# Patient Record
Sex: Female | Born: 1995 | Race: Black or African American | Hispanic: No | Marital: Single | Smoking: Current every day smoker
Health system: Southern US, Community
[De-identification: ages and names within clinical notes are randomized; demographics above are authoritative.]

## PROBLEM LIST (undated history)

## (undated) ENCOUNTER — Inpatient Hospital Stay (HOSPITAL_COMMUNITY): Payer: Self-pay

## (undated) DIAGNOSIS — Z789 Other specified health status: Secondary | ICD-10-CM

## (undated) DIAGNOSIS — F411 Generalized anxiety disorder: Secondary | ICD-10-CM

## (undated) DIAGNOSIS — F32A Depression, unspecified: Secondary | ICD-10-CM

## (undated) DIAGNOSIS — O24419 Gestational diabetes mellitus in pregnancy, unspecified control: Secondary | ICD-10-CM

## (undated) DIAGNOSIS — K2960 Other gastritis without bleeding: Secondary | ICD-10-CM

## (undated) DIAGNOSIS — N390 Urinary tract infection, site not specified: Secondary | ICD-10-CM

## (undated) DIAGNOSIS — S93401A Sprain of unspecified ligament of right ankle, initial encounter: Secondary | ICD-10-CM

## (undated) DIAGNOSIS — E282 Polycystic ovarian syndrome: Secondary | ICD-10-CM

## (undated) HISTORY — DX: Sprain of unspecified ligament of right ankle, initial encounter: S93.401A

## (undated) HISTORY — DX: Generalized anxiety disorder: F41.1

## (undated) HISTORY — DX: Other gastritis without bleeding: K29.60

## (undated) HISTORY — DX: Polycystic ovarian syndrome: E28.2

---
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
## 2016-01-04 ENCOUNTER — Encounter: Payer: Self-pay | Admitting: Pediatrics

## 2016-01-04 ENCOUNTER — Ambulatory Visit: Payer: Self-pay | Admitting: Pediatrics

## 2016-01-04 ENCOUNTER — Ambulatory Visit (INDEPENDENT_AMBULATORY_CARE_PROVIDER_SITE_OTHER): Payer: Medicaid Other | Admitting: Pediatrics

## 2016-01-04 VITALS — BP 120/77 | HR 91 | Ht 67.72 in | Wt 280.8 lb

## 2016-01-04 DIAGNOSIS — E282 Polycystic ovarian syndrome: Secondary | ICD-10-CM | POA: Diagnosis not present

## 2016-01-04 DIAGNOSIS — Z3046 Encounter for surveillance of implantable subdermal contraceptive: Secondary | ICD-10-CM | POA: Diagnosis not present

## 2016-01-04 DIAGNOSIS — E559 Vitamin D deficiency, unspecified: Secondary | ICD-10-CM | POA: Diagnosis not present

## 2016-01-04 DIAGNOSIS — Z30017 Encounter for initial prescription of implantable subdermal contraceptive: Secondary | ICD-10-CM

## 2016-01-04 DIAGNOSIS — R7303 Prediabetes: Secondary | ICD-10-CM | POA: Diagnosis not present

## 2016-01-04 DIAGNOSIS — Z3049 Encounter for surveillance of other contraceptives: Secondary | ICD-10-CM

## 2016-01-04 LAB — LIPID PANEL
CHOL/HDL RATIO: 4.4 ratio (ref <=5.0)
Cholesterol: 192 mg/dL — ABNORMAL HIGH (ref 125–170)
HDL: 44 mg/dL (ref 36–76)
LDL CALC: 116 mg/dL — AB (ref <110)
Triglycerides: 159 mg/dL — ABNORMAL HIGH (ref 40–136)
VLDL: 32 mg/dL — AB (ref <30)

## 2016-01-04 LAB — CBC
HEMATOCRIT: 38.6 % (ref 35.0–45.0)
HEMOGLOBIN: 12.7 g/dL (ref 11.7–15.5)
MCH: 28.2 pg (ref 27.0–33.0)
MCHC: 32.9 g/dL (ref 32.0–36.0)
MCV: 85.8 fL (ref 80.0–100.0)
MPV: 8.8 fL (ref 7.5–12.5)
Platelets: 286 10*3/uL (ref 140–400)
RBC: 4.5 MIL/uL (ref 3.80–5.10)
RDW: 15 % (ref 11.0–15.0)
WBC: 7.2 10*3/uL (ref 3.8–10.8)

## 2016-01-04 LAB — COMPREHENSIVE METABOLIC PANEL
ALT: 19 U/L (ref 5–32)
AST: 17 U/L (ref 12–32)
Albumin: 4 g/dL (ref 3.6–5.1)
Alkaline Phosphatase: 91 U/L (ref 47–176)
BUN: 13 mg/dL (ref 7–20)
CALCIUM: 9.3 mg/dL (ref 8.9–10.4)
CHLORIDE: 103 mmol/L (ref 98–110)
CO2: 26 mmol/L (ref 20–31)
Creat: 0.87 mg/dL (ref 0.50–1.00)
Glucose, Bld: 64 mg/dL — ABNORMAL LOW (ref 65–99)
POTASSIUM: 4.2 mmol/L (ref 3.8–5.1)
SODIUM: 138 mmol/L (ref 135–146)
TOTAL PROTEIN: 7.2 g/dL (ref 6.3–8.2)
Total Bilirubin: 0.2 mg/dL (ref 0.2–1.1)

## 2016-01-04 MED ORDER — ETONOGESTREL 68 MG ~~LOC~~ IMPL
68.0000 mg | DRUG_IMPLANT | Freq: Once | SUBCUTANEOUS | Status: AC
  Administered 2016-01-04: 68 mg via SUBCUTANEOUS

## 2016-01-04 NOTE — Procedures (Signed)
Risks & benefits of Nexplanon removal discussed. Consent form signed.  The patient denies any allergies to anesthetics or antiseptics.  Procedure: Pt was placed in supine position. left arm was flexed at the elbow and externally rotated so that her wrist was parallel to her ear, The device was palpated and marked. The site was cleaned with Betadine. The area surrounding the device was covered with a sterile drape. 1% lidocaine was injected just under the device. A scalpel was used to create a small incision. The device was pushed towards the incision. Fibrous tissue surrounding the device was gradually removed from the device. The device was removed and measured to ensure all 4 cm of device was removed.  Nexplanon Insertion  No contraindications for placement.  No liver disease, no unexplained vaginal bleeding, no h/o breast cancer, no h/o blood clots.  No LMP recorded. Patient has had an implant.  UHCG: Not done- remove and replace  Last Unprotected sex:  None  Risks & benefits of Nexplanon discussed The nexplanon device was purchased and supplied by [facility]. Packaging instructions supplied to patient Consent form signed  The patient denies any allergies to anesthetics or antiseptics.  Procedure: Pt was placed in supine position. The left arm was flexed at the elbow and externally rotated so that her wrist was parallel to her ear The medial epicondyle of the left arm was identified The insertions site was marked 8 cm proximal to the medial epicondyle The insertion site was cleaned with Betadine The area surrounding the insertion site was covered with a sterile drape 1% lidocaine was injected just under the skin at the insertion site extending 4 cm proximally. The sterile preloaded disposable Nexaplanon applicator was removed from the sterile packaging The applicator needle was inserted at a 30 degree angle at 8 cm proximal to the medial epicondyle as marked The applicator  was lowered to a horizontal position and advanced just under the skin for the full length of the needle The slider on the applicator was retracted fully while the applicator remained in the same position, then the applicator was removed. The implant was confirmed via palpation as being in position The implant position was demonstrated to the patient Pressure dressing was applied to the patient.  The patient was instructed to removed the pressure dressing in 24 hrs.  The patient was advised to move slowly from a supine to an upright position  The patient denied any concerns or complaints  The patient was instructed to schedule a follow-up appt in 1 month and to call sooner if any concerns.  The patient acknowledged agreement and understanding of the plan.

## 2016-01-04 NOTE — Patient Instructions (Addendum)
We drew labs today for your PCOS. In the past you were prediabetic so we will recheck this number today. It may be important for you to start taking your metformin again EVERY DAY. I will let you know. This could help with weight stabilization as well.      Congratulations on getting your Nexplanon placement!  Below is some important information about Nexplanon.  First remember that Nexplanon does not prevent sexually transmitted infections.  Condoms will help prevent sexually transmitted infections. The Nexplanon starts working 7 days after it was inserted.  There is a risk of getting pregnant if you have unprotected sex in those first 7 days after placement of the Nexplanon.  The Nexplanon lasts for 3 years but can be removed at any time.  You can become pregnant as early as 1 week after removal.  You can have a new Nexplanon put in after the old one is removed if you like.  It is not known whether Nexplanon is as effective in women who are very overweight because the studies did not include many overweight women.  Nexplanon interacts with some medications, including barbiturates, bosentan, carbamazepine, felbamate, griseofulvin, oxcarbazepine, phenytoin, rifampin, St. John's wort, topiramate, HIV medicines.  Please alert your doctor if you are on any of these medicines.  Always tell other healthcare providers that you have a Nexplanon in your arm.  The Nexplanon was placed just under the skin.  Leave the outside bandage on for 24 hours.  Leave the smaller bandage on for 3-5 days or until it falls off on its own.  Keep the area clean and dry for 3-5 days. There is usually bruising or swelling at the insertion site for a few days to a week after placement.  If you see redness or pus draining from the insertion site, call us immediately.  Keep your user card with the date the implant was placed and the date the implant is to be removed.  The most common side effect is a change in your  menstrual bleeding pattern.   This bleeding is generally not harmful to you but can be annoying.  Call or come in to see us if you have any concerns about the bleeding or if you have any side effects or questions.    We will call you in 1 week to check in and we would like you to return to the clinic for a follow-up visit in 1 month.  You can call [facility] 24 hours a day with any questions or concerns.  There is always a nurse or doctor available to take your call.  Call 9-1-1 if you have a life-threatening emergency.  For anything else, please call us at [phone] before heading to the ER.

## 2016-01-04 NOTE — Progress Notes (Signed)
THIS RECORD MAY CONTAIN CONFIDENTIAL INFORMATION THAT SHOULD NOT BE RELEASED WITHOUT REVIEW OF THE SERVICE PROVIDER.  Adolescent Medicine Consultation Follow-Up Visit Briana Lynch  is a 20 y.o. female referred by Kalman JewelsMcQueen, Shannon, MD here today for follow-up.    Previsit planning completed:  no  Growth Chart Viewed? yes   History was provided by the patient.  PCP Confirmed?  yes  My Chart Activated?   yes   HPI:    Patient presents for nexplanon removal and reinsertion. She does not currently have bleeding with nexplanon and is pleased with method. She is looking forward to having another. Procedure was completed without complications. See procedure note.   Patient also concerned that she can lose weight fairly quickly but then will gain it back. At times she has exercised and eaten well, at other times she hasn't changed much and her weight will fluctuate. She is only occasionally taking metformin.   Review of Systems  Constitutional: Negative for weight loss and malaise/fatigue.  Eyes: Negative for blurred vision.  Respiratory: Negative for shortness of breath.   Cardiovascular: Negative for chest pain and palpitations.  Gastrointestinal: Negative for nausea, vomiting, abdominal pain and constipation.  Genitourinary: Negative for dysuria.  Musculoskeletal: Negative for myalgias.  Neurological: Negative for dizziness and headaches.  Psychiatric/Behavioral: Negative for depression.     No LMP recorded. Patient has had an implant. Allergies  Allergen Reactions  . Penicillins Other (See Comments)    Unknown-pt's sister is allergic   Outpatient Prescriptions Prior to Visit  Medication Sig Dispense Refill  . etonogestrel (NEXPLANON) 68 MG IMPL implant Inject 1 each into the skin once.    . metFORMIN (GLUCOPHAGE-XR) 500 MG 24 hr tablet TAKE 3 TABLETS (1,000 MG TOTAL) BY MOUTH DAILY. 90 tablet 3   No facility-administered medications prior to visit.     Patient Active  Problem List   Diagnosis Date Noted  . 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 Iron deficiency anemia 10/19/2014  . Vitamin D deficiency 08/20/2014  . Prediabetes 08/20/2014  . Seborrhea capitis 05/18/2014  . PCOS (polycystic ovarian syndrome) 03/03/2013  . Presence of subdermal contraceptive device 03/03/2013  . Urinary incontinence 12/11/2012  . Irregular menses 12/09/2012  . Acne 12/09/2012  . Hirsutism 12/09/2012  . BMI (body mass index), pediatric, > 99% for age 62/27/2014    The following portions of the patient's history were reviewed and updated as appropriate: allergies, current medications, past family history, past medical history, past social history and problem list.  Physical Exam:  Filed Vitals:   01/04/16 1434  BP: 120/77  Pulse: 91  Height: 5' 7.72" (1.72 m)  Weight: 280 lb 12.8 oz (127.37 kg)   BP 120/77 mmHg  Pulse 91  Ht 5' 7.72" (1.72 m)  Wt 280 lb 12.8 oz (127.37 kg)  BMI 43.05 kg/m2 Body mass index: body mass index is 43.05 kg/(m^2). Blood pressure percentiles are 75% systolic and 84% diastolic based on 2000 NHANES data. Blood pressure percentile targets: 90: 126/80, 95: 130/84, 99 + 5 mmHg: 142/97.  Physical Exam  Constitutional: She is oriented to person, place, and time. She appears well-developed and well-nourished.  HENT:  Head: Normocephalic.  Neck: No thyromegaly present.  +1 acanthosis   Cardiovascular: Normal rate, regular rhythm, normal heart sounds and intact distal pulses.   Pulmonary/Chest: Effort normal and breath sounds normal.  Abdominal: Soft. Bowel sounds are normal. There is no tenderness.  Musculoskeletal: Normal range of motion.  Neurological: She is  alert and oriented to person, place, and time.  Skin: Skin is warm and dry.  Psychiatric: She has a normal mood and affect.    Assessment/Plan:  1. PCOS (polycystic ovarian syndrome) Will obtain labs today as patient hasn't had them done in a year. Will call or  send by my chart with plan, particularly regarding metformin.  - Comprehensive metabolic panel - Lipid panel - Hemoglobin A1c  2. Vitamin D deficiency Has taken replacement. Will recheck.  - VITAMIN D 25 Hydroxy (Vit-D Deficiency, Fractures)  3. Nexplanon removal See procedure note.   4. Nexplanon insertion See procedure note.  - etonogestrel (NEXPLANON) implant 68 mg; 68 mg by Subdermal route once. - Subdermal Etonogestrel Implant Insertion; Standing - Subdermal Etonogestrel Implant Insertion  5. Prediabetes Last A1C 6.1. Will recheck today and discuss ongoing need for metformin in the context of pre-DM and PCOS.  - CBC  PCOS Labs & Referrals:   - Hgba1c annually if normal, every 3 months if abnormal:  Due today - CMP annually if normal, as needed if abnormal:  Due today - CBC if on metformin, annually if normal, as needed if abnormal:  Due today - Lipid every 2 years if normal, annually if abnormal:  Due today - Vitamin D annually if normal, as needed if abnormal: Due today - Nutrition referral: Deferred    Follow-up:  6 months on Christmas break   Medical decision-making:  > 40 minutes spent, more than 50% of appointment was spent discussing diagnosis and management of symptoms

## 2016-01-05 LAB — HEMOGLOBIN A1C
Hgb A1c MFr Bld: 6.1 % — ABNORMAL HIGH (ref <5.7)
Mean Plasma Glucose: 128 mg/dL

## 2016-01-05 LAB — VITAMIN D 25 HYDROXY (VIT D DEFICIENCY, FRACTURES): Vit D, 25-Hydroxy: 19 ng/mL — ABNORMAL LOW (ref 30–100)

## 2016-02-09 ENCOUNTER — Encounter: Payer: Self-pay | Admitting: Pediatrics

## 2016-03-15 ENCOUNTER — Other Ambulatory Visit: Payer: Self-pay | Admitting: Pediatrics

## 2016-03-15 DIAGNOSIS — E282 Polycystic ovarian syndrome: Secondary | ICD-10-CM

## 2018-12-19 ENCOUNTER — Other Ambulatory Visit: Payer: Self-pay

## 2018-12-19 ENCOUNTER — Encounter (HOSPITAL_COMMUNITY): Payer: Self-pay | Admitting: *Deleted

## 2018-12-19 DIAGNOSIS — Z79899 Other long term (current) drug therapy: Secondary | ICD-10-CM | POA: Insufficient documentation

## 2018-12-19 DIAGNOSIS — A5901 Trichomonal vulvovaginitis: Secondary | ICD-10-CM | POA: Insufficient documentation

## 2018-12-19 DIAGNOSIS — Z7984 Long term (current) use of oral hypoglycemic drugs: Secondary | ICD-10-CM | POA: Insufficient documentation

## 2018-12-19 DIAGNOSIS — F17210 Nicotine dependence, cigarettes, uncomplicated: Secondary | ICD-10-CM | POA: Insufficient documentation

## 2018-12-19 DIAGNOSIS — N926 Irregular menstruation, unspecified: Secondary | ICD-10-CM | POA: Insufficient documentation

## 2018-12-19 NOTE — ED Notes (Addendum)
Pt not visualized in the lobby or out in front of the emergency department, and did not answer in the bathroom when being called for a room.

## 2018-12-19 NOTE — ED Triage Notes (Signed)
Pt states that for the past two months she has been experiencing lighter and irregular periods.  Normally her flow is heavy since her body normalized after nexplanon removal over a year ago.  Pt states that the blood is spotty at times and not a bright red color.  Pt reports heavier cramps associated with her period compared to before.  She had had two negative pregnancy tests since her symptoms began.

## 2018-12-19 NOTE — ED Notes (Signed)
Called for pt in the lobby with no answer.

## 2018-12-20 ENCOUNTER — Emergency Department (HOSPITAL_COMMUNITY)
Admission: EM | Admit: 2018-12-20 | Discharge: 2018-12-20 | Disposition: A | Discharge status: Home / Self Care | Payer: Self-pay | Attending: Emergency Medicine | Admitting: Emergency Medicine

## 2018-12-20 DIAGNOSIS — N926 Irregular menstruation, unspecified: Secondary | ICD-10-CM

## 2018-12-20 DIAGNOSIS — A5901 Trichomonal vulvovaginitis: Secondary | ICD-10-CM

## 2018-12-20 LAB — WET PREP, GENITAL
Clue Cells Wet Prep HPF POC: NONE SEEN
Sperm: NONE SEEN
Yeast Wet Prep HPF POC: NONE SEEN

## 2018-12-20 LAB — I-STAT BETA HCG BLOOD, ED (MC, WL, AP ONLY): I-stat hCG, quantitative: <5 m[IU]/mL (ref <5)

## 2018-12-20 MED ORDER — METRONIDAZOLE 500 MG PO TABS
2000.0000 mg | ORAL_TABLET | Freq: Once | ORAL | Status: AC
  Administered 2018-12-20: 2000 mg via ORAL
  Filled 2018-12-20: qty 4

## 2018-12-20 NOTE — ED Provider Notes (Signed)
[facility]-EMERGENCY DEPT Provider Note   CSN: [id number] Arrival date & time: 12/19/18  2150    History   Chief Complaint Chief Complaint  Patient presents with  . Vaginal Bleeding    Irregular    HPI Briana Lynch is a 23 y.o. female.     Patient presents with concern for irregular periods that started 2 months ago. She reports being on Nexplanon since the age of 23 that was removed last year. She had returned to a regular period for about 5 months until 2 months ago when her menses became shorter in duration and flow, as well as associated with intense cramping which is also unusual for her. She is currently spotting, that started on June 3rd and cramps have been intermittent. No vaginal discharge. She reports taking multiple pregnancy tests over the last few weeks that have been negative. No urinary symptoms.   The history is provided by the patient. No language interpreter was used.  Vaginal Bleeding  Associated symptoms: no abdominal pain, no dysuria, no fever, no nausea and no vaginal discharge     Past Medical History:  Diagnosis Date  . Ankle sprain and strain 11/15/2010  . Anxiety state 12/09/2012  . PCOS (polycystic ovarian syndrome)   . Right ankle sprain 11/15/2010    Patient Active Problem List   Diagnosis Date Noted  . 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 Iron deficiency anemia 10/19/2014  . Vitamin D deficiency 08/20/2014  . Prediabetes 08/20/2014  . Seborrhea capitis 05/18/2014  . PCOS (polycystic ovarian syndrome) 03/03/2013  . Presence of subdermal contraceptive device 03/03/2013  . Urinary incontinence 12/11/2012  . Irregular menses 12/09/2012  . Acne 12/09/2012  . Hirsutism 12/09/2012  . BMI (body mass index), pediatric, > 99% for age 61/27/2014    History reviewed. No pertinent surgical history.   OB History   No obstetric history on file.      Home Medications    Prior to Admission medications    Medication Sig Start Date End Date Taking? Authorizing Provider  etonogestrel (NEXPLANON) 68 MG IMPL implant Inject 1 each into the skin once.    [provider]  metFORMIN (GLUCOPHAGE-XR) 500 MG 24 hr tablet TAKE 3 TABLETS EVERY DAY 03/16/16   Georges MouseJones, Christy M, NP    Family History Family History  Problem Relation Age of Onset  . Diabetes Mother   . Hypertension Mother   . Mental illness Sister     Social History Social History   Tobacco Use  . Smoking status: Current Some Day Smoker    Packs/day: 1.00    Types: Cigarettes  . Smokeless tobacco: Never Used  . Tobacco comment: Dad smokes  Substance Use Topics  . Alcohol use: Not on file  . Drug use: Yes    Types: Marijuana    Comment: infrequent     Allergies   Penicillins   Review of Systems Review of Systems  Constitutional: Negative for fever.  Gastrointestinal: Negative for abdominal pain, nausea and vomiting.  Genitourinary: Positive for menstrual problem, pelvic pain (menstrual cramping) and vaginal bleeding. Negative for dysuria and vaginal discharge.  Musculoskeletal: Negative for myalgias.  Neurological: Negative for weakness.     Physical Exam Updated Vital Signs BP 113/64 (BP Location: Right Arm)   Pulse 71   Temp 98.7 F (37.1 C) (Oral)   Resp 18   Ht 5\' 8"  (1.727 m)   Wt 103 kg   LMP 12/17/2018  SpO2 100%   BMI 34.52 kg/m   Physical Exam Constitutional:      General: She is not in acute distress.    Appearance: She is not ill-appearing.  Cardiovascular:     Rate and Rhythm: Normal rate.  Pulmonary:     Effort: Pulmonary effort is normal.  Genitourinary:    Comments: There is scant cervical bleeding. No abnormal appearance of the cervix and no tenderness.  Skin:    General: Skin is warm and dry.  Neurological:     Mental Status: She is alert and oriented to person, place, and time.      ED Treatments / Results  Labs (all labs ordered are listed, but only abnormal  results are displayed) Labs Reviewed  WET PREP, GENITAL  I-STAT BETA HCG BLOOD, ED ([facility], [facility], [facility] ONLY)  GC/CHLAMYDIA PROBE AMP ([place]) NOT AT [facility]    EKG None  Radiology No results found.  Procedures Procedures (including critical care time)  Medications Ordered in ED Medications - No data to display   Initial Impression / Assessment and Plan / ED Course  I have reviewed the triage vital signs and the nursing notes.  Pertinent labs & imaging results that were available during my care of the patient were reviewed by me and considered in my medical decision making (see chart for details).        Patient to ED for evaluation of irregular menses and cramping.   She is very well appearing. Denies symptoms of illness. Pelvic exam is unremarkable for anything but small amount of cervical bleeding without CMT. Do not feel infection is present as the cause of irregularity. She is positive for trich on her wet prep. This was treated in the ED with Flagyl.   She can be discharged home with GYN f/u for further evaluation and routine GYN health.  Final Clinical Impressions(s) / ED Diagnoses   Final diagnoses:  None   1. Irregular menses 2. Trichomonas vaginitis  ED Discharge Orders    None       Charlann Lange, PA-C 12/20/18 Cogswell, Delice Bison, DO 12/20/18 [phone]

## 2018-12-20 NOTE — Discharge Instructions (Addendum)
Your tests show a trichomonas infection which has been treated in the emergency department  Please call the [facility] or a gynecologist of your choice for further evaluation of irregular periods and for routine gynecologic health.

## 2018-12-22 LAB — GC/CHLAMYDIA PROBE AMP (~~LOC~~) NOT AT ARMC
Chlamydia: NEGATIVE
Neisseria Gonorrhea: NEGATIVE

## 2019-01-03 ENCOUNTER — Encounter (HOSPITAL_COMMUNITY): Payer: Self-pay

## 2019-01-03 ENCOUNTER — Other Ambulatory Visit: Payer: Self-pay

## 2019-01-03 ENCOUNTER — Emergency Department (HOSPITAL_COMMUNITY)
Admission: EM | Admit: 2019-01-03 | Discharge: 2019-01-04 | Disposition: A | Discharge status: Other Acute Inpatient Hospital | Payer: Self-pay | Attending: Emergency Medicine | Admitting: Emergency Medicine

## 2019-01-03 DIAGNOSIS — R7303 Prediabetes: Secondary | ICD-10-CM | POA: Insufficient documentation

## 2019-01-03 DIAGNOSIS — Z1159 Encounter for screening for other viral diseases: Secondary | ICD-10-CM | POA: Insufficient documentation

## 2019-01-03 DIAGNOSIS — Z7984 Long term (current) use of oral hypoglycemic drugs: Secondary | ICD-10-CM | POA: Insufficient documentation

## 2019-01-03 DIAGNOSIS — F4312 Post-traumatic stress disorder, chronic: Secondary | ICD-10-CM | POA: Insufficient documentation

## 2019-01-03 DIAGNOSIS — F17210 Nicotine dependence, cigarettes, uncomplicated: Secondary | ICD-10-CM | POA: Insufficient documentation

## 2019-01-03 DIAGNOSIS — R4182 Altered mental status, unspecified: Secondary | ICD-10-CM | POA: Insufficient documentation

## 2019-01-03 DIAGNOSIS — F29 Unspecified psychosis not due to a substance or known physiological condition: Secondary | ICD-10-CM

## 2019-01-03 DIAGNOSIS — F4481 Dissociative identity disorder: Secondary | ICD-10-CM | POA: Insufficient documentation

## 2019-01-03 LAB — COMPREHENSIVE METABOLIC PANEL
ALT: 19 U/L (ref 0–44)
AST: 18 U/L (ref 15–41)
Albumin: 5 g/dL (ref 3.5–5.0)
Alkaline Phosphatase: 81 U/L (ref 38–126)
Anion gap: 11 (ref 5–15)
BUN: 7 mg/dL (ref 6–20)
CO2: 26 mmol/L (ref 22–32)
Calcium: 10 mg/dL (ref 8.9–10.3)
Chloride: 102 mmol/L (ref 98–111)
Creatinine, Ser: 0.86 mg/dL (ref 0.44–1.00)
GFR calc Af Amer: >60 mL/min (ref >60)
GFR calc non Af Amer: >60 mL/min (ref >60)
Glucose, Bld: 106 mg/dL — ABNORMAL HIGH (ref 70–99)
Potassium: 3.6 mmol/L (ref 3.5–5.1)
Sodium: 139 mmol/L (ref 135–145)
Total Bilirubin: 0.2 mg/dL — ABNORMAL LOW (ref 0.3–1.2)
Total Protein: 8.8 g/dL — ABNORMAL HIGH (ref 6.5–8.1)

## 2019-01-03 LAB — RAPID URINE DRUG SCREEN, HOSP PERFORMED
Amphetamines: NOT DETECTED
Barbiturates: NOT DETECTED
Benzodiazepines: NOT DETECTED
Cocaine: NOT DETECTED
Opiates: NOT DETECTED
Tetrahydrocannabinol: POSITIVE — AB

## 2019-01-03 LAB — CBC
HCT: 44.6 % (ref 36.0–46.0)
Hemoglobin: 14.2 g/dL (ref 12.0–15.0)
MCH: 29.3 pg (ref 26.0–34.0)
MCHC: 31.8 g/dL (ref 30.0–36.0)
MCV: 92.1 fL (ref 80.0–100.0)
Platelets: 276 10*3/uL (ref 150–400)
RBC: 4.84 MIL/uL (ref 3.87–5.11)
RDW: 13.8 % (ref 11.5–15.5)
WBC: 8.1 10*3/uL (ref 4.0–10.5)
nRBC: 0 % (ref 0.0–0.2)

## 2019-01-03 LAB — ACETAMINOPHEN LEVEL: Acetaminophen (Tylenol), Serum: <10 ug/mL — ABNORMAL LOW (ref 10–30)

## 2019-01-03 LAB — ETHANOL: Alcohol, Ethyl (B): <10 mg/dL (ref <10)

## 2019-01-03 LAB — SARS CORONAVIRUS 2 BY RT PCR (HOSPITAL ORDER, PERFORMED IN ~~LOC~~ HOSPITAL LAB): SARS Coronavirus 2: NEGATIVE

## 2019-01-03 LAB — I-STAT BETA HCG BLOOD, ED (MC, WL, AP ONLY): I-stat hCG, quantitative: <5 m[IU]/mL (ref <5)

## 2019-01-03 LAB — SALICYLATE LEVEL: Salicylate Lvl: <7 mg/dL (ref 2.8–30.0)

## 2019-01-03 NOTE — ED Triage Notes (Signed)
Patient arrives with 2 [employer] officers under IVC for threatening Mother-stating that it isn't her Mother and she is "Briana Lynch". Patient has been identified with picture and SS#. Patient states she has been living with her Grandmother.
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
## 2019-01-03 NOTE — ED Notes (Signed)
Received Briana Lynch from triage alert and refusing to change into scrubs after several attempts. She was demanding to speaks with a doctor and stated her name is Briana Lynch. Security was called and with assistance by female staff she was undressed without incident and dressed into scrubs. She was cooperative with the lab work and eventually gave a urine specimen. She was given water to drink and her personal needs were addressed. She talked with TTS and will be transferred to [facility] by [facility]. Report was called to nurse Kennyth Lose and [employer] was notified. She was transferred at 0028 hrs without incident.

## 2019-01-03 NOTE — BH Assessment (Signed)
[place] Assessment Progress Note      Once UDS has been completed, patient can be admitted to [facility] 502-1.  Darlyne Russian, PA accepting and Dr. Jake Samples will be attending.  RN report will need to be called to [phone] prior to her arrival to the unit.

## 2019-01-04 ENCOUNTER — Other Ambulatory Visit: Payer: Self-pay

## 2019-01-04 ENCOUNTER — Encounter (HOSPITAL_COMMUNITY): Payer: Self-pay

## 2019-01-04 ENCOUNTER — Inpatient Hospital Stay (HOSPITAL_COMMUNITY)
Admission: AD | Admit: 2019-01-04 | Discharge: 2019-01-09 | DRG: 885 | Disposition: A | Discharge status: Home / Self Care | Payer: BC Managed Care – PPO | Source: Intra-hospital | Attending: Psychiatry | Admitting: Psychiatry

## 2019-01-04 DIAGNOSIS — F411 Generalized anxiety disorder: Secondary | ICD-10-CM | POA: Diagnosis present

## 2019-01-04 DIAGNOSIS — F4481 Dissociative identity disorder: Secondary | ICD-10-CM | POA: Diagnosis present

## 2019-01-04 DIAGNOSIS — F17210 Nicotine dependence, cigarettes, uncomplicated: Secondary | ICD-10-CM | POA: Diagnosis present

## 2019-01-04 DIAGNOSIS — Z7984 Long term (current) use of oral hypoglycemic drugs: Secondary | ICD-10-CM | POA: Diagnosis not present

## 2019-01-04 DIAGNOSIS — Z8249 Family history of ischemic heart disease and other diseases of the circulatory system: Secondary | ICD-10-CM | POA: Diagnosis not present

## 2019-01-04 DIAGNOSIS — F200 Paranoid schizophrenia: Secondary | ICD-10-CM | POA: Diagnosis not present

## 2019-01-04 DIAGNOSIS — Z818 Family history of other mental and behavioral disorders: Secondary | ICD-10-CM

## 2019-01-04 DIAGNOSIS — F29 Unspecified psychosis not due to a substance or known physiological condition: Principal | ICD-10-CM | POA: Diagnosis present

## 2019-01-04 DIAGNOSIS — F449 Dissociative and conversion disorder, unspecified: Secondary | ICD-10-CM | POA: Diagnosis not present

## 2019-01-04 DIAGNOSIS — F1220 Cannabis dependence, uncomplicated: Secondary | ICD-10-CM | POA: Diagnosis present

## 2019-01-04 DIAGNOSIS — E282 Polycystic ovarian syndrome: Secondary | ICD-10-CM | POA: Diagnosis present

## 2019-01-04 DIAGNOSIS — Z833 Family history of diabetes mellitus: Secondary | ICD-10-CM

## 2019-01-04 DIAGNOSIS — Z91410 Personal history of adult physical and sexual abuse: Secondary | ICD-10-CM

## 2019-01-04 DIAGNOSIS — F4312 Post-traumatic stress disorder, chronic: Secondary | ICD-10-CM | POA: Diagnosis present

## 2019-01-04 DIAGNOSIS — G4700 Insomnia, unspecified: Secondary | ICD-10-CM | POA: Diagnosis present

## 2019-01-04 DIAGNOSIS — F4310 Post-traumatic stress disorder, unspecified: Secondary | ICD-10-CM | POA: Diagnosis present

## 2019-01-04 HISTORY — DX: Other specified health status: Z78.9

## 2019-01-04 MED ORDER — ALUM & MAG HYDROXIDE-SIMETH 200-200-20 MG/5ML PO SUSP: 30.0000 mL | ORAL | Status: DC | PRN

## 2019-01-04 MED ORDER — NICOTINE POLACRILEX 2 MG MT GUM: 2.0000 mg | CHEWING_GUM | OROMUCOSAL | Status: DC | PRN

## 2019-01-04 MED ORDER — TRAZODONE HCL 50 MG PO TABS: 50.0000 mg | ORAL_TABLET | Freq: Every evening | ORAL | Status: DC | PRN

## 2019-01-04 MED ORDER — HYDROXYZINE HCL 25 MG PO TABS: 25.0000 mg | ORAL_TABLET | Freq: Three times a day (TID) | ORAL | Status: DC | PRN

## 2019-01-04 MED ORDER — ACETAMINOPHEN 325 MG PO TABS: 650.0000 mg | ORAL_TABLET | Freq: Four times a day (QID) | ORAL | Status: DC | PRN

## 2019-01-04 MED ORDER — MAGNESIUM HYDROXIDE 400 MG/5ML PO SUSP: 30.0000 mL | Freq: Every day | ORAL | Status: DC | PRN

## 2019-01-04 MED ORDER — RISPERIDONE 1 MG PO TABS
1.0000 mg | ORAL_TABLET | Freq: Every day | ORAL | Status: DC
  Filled 2019-01-04 (×2): qty 1

## 2019-01-04 MED ORDER — OMEGA-3-ACID ETHYL ESTERS 1 G PO CAPS
1.0000 g | ORAL_CAPSULE | Freq: Two times a day (BID) | ORAL | Status: DC
  Administered 2019-01-06 – 2019-01-09 (×6): 1 g via ORAL
  Filled 2019-01-04 (×12): qty 1

## 2019-01-04 MED ORDER — RISPERIDONE 0.5 MG PO TABS
0.5000 mg | ORAL_TABLET | Freq: Every day | ORAL | Status: DC
  Filled 2019-01-04 (×3): qty 1
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
## 2019-01-04 NOTE — BHH Group Notes (Signed)
[facility] LCSW Group Therapy Note  Date/Time:  01/04/2019  11:00AM-12:00PM  Type of Therapy and Topic:  Group Therapy:  Music and Mood  Participation Level:  Active   Description of Group: In this process group, members listened to a variety of genres of music and identified that different types of music evoke different responses.  Patients were encouraged to identify music that was soothing for them and music that was energizing for them.  Patients discussed how this knowledge can help with wellness and recovery in various ways including managing depression and anxiety as well as encouraging healthy sleep habits.    Therapeutic Goals: 1. Patients will explore the impact of different varieties of music on mood 2. Patients will verbalize the thoughts they have when listening to different types of music 3. Patients will identify music that is soothing to them as well as music that is energizing to them 4. Patients will discuss how to use this knowledge to assist in maintaining wellness and recovery 5. Patients will explore the use of music as a coping skill  Summary of Patient Progress:  At the beginning of group, patient expressed discomfort with music being played when she did not know what she was supposed to do.  Group was short today, only about 20 minutes.  She had a lot of questions and did not appear to like any of the music chosen today.  However, at the end of group she verbalized thanks in a cheerful manner that was incongruent with how she acted throughout group.  Therapeutic Modalities: Solution Focused Brief Therapy Activity   Selmer Dominion, LCSW

## 2019-01-04 NOTE — Plan of Care (Signed)
Nurse discussed anxiety, depression and coping skills with patient.  

## 2019-01-04 NOTE — BHH Counselor (Signed)
Clinical Social Work Note  Contact made with Mother Rutherford Guys [phone], who stated that patient has not been on any psychiatric medications for 1 year.  She was only on a cream for a yeast infection recently.  In the past patient has taken Risperdal which helped with her symptoms, but she stopped taking it because of the way it made her feel "outside of herself" and because she thought the difficult situation she had gone through caused her problems, was in the past, and now she was okay.  Of note, there is a significant family history of mental illness including paternal uncle with Schizophrenia and paternal grandmother with Bipolar disorder.  Time was taken to educate mother about the possible diagnoses being considered by doctor.  We talked at length about how she can handle the issue of the patient being paranoid about her own mother and believing her mother is a stranger who is stalking her.  CSW emphasized the need to convey love and calmness, and to not support the delusion while at the same time not confronting it.  Mother expressed understanding.  She will appreciate any updates from weekday social worker as they are available.  Selmer Dominion, LCSW 01/04/2019, 3:46 PM
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
## 2019-01-04 NOTE — Tx Team (Signed)
Initial Treatment Plan 01/04/2019 2:12 AM Briana Lynch MBE:[id number]    PATIENT STRESSORS: Traumatic event   PATIENT STRENGTHS: Active sense of humor Average or above average intelligence Supportive family/friends   PATIENT IDENTIFIED PROBLEMS: Psychosis                     DISCHARGE CRITERIA:  Improved stabilization in mood, thinking, and/or behavior  PRELIMINARY DISCHARGE PLAN: Return to previous living arrangement  PATIENT/FAMILY INVOLVEMENT: This treatment plan has been presented to and reviewed with the patient, Briana Lynch, and/or family member, The patient and family have been given the opportunity to ask questions and make suggestions.  Aurora Mask, RN 01/04/2019, 2:12 AM
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
## 2019-01-04 NOTE — [facility] Suicide Risk Assessment (Signed)
[facility] INPATIENT:  Family/Significant Other Suicide Prevention Education  Suicide Prevention Education:  Education Completed; Mother Rutherford Guys [phone],  (name of family member/significant other) has been identified by the patient as the family member/significant other with whom the patient will be residing, and identified as the person(s) who will aid the patient in the event of a mental health crisis (suicidal ideations/suicide attempt).  With written consent from the patient, the family member/significant other has been provided the following suicide prevention education, prior to the and/or following the discharge of the patient.  The suicide prevention education provided includes the following:  Suicide risk factors  Suicide prevention and interventions  National Suicide Hotline telephone number  [facility] assessment telephone number  [facility] Emergency Assistance [place] and/or Residential Mobile Crisis Unit telephone number  Request made of family/significant other to:  Remove weapons (e.g., guns, rifles, knives), all items previously/currently identified as safety concern.    Remove drugs/medications (over-the-counter, prescriptions, illicit drugs), all items previously/currently identified as a safety concern.  The family member/significant other verbalizes understanding of the suicide prevention education information provided.  The family member/significant other agrees to remove the items of safety concern listed above.  Berlin Hun Grossman-Orr 01/04/2019, 3:45 PM

## 2019-01-05 DIAGNOSIS — F200 Paranoid schizophrenia: Secondary | ICD-10-CM

## 2019-01-05 MED ORDER — LORAZEPAM 2 MG/ML IJ SOLN: 2.0000 mg | INTRAMUSCULAR | Status: DC | PRN

## 2019-01-05 MED ORDER — ZIPRASIDONE MESYLATE 20 MG IM SOLR
INTRAMUSCULAR | Status: AC
  Filled 2019-01-05: qty 20

## 2019-01-05 MED ORDER — CLONAZEPAM 0.5 MG PO TABS
0.5000 mg | ORAL_TABLET | Freq: Two times a day (BID) | ORAL | Status: AC
  Filled 2019-01-05: qty 1

## 2019-01-05 MED ORDER — LORAZEPAM 2 MG/ML IJ SOLN
4.0000 mg | Freq: Once | INTRAMUSCULAR | Status: AC
  Administered 2019-01-05: 4 mg via INTRAMUSCULAR

## 2019-01-05 MED ORDER — HALOPERIDOL 5 MG PO TABS: 5.0000 mg | ORAL_TABLET | Freq: Four times a day (QID) | ORAL | Status: DC | PRN

## 2019-01-05 MED ORDER — RISPERIDONE 2 MG PO TABS
2.0000 mg | ORAL_TABLET | Freq: Every day | ORAL | Status: DC
  Administered 2019-01-06 – 2019-01-09 (×4): 2 mg via ORAL
  Filled 2019-01-05 (×5): qty 1

## 2019-01-05 MED ORDER — TEMAZEPAM 30 MG PO CAPS
30.0000 mg | ORAL_CAPSULE | Freq: Every day | ORAL | Status: DC
  Administered 2019-01-06 – 2019-01-08 (×3): 30 mg via ORAL
  Filled 2019-01-05 (×3): qty 1
  Filled 2019-01-05: qty 2
  Filled 2019-01-05: qty 1

## 2019-01-05 MED ORDER — HALOPERIDOL LACTATE 5 MG/ML IJ SOLN: 10.0000 mg | Freq: Four times a day (QID) | INTRAMUSCULAR | Status: DC | PRN

## 2019-01-05 MED ORDER — ZIPRASIDONE MESYLATE 20 MG IM SOLR
20.0000 mg | Freq: Once | INTRAMUSCULAR | Status: AC
  Administered 2019-01-05: 11:00:00 20 mg via INTRAMUSCULAR
  Filled 2019-01-05: qty 20

## 2019-01-05 MED ORDER — RISPERIDONE 2 MG PO TABS
4.0000 mg | ORAL_TABLET | Freq: Every day | ORAL | Status: DC
  Administered 2019-01-06 – 2019-01-08 (×3): 4 mg via ORAL
  Filled 2019-01-05 (×5): qty 2

## 2019-01-05 MED ORDER — LORAZEPAM 1 MG PO TABS: 2.0000 mg | ORAL_TABLET | ORAL | Status: DC | PRN

## 2019-01-05 MED ORDER — BENZTROPINE MESYLATE 0.5 MG PO TABS
0.5000 mg | ORAL_TABLET | Freq: Two times a day (BID) | ORAL | Status: DC
  Administered 2019-01-06 – 2019-01-09 (×7): 0.5 mg via ORAL
  Filled 2019-01-05 (×11): qty 1

## 2019-01-05 MED ORDER — OMEGA-3-ACID ETHYL ESTERS 1 G PO CAPS
1.0000 g | ORAL_CAPSULE | Freq: Two times a day (BID) | ORAL | Status: DC
  Administered 2019-01-06: 1 g via ORAL
  Filled 2019-01-05 (×5): qty 1

## 2019-01-05 MED ORDER — LORAZEPAM 2 MG/ML IJ SOLN
INTRAMUSCULAR | Status: AC
  Filled 2019-01-05: qty 2

## 2019-01-05 NOTE — Progress Notes (Addendum)
Patient was shown ID that her mother dropped off. Patient said "that's not me. I don't know what you want me to say."  Patient was told the ID will be locked up in her locker.

## 2019-01-05 NOTE — Progress Notes (Addendum)
Patient's mother was in the lobby and was asking to speak with the patient's nurse. Patient's mother also brought a form of ID to give to the patient to prove that "Briana Lynch" is her legal last name. Mom was updated on the situation and was agreeable to our actions. ID will be placed in her locker. Patient's current state of mind is not receptive to receiving information about her identity.
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
## 2019-01-05 NOTE — Progress Notes (Signed)
[facility] Second Physician Opinion Progress Note for Medication Administration to Non-consenting Patients (For Involuntarily Committed Patients)  Patient: Briana Lynch Date of Birth: [birth year] MRN: [id number]  Reason for the Medication: There is, without the benefit of the specific treatment measure, a significant possibility that the patient will harm self or others before improvement of the patient's condition is realized.  Consideration of Side Effects: Consideration of the side effects related to the medication plan has been given.  Rationale for Medication Administration: Patient is seen and examined. Patient is a 23 year old female with a reported past psychiatric history significant for posttraumatic stress disorder and dissociative identity disorder who presented to the [facility] emergency department on 01/03/2019 under involuntary commitment. Reportedly the patient had been threatening to kill her self as well as others. She also had reportedly been hallucinating. The patient stated that she is unsure who "these people think I am" because she stated that she is not Herbert Deaner, but is Tanya Nones.  I am familiar with this patient because I admitted her on 01/04/2019.  Dr. Jake Samples requested that I examined the patient for consideration forced medications.  The patient remains significantly ill, and has been refusing oral medications.  Her behavior has been disruptive to the unit.  It is felt that by leaving her unmedicated that this will lead to more debility and severe disability secondary to her illness.  I agree with Dr. Jake Samples with the need for forced medications.    Sharma Covert, MD 01/05/19  12:18 PM   This documentation is good for (7) seven days from the date of the MD signature. New documentation must be completed every seven (7) days with detailed justification in the medical record if the patient requires continued non-emergent administration of psychotropic  medications.

## 2019-01-05 NOTE — Progress Notes (Signed)
[facility] MD Progress Note  01/05/2019 8:16 AM Briana Lynch  MRN:  [id number] Subjective:   Patient seen she continues to repeat the same story that she is not Briana Lynch but is Briana Lynch continues to insist that the woman harassing her is not her mother so forth Rambles, pressured, self agitating. 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 Ankle sprain and strain 11/15/2010  . Anxiety state 12/09/2012  . Medical history non-contributory   . PCOS (polycystic ovarian syndrome)   . Right ankle sprain 11/15/2010   History reviewed. No pertinent surgical history. Family History:  Family History  Problem Relation Age of Onset  . Diabetes Mother   . Hypertension Mother   . Mental illness Sister    Family Psychiatric  History: paternal uncle with Schizophrenia and paternal grandmother with Bipolar disorder. Social History:  Social History   Substance and Sexual Activity  Alcohol Use Not Currently  . Alcohol/week: 0.0 standard drinks     Social History   Substance and Sexual Activity  Drug Use Yes  . Types: Marijuana   Comment: infrequent    Social History   Socioeconomic History  . Marital status: Single    Spouse name: Not on file  . Number of children: Not on file  . Years of education: Not on file  . Highest education level: 12th grade  Occupational History  . Not on file  Social Needs  . Financial resource strain: Not on file  . Food insecurity    Worry: Patient refused    Inability: Patient refused  . Transportation needs    Medical: Patient refused    Non-medical: Patient refused  Tobacco Use  . Smoking status: Current Some Day Smoker    Packs/day: 1.00    Types:  Cigarettes  . Smokeless tobacco: Never Used  . Tobacco comment: Dad smokes  Substance and Sexual Activity  . Alcohol use: Not Currently    Alcohol/week: 0.0 standard drinks  . Drug use: Yes    Types: Marijuana    Comment: infrequent  . Sexual activity: Not Currently    Birth control/protection: Injection  Lifestyle  . Physical activity    Days per week: Patient refused    Minutes per session: Patient refused  . Stress: Not on file  Relationships  . Social Musicianconnections    Talks on phone: Patient refused    Gets together: Patient refused    Attends religious service: Patient refused    Active member of club or organization: Patient refused    Attends meetings of clubs or organizations: Patient refused    Relationship status: Patient refused  Other Topics Concern  . Not on file  Social History Narrative   2014:Sophomore in high school, taking AP classes, enjoys school manages basketball, football and track teams   Additional Social History:                         Sleep: Poor  Appetite:  Good  Current Medications: Current Facility-Administered Medications  Medication Dose Route Frequency Provider Last Rate Last Dose  . acetaminophen (TYLENOL) tablet 650 mg  650 mg Oral Q6H PRN Antonieta Pertlary, Greg Lawson, MD      . alum & Jodelle Greenmag  hydroxide-simeth (MAALOX/MYLANTA) 200-200-20 MG/5ML suspension 30 mL  30 mL Oral Q4H PRN Sharma Covert, MD      . benztropine (COGENTIN) tablet 0.5 mg  0.5 mg Oral BID Johnn Hai, MD      . clonazePAM Bobbye Charleston) tablet 0.5 mg  0.5 mg Oral BID Johnn Hai, MD      . hydrOXYzine (ATARAX/VISTARIL) tablet 25 mg  25 mg Oral TID PRN Sharma Covert, MD      . magnesium hydroxide (MILK OF MAGNESIA) suspension 30 mL  30 mL Oral Daily PRN Sharma Covert, MD      . nicotine polacrilex (NICORETTE) gum 2 mg  2 mg Oral PRN Sharma Covert, MD      . omega-3 acid ethyl esters (LOVAZA) capsule 1 g  1 g Oral BID Sharma Covert, MD      . omega-3  acid ethyl esters (LOVAZA) capsule 1 g  1 g Oral BID Johnn Hai, MD      . Derrill Memo ON 01/06/2019] risperiDONE (RISPERDAL) tablet 2 mg  2 mg Oral Daily Johnn Hai, MD      . risperiDONE (RISPERDAL) tablet 4 mg  4 mg Oral QHS Johnn Hai, MD      . temazepam (RESTORIL) capsule 30 mg  30 mg Oral QHS Johnn Hai, MD      . traZODone (DESYREL) tablet 50 mg  50 mg Oral QHS PRN Sharma Covert, MD        Lab Results:  Results for orders placed or performed during the hospital encounter of 01/03/19 (from the past 48 hour(s))  Rapid urine drug screen (hospital performed)     Status: Abnormal   Collection Time: 01/03/19  7:47 PM  Result Value Ref Range   Opiates NONE DETECTED NONE DETECTED   Cocaine NONE DETECTED NONE DETECTED   Benzodiazepines NONE DETECTED NONE DETECTED   Amphetamines NONE DETECTED NONE DETECTED   Tetrahydrocannabinol POSITIVE (A) NONE DETECTED   Barbiturates NONE DETECTED NONE DETECTED    Comment: (NOTE) DRUG SCREEN FOR MEDICAL PURPOSES ONLY.  IF CONFIRMATION IS NEEDED FOR ANY PURPOSE, NOTIFY LAB WITHIN 5 DAYS. LOWEST DETECTABLE LIMITS FOR URINE DRUG SCREEN Drug Class                     Cutoff (ng/mL) Amphetamine and metabolites    1000 Barbiturate and metabolites    200 Benzodiazepine                 354 Tricyclics and metabolites     300 Opiates and metabolites        300 Cocaine and metabolites        300 THC                            50 Performed at [facility], [place] [address], [place], [place] [address]   Comprehensive metabolic panel     Status: Abnormal   Collection Time: 01/03/19  8:22 PM  Result Value Ref Range   Sodium 139 135 - 145 mmol/L   Potassium 3.6 3.5 - 5.1 mmol/L   Chloride 102 98 - 111 mmol/L   CO2 26 22 - 32 mmol/L   Glucose, Bld 106 (H) 70 - 99 mg/dL   BUN 7 6 - 20 mg/dL   Creatinine, Ser 0.86 0.44 - 1.00 mg/dL   Calcium 10.0 8.9 - 10.3 mg/dL   Total Protein 8.8 (H) 6.5 -  8.1 g/dL   Albumin 5.0 3.5 - 5.0 g/dL    AST 18 15 - 41 U/L   ALT 19 0 - 44 U/L   Alkaline Phosphatase 81 38 - 126 U/L   Total Bilirubin 0.2 (L) 0.3 - 1.2 mg/dL   GFR calc non Af Amer >60 >60 mL/min   GFR calc Af Amer >60 >60 mL/min   Anion gap 11 5 - 15    Comment: Performed at [facility], [address] [place], [place] [address]  Ethanol     Status: None   Collection Time: 01/03/19  8:22 PM  Result Value Ref Range   Alcohol, Ethyl (B) <10 <10 mg/dL    Comment: (NOTE) Lowest detectable limit for serum alcohol is 10 mg/dL. For medical purposes only. Performed at [facility], [address] [place], [place] [address]   Salicylate level     Status: None   Collection Time: 01/03/19  8:22 PM  Result Value Ref Range   Salicylate Lvl <7.0 2.8 - 30.0 mg/dL    Comment: Performed at [facility], [address] [place], [place] [address]  Acetaminophen level     Status: Abnormal   Collection Time: 01/03/19  8:22 PM  Result Value Ref Range   Acetaminophen (Tylenol), Serum <10 (L) 10 - 30 ug/mL    Comment: (NOTE) Therapeutic concentrations vary significantly. A range of 10-30 ug/mL  may be an effective concentration for many patients. However, some  are best treated at concentrations outside of this range. Acetaminophen concentrations >150 ug/mL at 4 hours after ingestion  and >50 ug/mL at 12 hours after ingestion are often associated with  toxic reactions. Performed at [facility], [address] [place], [place] [address]   cbc     Status: None   Collection Time: 01/03/19  8:22 PM  Result Value Ref Range   WBC 8.1 4.0 - 10.5 K/uL   RBC 4.84 3.87 - 5.11 MIL/uL   Hemoglobin 14.2 12.0 - 15.0 g/dL   HCT 29.544.6 62.136.0 - 30.846.0 %   MCV 92.1 80.0 - 100.0 fL   MCH 29.3 26.0 - 34.0 pg   MCHC 31.8 30.0 - 36.0 g/dL   RDW 65.713.8 84.611.5 - 96.215.5 %   Platelets 276 150 - 400 K/uL   nRBC 0.0 0.0 - 0.2 %    Comment: Performed at [facility], [address] [place], [place] [address]  SARS Coronavirus 2 (CEPHEID - Performed in [facility] hospital lab), Hosp Order     Status: None   Collection Time: 01/03/19  8:22 PM   Specimen: Nasopharyngeal Swab  Result Value Ref Range   SARS Coronavirus 2 NEGATIVE NEGATIVE    Comment: (NOTE) If result is NEGATIVE SARS-CoV-2 target nucleic acids are NOT DETECTED. The SARS-CoV-2 RNA is generally detectable in upper and lower  respiratory specimens during the acute phase of infection. The lowest  concentration of SARS-CoV-2 viral copies this assay can detect is 250  copies / mL. A negative result does not preclude SARS-CoV-2 infection  and should not be used as the sole basis for treatment or other  patient management decisions.  A negative result may occur with  improper specimen collection / handling, submission of specimen other  than nasopharyngeal swab, presence of viral mutation(s) within the  areas targeted by this assay, and inadequate number of viral copies  (<250 copies / mL). A negative result must be combined with  clinical  observations, patient history, and epidemiological information. If result is POSITIVE SARS-CoV-2 target nucleic acids are DETECTED. The SARS-CoV-2 RNA is generally detectable in upper and lower  respiratory specimens dur ing the acute phase of infection.  Positive  results are indicative of active infection with SARS-CoV-2.  Clinical  correlation with patient history and other diagnostic information is  necessary to determine patient infection status.  Positive results do  not rule out bacterial infection or co-infection with other viruses. If result is PRESUMPTIVE POSTIVE SARS-CoV-2 nucleic acids MAY BE PRESENT.   A presumptive positive result was obtained on the submitted specimen  and confirmed on repeat testing.  While 2019 novel coronavirus  (SARS-CoV-2) nucleic acids may be present in the submitted sample  additional confirmatory testing  may be necessary for epidemiological  and / or clinical management purposes  to differentiate between  SARS-CoV-2 and other Sarbecovirus currently known to infect humans.  If clinically indicated additional testing with an alternate test  methodology [phone]) is advised. The SARS-CoV-2 RNA is generally  detectable in upper and lower respiratory sp ecimens during the acute  phase of infection. The expected result is Negative. Fact Sheet for Patients:  BoilerBrush.com.cy Fact Sheet for Healthcare Providers: https://pope.com/ This test is not yet approved or cleared by the [place] FDA and has been authorized for detection and/or diagnosis of SARS-CoV-2 by FDA under an Emergency Use Authorization (EUA).  This EUA will remain in effect (meaning this test can be used) for the duration of the COVID-19 declaration under Section 564(b)(1) of the Act, 21 U.S.C. section 360bbb-3(b)(1), unless the authorization is terminated or revoked sooner. Performed at [facility], [address] [place], [place] [address]   I-Stat beta hCG blood, ED     Status: None   Collection Time: 01/03/19  8:28 PM  Result Value Ref Range   I-stat hCG, quantitative <5.0 <5 mIU/mL   Comment 3            Comment:   GEST. AGE      CONC.  (mIU/mL)   <=1 WEEK        5 - 50     2 WEEKS       50 - 500     3 WEEKS       100 - 10,000     4 WEEKS     1,000 - 30,000        FEMALE AND NON-PREGNANT FEMALE:     LESS THAN 5 mIU/mL     Blood Alcohol level:  Lab Results  Component Value Date   ETH <10 01/03/2019    Metabolic Disorder Labs: Lab Results  Component Value Date   HGBA1C 6.1 (H) 01/04/2016   MPG 128 01/04/2016   MPG 128 (H) 11/19/2014   Lab Results  Component Value Date   PROLACTIN 7.5 12/09/2012   Lab Results  Component Value Date   CHOL 192 (H) 01/04/2016   TRIG 159 (H) 01/04/2016   HDL 44 01/04/2016   CHOLHDL 4.4 01/04/2016    VLDL 32 (H) 01/04/2016   LDLCALC 116 (H) 01/04/2016   LDLCALC 101 11/19/2014    Physical Findings: AIMS: Facial and Oral Movements Lips and Perioral Area: None, normal Jaw: None, normal Tongue: None, normal,Extremity Movements Upper (arms, wrists, hands, fingers): None, normal Lower (legs, knees, ankles, toes): None, normal, Trunk Movements Neck, shoulders, hips: None, normal, Overall Severity Severity of abnormal movements (highest score from questions above): None, normal Incapacitation due  to abnormal movements: None, normal, Dental Status Current problems with teeth and/or dentures?: No Does patient usually wear dentures?: No  CIWA:  CIWA-Ar Total: 1 COWS:  COWS Total Score: 1  Musculoskeletal: Strength & Muscle Tone: within normal limits Gait & Station: normal Patient leans: N/A  Psychiatric Specialty Exam: Physical Exam  ROS  Blood pressure 123/84, pulse 61, temperature 98.3 F (36.8 C), temperature source Oral, resp. rate 18, height 5\' 7"  (1.702 m), weight 94.3 kg, last menstrual period 12/17/2018.Body mass index is 32.58 kg/m.  General Appearance: Casual  Eye Contact:  Fair  Speech:  Pressured  Volume:  Increased  Mood:  Anxious  Affect:  Congruent  Thought Process:  Descriptions of Associations: Loose  Orientation:  Full (Time, Place, and Person)  Thought Content:  Illogical and Delusions  Suicidal Thoughts:  No  Homicidal Thoughts:  No  Memory:  Immediate;   Poor  Judgement:  Impaired  Insight:  Lacking  Psychomotor Activity:  Increased  Concentration:  Concentration: Fair  Recall:  [employer]  Fund of Knowledge:  Fair  Language:  Fair  Akathisia:  Negative  Handed:  Right  AIMS (if indicated):     Assets:  Physical Health  ADL's:  Intact  Cognition:  WNL  Sleep:  Number of Hours: 6.25     Treatment Plan Summary: Daily contact with patient to assess and evaluate symptoms and progress in treatment, Medication management and Plan Will escalate the  dose of Risperdal continue reality-based therapy discussed with mother continue to monitor for safety  Malvin JohnsFARAH,Zada Haser, MD 01/05/2019, 8:16 AM

## 2019-01-05 NOTE — Plan of Care (Signed)
D: Patient is alert, confused, argumentative, and preoccupied. Denies SI, HI, AVH. Patient is preoccupied with her last name and "the lady harassing her". Patient denies physical symptoms/pain. Patient refuses to take medications.   Patients father called asking about her medications and her condition. He would like to speak to the doctor.    A: Patient refuses to take medications. Support provided. Patient educated on safety on the unit and medications. Routine safety checks every 15 minutes. Patient stated understanding to tell nurse about any new physical symptoms. Patient understands to tell staff of any needs.     R: No adverse drug reactions noted. Patient verbally contracts for safety. Patient remains safe at this time and will continue to monitor.   Problem: Education: Goal: Knowledge of [place] [facility] Education information/materials will improve Outcome: Progressing   Problem: Safety: Goal: Periods of time without injury will increase Outcome: Progressing   Patient oriented to the unit. Patient remains safe and will continue to monitor.   [place] NOVEL CORONAVIRUS (COVID-19) DAILY CHECK-OFF SYMPTOMS - answer yes or no to each - every day NO YES  Have you had a fever in the past 24 hours?  Fever (Temp > 37.80C / 100F) X   Have you had any of these symptoms in the past 24 hours? New Cough  Sore Throat   Shortness of Breath  Difficulty Breathing  Unexplained Body Aches   X   Have you had any one of these symptoms in the past 24 hours not related to allergies?   Runny Nose  Nasal Congestion  Sneezing   X   If you have had runny nose, nasal congestion, sneezing in the past 24 hours, has it worsened?  X   EXPOSURES - check yes or no X   Have you traveled outside the state in the past 14 days?  X   Have you been in contact with someone with a confirmed diagnosis of COVID-19 or PUI in the past 14 days without wearing appropriate PPE?  X   Have you been living in the  same home as a person with confirmed diagnosis of COVID-19 or a PUI (household contact)?    X   Have you been diagnosed with COVID-19?    X              What to do next: Answered NO to all: Answered YES to anything:   Proceed with unit schedule Follow the [facility] Inpatient Flowsheet.

## 2019-01-05 NOTE — Progress Notes (Signed)
Recreation Therapy Notes  Date: 01/05/2019 Time: 10:00 am Location: 500 hall   Group Topic: Introduction to Anxiety.  Goal Area(s) Addresses:  Patient will work on worksheet on Introduction to Anxiety Patient will follow directions on first prompt.  Behavioral Response: Appropriate  Intervention: Worksheet  Activity:  Staff on 500 hall were provided with a worksheet on Introduction to Anxiety. Staff was instructed to give it to the patients and have them work on it in place of [place]. Staff was also given 2 coloring sheets and 1 word search and were given the option to give them out.  Education:  Ability to follow Directions, Change of thought processes Discharge Planning.   Education Outcome: Acknowledges education/In group clarification offered  Clinical Observations/Feedback: . Due to COVID-19, guidelines group was not held. Group members were provided a learning activity worksheet to work on the topic and above-stated goals. LRT is available to answer any questions patient may have regarding the worksheet.  Tomi Likens, LRT/CTRS         Lafonda Patron L Deitrich Steve 01/05/2019 12:07 PM

## 2019-01-05 NOTE — CIRT (Signed)
[facility] called. This patient agitating peer on the unit resulting in unsafe milieu. Patient refusing to comply with redirection on the unit to return to her room while staff tending to peer stating to writer '' you're not the doctor, did he order me to go back to my room ? I'm going to stand right here! '' patient escalating milieu and threatening staff. She continues to perseverate that she is '' not Briana Lynch and you are all poisoning me. '' pt repeatedly asks multiple staff same questions, and has difficulty processing information given, referring that staff are abusing her despite education given. Patient refusing medication offered. Pt becoming unsafe on the unit as she is inserting herself into personal space of agitated patient. Orders received. Patient refuses to cooperate with medications stating '' I will fight you, if you want to abuse me that's the only way I am going to take medications. '' manual hold (upper arm shoulder hold ) at 1104 - 1106 to safely administer medications. Please refer to [facility]. Pt received without injury, tolerated well. MD aware of above. Pt refused all vital signs repeatedly. Pt remains safe in the care of primary RN.

## 2019-01-05 NOTE — Progress Notes (Signed)
   01/05/19 1920  COVID-19 Daily Checkoff  Have you had a fever (temp > 37.80C/100F)  in the past 24 hours?  No  COVID-19 EXPOSURE  Have you traveled outside the state in the past 14 days? No  Have you been in contact with someone with a confirmed diagnosis of COVID-19 or PUI in the past 14 days without wearing appropriate PPE? No  Have you been living in the same home as a person with confirmed diagnosis of COVID-19 or a PUI (household contact)? No  Have you been diagnosed with COVID-19? No

## 2019-01-05 NOTE — Plan of Care (Signed)
Patient refuses to be redirected she is continually inserting herself in the manic episode of another patient getting everyone more agitated.  He is argumentative with staff, agitating the patient is already manic and has already required IM medications-we will have to give her forced meds but she is refusing meds, stirring up a manic patient towards further agitation

## 2019-01-05 NOTE — [facility] Counselor (Signed)
CSW attempted to complete PSA but pt is unable to provide accurate information at this time. Winferd Humphrey, MSW, LCSW Clinical Social Worker 01/05/2019 11:35 AM

## 2019-01-05 NOTE — Tx Team (Signed)
Interdisciplinary Treatment and Diagnostic Plan Update  01/05/2019 Time of Session: 09:34am Briana Lynch MRN: [id number]  Principal Diagnosis: <principal problem not specified>  Secondary Diagnoses: Active Problems:   Psychosis ([place])   Current Medications:  Current Facility-Administered Medications  Medication Dose Route Frequency Provider Last Rate Last Dose  . acetaminophen (TYLENOL) tablet 650 mg  650 mg Oral Q6H PRN Sharma Covert, MD      . alum & mag hydroxide-simeth (MAALOX/MYLANTA) 200-200-20 MG/5ML suspension 30 mL  30 mL Oral Q4H PRN Sharma Covert, MD      . benztropine (COGENTIN) tablet 0.5 mg  0.5 mg Oral BID Johnn Hai, MD      . clonazePAM Bobbye [place]) tablet 0.5 mg  0.5 mg Oral BID Johnn Hai, MD      . haloperidol (HALDOL) tablet 5 mg  5 mg Oral Q6H PRN Johnn Hai, MD       Or  . haloperidol lactate (HALDOL) injection 10 mg  10 mg Intramuscular Q6H PRN Johnn Hai, MD      . hydrOXYzine (ATARAX/VISTARIL) tablet 25 mg  25 mg Oral TID PRN Sharma Covert, MD      . LORazepam (ATIVAN) 2 MG/ML injection           . LORazepam (ATIVAN) tablet 2 mg  2 mg Oral Q4H PRN Johnn Hai, MD       Or  . LORazepam (ATIVAN) injection 2 mg  2 mg Intramuscular Q4H PRN Johnn Hai, MD      . magnesium hydroxide (MILK OF MAGNESIA) suspension 30 mL  30 mL Oral Daily PRN Sharma Covert, MD      . nicotine polacrilex (NICORETTE) gum 2 mg  2 mg Oral PRN Sharma Covert, MD      . omega-3 acid ethyl esters (LOVAZA) capsule 1 g  1 g Oral BID Sharma Covert, MD      . omega-3 acid ethyl esters (LOVAZA) capsule 1 g  1 g Oral BID Johnn Hai, MD      . Derrill Memo ON 01/06/2019] risperiDONE (RISPERDAL) tablet 2 mg  2 mg Oral Daily Johnn Hai, MD      . risperiDONE (RISPERDAL) tablet 4 mg  4 mg Oral QHS Johnn Hai, MD      . temazepam (RESTORIL) capsule 30 mg  30 mg Oral QHS Johnn Hai, MD      . traZODone (DESYREL) tablet 50 mg  50 mg Oral QHS PRN Sharma Covert, MD      . ziprasidone (GEODON) 20 MG injection            PTA Medications: Medications Prior to Admission  Medication Sig Dispense Refill Last Dose  . metFORMIN (GLUCOPHAGE-XR) 500 MG 24 hr tablet TAKE 3 TABLETS EVERY DAY (Patient not taking: Reported on 01/03/2019) 90 tablet 3 Unknown at Unknown time    Patient Stressors: Traumatic event  Patient Strengths: Active sense of humor Average or above average intelligence Supportive family/friends  Treatment Modalities: Medication Management, Group therapy, Case management,  1 to 1 session with clinician, Psychoeducation, Recreational therapy.   Physician Treatment Plan for Primary Diagnosis: <principal problem not specified> Long Term Goal(s): Improvement in symptoms so as ready for discharge Improvement in symptoms so as ready for discharge   Short Term Goals: Ability to identify changes in lifestyle to reduce recurrence of condition will improve Ability to verbalize feelings will improve Ability to disclose and discuss suicidal ideas Ability to demonstrate self-control will improve Ability to  identify and develop effective coping behaviors will improve Ability to maintain clinical measurements within normal limits will improve Ability to identify triggers associated with substance abuse/mental health issues will improve Ability to identify changes in lifestyle to reduce recurrence of condition will improve Ability to verbalize feelings will improve Ability to disclose and discuss suicidal ideas Ability to demonstrate self-control will improve Ability to identify and develop effective coping behaviors will improve Ability to maintain clinical measurements within normal limits will improve Ability to identify triggers associated with substance abuse/mental health issues will improve  Medication Management: Evaluate patient's response, side effects, and tolerance of medication regimen.  Therapeutic Interventions: 1 to 1  sessions, Unit Group sessions and Medication administration.  Evaluation of Outcomes: Not Progressing  Physician Treatment Plan for Secondary Diagnosis: Active Problems:   Psychosis ([facility])  Long Term Goal(s): Improvement in symptoms so as ready for discharge Improvement in symptoms so as ready for discharge   Short Term Goals: Ability to identify changes in lifestyle to reduce recurrence of condition will improve Ability to verbalize feelings will improve Ability to disclose and discuss suicidal ideas Ability to demonstrate self-control will improve Ability to identify and develop effective coping behaviors will improve Ability to maintain clinical measurements within normal limits will improve Ability to identify triggers associated with substance abuse/mental health issues will improve Ability to identify changes in lifestyle to reduce recurrence of condition will improve Ability to verbalize feelings will improve Ability to disclose and discuss suicidal ideas Ability to demonstrate self-control will improve Ability to identify and develop effective coping behaviors will improve Ability to maintain clinical measurements within normal limits will improve Ability to identify triggers associated with substance abuse/mental health issues will improve     Medication Management: Evaluate patient's response, side effects, and tolerance of medication regimen.  Therapeutic Interventions: 1 to 1 sessions, Unit Group sessions and Medication administration.  Evaluation of Outcomes: Not Progressing   RN Treatment Plan for Primary Diagnosis: <principal problem not specified> Long Term Goal(s): Knowledge of disease and therapeutic regimen to maintain health will improve  Short Term Goals: Ability to participate in decision making will improve, Ability to verbalize feelings will improve, Ability to disclose and discuss suicidal ideas, Ability to identify and develop effective coping behaviors  will improve and Compliance with prescribed medications will improve  Medication Management: RN will administer medications as ordered by provider, will assess and evaluate patient's response and provide education to patient for prescribed medication. RN will report any adverse and/or side effects to prescribing provider.  Therapeutic Interventions: 1 on 1 counseling sessions, Psychoeducation, Medication administration, Evaluate responses to treatment, Monitor vital signs and CBGs as ordered, Perform/monitor CIWA, COWS, AIMS and Fall Risk screenings as ordered, Perform wound care treatments as ordered.  Evaluation of Outcomes: Not Progressing   LCSW Treatment Plan for Primary Diagnosis: <principal problem not specified> Long Term Goal(s): Safe transition to appropriate next level of care at discharge, Engage patient in therapeutic group addressing interpersonal concerns.  Short Term Goals: Engage patient in aftercare planning with referrals and resources and Increase skills for wellness and recovery  Therapeutic Interventions: Assess for all discharge needs, 1 to 1 time with Social worker, Explore available resources and support systems, Assess for adequacy in community support network, Educate family and significant other(s) on suicide prevention, Complete Psychosocial Assessment, Interpersonal group therapy.  Evaluation of Outcomes: Not Progressing   Progress in Treatment: Attending groups: Yes. Participating in groups: Yes. Taking medication as prescribed: No. Toleration medication: No. Family/Significant other  contact made: Yes, individual(s) contacted:  pt's mother Patient understands diagnosis: No. Discussing patient identified problems/goals with staff: Yes. Medical problems stabilized or resolved: Yes. Denies suicidal/homicidal ideation: Yes. Issues/concerns per patient self-inventory: No. Other:   New problem(s) identified: No, Describe:  None  New Short Term/Long Term  Goal(s): Medication stabilization, elimination of SI thoughts, and development of a comprehensive mental wellness plan.   Patient Goals:  "To go home. I was forced to be here"  Discharge Plan or Barriers: CSW will continue to follow up for appropriate referrals and possible discharge planning  Reason for Continuation of Hospitalization: Aggression Delusions  Hallucinations Medication stabilization  Estimated Length of Stay: 3-5 days  Attendees: Patient: 01/05/2019   Physician: Dr. Malvin JohnsBrian Farah, MD  01/05/2019  Nursing: Arlyss RepressAlyssa, RN 01/05/2019   RN Care Manager: 01/05/2019  Social Worker: Stephannie PetersJasmine Chidiebere Wynn, LCSW 01/05/2019   Recreational Therapist:  01/05/2019   Other:  01/05/2019   Other:  01/05/2019  Other: 01/05/2019      Scribe for Treatment Team: Delphia GratesJasmine M Kemesha Mosey, LCSW 01/05/2019 11:56 AM
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
## 2019-01-06 MED ORDER — PRENATAL MULTIVITAMIN CH
1.0000 | ORAL_TABLET | Freq: Every day | ORAL | Status: DC
  Administered 2019-01-06 – 2019-01-09 (×4): 1 via ORAL
  Filled 2019-01-06 (×5): qty 1

## 2019-01-06 MED ORDER — PRENATAL MULTIVITAMIN CH
1.0000 | ORAL_TABLET | Freq: Every day | ORAL | Status: DC
  Filled 2019-01-06 (×2): qty 1

## 2019-01-06 NOTE — Progress Notes (Signed)
D:  Briana Lynch was up and visible on the unit at the beginning of the shift.  She was up at the nurses station repeatedly asking of various things and difficult to redirect at times.  She denied SI/HI or A/V hallucinations.  She continued to report that she is Briana Lynch, not Briana Lynch and refused her hs medications.  She did go to her room around 730pm and promptly fell asleep.  She had woken up once per night but was able to fall back asleep.  She is currently resting with her eyes closed and appears to be asleep. A:  1:1 with RN for support and encouragement.  Medications as ordered.  Q 15 minute checks maintained for safety.  Encouraged participation in group and unit activities.   R:  Briana Lynch remains safe on the unit.  We will continue to monitor the progress towards her goals.

## 2019-01-06 NOTE — Progress Notes (Signed)
Recreation Therapy Notes  Date: 01/06/2019 Time: 10:00 am Location: [address] hall   Group Topic: Goal Planning.  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 Patient will follow directions on first prompt.  Behavioral Response: Appropriate  Intervention: Worksheet  Activity:  Staff on 500 hall were provided with a worksheet on Goal Planning. Staff was instructed to give it to the patients and have them work on it in place of [place]. Staff was also given 2 coloring sheets and 1 word search and were given the option to give them out.  Education:  Ability to follow Directions, Change of thought processes Discharge Planning, Goal Planning.   Education Outcome: Acknowledges education/In group clarification offered  Clinical Observations/Feedback: . Due to COVID-19, guidelines group was not held. Group members were provided a learning activity worksheet to work on the topic and above-stated goals. LRT is available to answer any questions patient may have regarding the worksheet.  Tomi Likens, LRT/CTRS         Briana Lynch 01/06/2019 11:17 AM

## 2019-01-06 NOTE — Progress Notes (Signed)
Patient ID: Briana Lynch, female   DOB: 10/02/1995, 23 y.o.   MRN: [id number]   CSW contacted pt's mother after she requested a phone call from a social worker. Pt's mother asked about how the pt's doing with her medication regimen. Pt's mother reports that a nurse called her and stated that she has started to take her medications today, but had been refusing them in the past. Pt's mother reports that the pt just graduated from [employer] and got accepted to [employer] at [employer] (to start in August). Pt's mother stated that everything started in March when she had to come home from school due to [place] and finish online. Pt's mother stated that the pt does not do well online and it started to affect her. She reports that the pt had a science class called "Log[employer]" and that she struggled due to her have a perfectionist personality. Pt's mother stated that she has been up and down for the past 2 months and even started to throw her things away. Patient threw her phone, apple watch and other belongings in the trash. 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 Pt's mother stated that the patient has been upset about her legal last name for a while. Pt's mother's maiden name is Briana Lynch. She reports that she gave birth to the pt when she was 80 and that she was not married to the pt's father yet so she gave the pt her maiden last name. Since, the pt's mother and pt's father have gotten married and had two more children that all have the last name "Briana Lynch". Pt's mother states that even the pt's last name on Facebook is "Briana Lynch" and she has no idea why she is so fixated on the last name because she knows her government legal last name. Pt's mother stated that the family is very close knit. The pt and  her have always been close and gotten along. The pt has two younger sisters that are very close. Pt's mother reports that she has always worked from home so she has been around the pt and her sisters, so she reports being upset that the pt is not keen with her right now. Pt's mother states that recently the pt has felt that her sister has started a Twitter page to talk about her. She has become paranoid that her sister is talking about her over social media. Pt's mother denies that this is actually happening. Pt's mother stated that about 2 weeks ago, the pt went to go live with her grandmother (dad's mother). Pt's grandmother also has her other son living with her (who has schizophrenia). Pt's grandmother also has bipolar. Pt's mother reports that she got worse while living with them and that when she requested that the pt go to the hospital that the grandmother got upset and started telling the pt about how inpatient is and blaming the mother. Pt's mother states that she feels that the pt is upset with her about bringing her to the hospital. Pt's mother reports that she called the pt and stated, "hey it's your mother" and the pt hung up. Pt's mother stated that her little sister called and talked to her (they had it on speaker for everyone to hear) and the  pt stated, "well if mama thinks I need to be here then I guess I do" and then a few minutes later she said, "nevermind she is not my mother". Pt's mother reports that she struggled to know who the sister was, but the only family member that she does recognize is her father.  Pt's mother asked for an update tomorrow.

## 2019-01-06 NOTE — Progress Notes (Signed)
Patient visible on unit at times but also spends time in her room along.  Patient has been calm, cooperative, and pleasant throughout the shift.  q 15 min checks in place for patient safety.
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
## 2019-01-06 NOTE — Progress Notes (Signed)
The focus of this group is to help patients establish daily goals to achieve during treatment and discuss how the patient can incorporate goal setting into their daily lives to aide in recovery. 

## 2019-01-06 NOTE — BHH Group Notes (Signed)
[facility] LCSW Group Therapy Note  Date/Time: 01/06/2019 @ 11am  Type of Therapy and Topic:  Group Therapy:  Overcoming Obstacles  Participation Level:  Active  Description of Group:    In this group patients will be encouraged to explore what they see as obstacles to their own wellness and recovery. They will be guided to discuss their thoughts, feelings, and behaviors related to these obstacles. The group will process together ways to cope with barriers, with attention given to specific choices patients can make. Each patient will be challenged to identify changes they are motivated to make in order to overcome their obstacles. This group will be process-oriented, with patients participating in exploration of their own experiences as well as giving and receiving support and challenge from other group members.  Therapeutic Goals: 1. Patient will identify personal and current obstacles as they relate to admission. 2. Patient will identify barriers that currently interfere with their wellness or overcoming obstacles.  3. Patient will identify feelings, thought process and behaviors related to these barriers. 4. Patient will identify two changes they are willing to make to overcome these obstacles:    Summary of Patient Progress  Pt was active and engaged throughout group. Pt was able too identify an obstacle that she is trying to overcome that is: to not allow others to control her so that she doesn't end up hurting their feelings. Pt feels that her barriers towards overcoming this obstacle is that she is not speaking her mind when she should. Pt was able to identify two changes that she is will to make to overcome these obstacles which is allowing herself more space. Pt was attentive to other group members sharing and offered verbal encouragement to other group members.     Therapeutic Modalities:   Cognitive Behavioral Therapy Solution Focused Therapy Motivational Interviewing Relapse  Prevention Therapy   Ardelle Anton, LCSW

## 2019-01-07 NOTE — Progress Notes (Signed)
   01/06/19 2214  COVID-19 Daily Checkoff  Have you had a fever (temp > 37.80C/100F)  in the past 24 hours?  No  COVID-19 EXPOSURE  Have you traveled outside the state in the past 14 days? No  Have you been in contact with someone with a confirmed diagnosis of COVID-19 or PUI in the past 14 days without wearing appropriate PPE? No  Have you been living in the same home as a person with confirmed diagnosis of COVID-19 or a PUI (household contact)? No  Have you been diagnosed with COVID-19? No

## 2019-01-07 NOTE — Progress Notes (Signed)
Nursing Progress Note: 7p-7a D: Pt currently presents with a paranoid/pleasant/anxious/superficial affect and behavior. Interacting apropriateyl with the milieu. Pt reports good sleep during the previous night with current medication regimen.  A: Pt provided with medications per providers orders. Pt's labs and vitals were monitored throughout the night. Pt supported emotionally and encouraged to express concerns and questions. Pt educated on medications.  R: Pt's safety ensured with 15 minute and environmental checks. Pt currently denies SI, HI, and AVH. Pt verbally contracts to seek staff if SI,HI, or AVH occurs and to consult with staff before acting on any harmful thoughts. Will continue to monitor.   [place] NOVEL CORONAVIRUS (COVID-19) DAILY CHECK-OFF SYMPTOMS - answer yes or no to each - every day NO YES  Have you had a fever in the past 24 hours?  . Fever (Temp > 37.80C / 100F) X   Have you had any of these symptoms in the past 24 hours? . New Cough .  Sore Throat  .  Shortness of Breath .  Difficulty Breathing .  Unexplained Body Aches   X   Have you had any one of these symptoms in the past 24 hours not related to allergies?   . Runny Nose .  Nasal Congestion .  Sneezing   X   If you have had runny nose, nasal congestion, sneezing in the past 24 hours, has it worsened?  X   EXPOSURES - check yes or no X   Have you traveled outside the state in the past 14 days?  X   Have you been in contact with someone with a confirmed diagnosis of COVID-19 or PUI in the past 14 days without wearing appropriate PPE?  X   Have you been living in the same home as a person with confirmed diagnosis of COVID-19 or a PUI (household contact)?    X   Have you been diagnosed with COVID-19?    X              What to do next: Answered NO to all: Answered YES to anything:   Proceed with unit schedule Follow the [facility] Inpatient Flowsheet.

## 2019-01-07 NOTE — BHH Group Notes (Cosign Needed)
Occupational Therapy Group Note  Date:  01/07/2019 Time:  9:03 PM  Group Topic/Focus:  Leisure Group  Participation Level:  Active  Participation Quality:  Appropriate  Affect:  Animated  Cognitive:  Alert  Insight: Improving  Engagement in Group:  Engaged  Modes of Intervention:  Activity, Discussion, Education and Socialization  Additional Comments:    S: none stated this date  O: OT group focus on leisure this date, while incorporating coping skills to ensure understanding. Pts to play game of Uno: and name a struggle they're facing, a communication skill, something they like about yourself, stress management, and a healthy way to manage anger per certain cards played. Pt also assessed for attention, ability to follow rules, and temperament with rules.   A: Pt presents with animated affect, engaged and participatory throughout group. Pt did not draw a card warranting a subjective response, but was very engaged and supportive of others responses and thoughts. She is also very supportive of other group members. She needed min VC's to maintain attention, but overall displayed appropriate temperament with rules.  P: OT group will be x1 per week while pt inpatient.   Zenovia Jarred, MSOT, OTR/L Behavioral Health OT/ Acute Relief OT PHP Office: [place] 01/07/2019, 9:03 PM

## 2019-01-07 NOTE — Progress Notes (Signed)
D:  Patient's self inventory sheet, patient sleeps good, no sleep medication.  Good appetite, normal energy level, good concentration.  Denied depression, hopeless and anxiety.  Denied withdrawals.  Denied SI.  Denied physical problems.  Denied physical pain.  Goal is being thankful.  Plans to be pleasant to everyone.  No discharge plans. A:  Medications administered per MD orders.  Emotional support and encouragement given patient. R:  Denied SI and HI, contracts for safety.  Denied A/V hallucinations.  Safety maintained with 15 minute checks.

## 2019-01-07 NOTE — Progress Notes (Signed)
Recreation Therapy Notes  Date: 01/07/2019 Time: 10:00 am Location: [address] hall   Group Topic: Anger Triggers and Management  Goal Area(s) Addresses:  Patient will work on worksheet on Anger Triggers and Management. Patient will follow directions on first prompt.  Behavioral Response: Appropriate  Intervention: Worksheet  Activity:  Staff on [address] were provided with a worksheet on Anger Triggers and Management. Staff was instructed to give it to the patients and have them work on it in place of Recreation Therapy Group. Staff was also given 2 coloring sheets and 2 word search and were given the option to give them out.  Education:  Ability to follow Directions, Change of thought processes Discharge Planning, Goal Planning.   Education Outcome: Acknowledges education/In group clarification offered  Clinical Observations/Feedback: . Due to COVID-19, guidelines group was not held. Group members were provided a learning activity worksheet to work on the topic and above-stated goals. LRT is available to answer any questions patient may have regarding the worksheet.  Briana Lynch L Briana Lynch, LRT/CTRS        Briana Lynch L Briana Lynch 01/07/2019 1:10 PM 

## 2019-01-07 NOTE — Progress Notes (Signed)
Patient ID: Briana Lynch, female   DOB: 06-14-1996, 23 y.o.   MRN: [id number]   Pt's mother contacted CSW. Pt's mother asked how the pt is doing. She feels that she sounded good yesterday, but not so well today. Pt's mother stated that her husband (pt's father) told her not to call the pt because it upsets her. Pt's mother asked if the pt attended group and how she is doing. Pt's mother asked if it was a good idea to talk to the pt. CSW let her know that is up to her. Pt's mother asked to be transferred to talk to the pt.

## 2019-01-07 NOTE — Progress Notes (Signed)
D:  Briana Lynch has been in her room much of the evening.  She was noted sleeping early but woke up for hs medications.  She denied SI/HI or A/V hallucinations.  She reported her day was good and that she is hoping to leave soon.  She took her hs medications without difficulty.  She is currently resting with her eyes closed and appears to be asleep. A:  1:1 with RN for support and encouragement.  Medications as ordered.  Q 15 minute checks maintained for safety.  Encouraged participation in group and unit activities.   R:  Briana Lynch remains safe on the unit.  We will continue to monitor the progress towards her goals.

## 2019-01-07 NOTE — Plan of Care (Signed)
  Problem: Education: Goal: Emotional status will improve Outcome: Progressing   Problem: Activity: Goal: Interest or engagement in activities will improve Outcome: Progressing   

## 2019-01-07 NOTE — Plan of Care (Signed)
Nurse discussed coping skills, anxiety, depression with patient.  

## 2019-01-07 NOTE — Progress Notes (Signed)
[facility]  01/07/2019 10:25 AM Briana Lynch  MRN:  [id number] Subjective:   Patient seen is calm cooperative and compliant with meds states she slept well, she is stating she is no longer "focused" on the issue of her name so the previously expressed delusional intensity is decreased delusional focus is decreased but again according to her mother patient is not baseline and mother believes she is intelligent enough to give us correct answers rather than demonstrate to her recovery.  Patient also wanted to stay with her grandparents however patient's mother would like her to return there at home so discharge planning is still up in the air.  But patient is compliant.  Focused on discharge.  No EPS or TD Principal Problem: Psychotic break/inability to recognize mother due to psychosis/history of trauma in the context of some cannabis dependency Diagnosis: Active Problems:   Psychosis (HCC)  Total Time spent with patient: 20 minutes  Past Medical History:  Past Medical History:  Diagnosis Date  . Ankle sprain and strain 11/15/2010  . Anxiety state 12/09/2012  . Medical history non-contributory   . PCOS (polycystic ovarian syndrome)   . Right ankle sprain 11/15/2010   History reviewed. No pertinent surgical history. Family History:  Family History  Problem Relation Age of Onset  . Diabetes Mother   . Hypertension Mother   . Mental illness Sister   Social History:  Social History   Substance and Sexual Activity  Alcohol Use Not Currently  . Alcohol/week: 0.0 standard drinks     Social History   Substance and Sexual Activity  Drug Use Yes  . Types: Marijuana   Comment: infrequent    Social History   Socioeconomic History  . Marital status: Single    Spouse name: Not on file  . Number of children: Not on file  . Years of education: Not on file  . Highest education level: 12th grade  Occupational History  . Not on file  Social Needs  . Financial resource strain:  Not on file  . Food insecurity    Worry: Patient refused    Inability: Patient refused  . Transportation needs    Medical: Patient refused    Non-medical: Patient refused  Tobacco Use  . Smoking status: Current Some Day Smoker    Packs/day: 1.00    Types: Cigarettes  . Smokeless tobacco: Never Used  . Tobacco comment: Dad smokes  Substance and Sexual Activity  . Alcohol use: Not Currently    Alcohol/week: 0.0 standard drinks  . Drug use: Yes    Types: Marijuana    Comment: infrequent  . Sexual activity: Not Currently    Birth control/protection: Injection  Lifestyle  . Physical activity    Days per week: Patient refused    Minutes per session: Patient refused  . Stress: Not on file  Relationships  . Social Musicianconnections    Talks on phone: Patient refused    Gets together: Patient refused    Attends religious service: Patient refused    Active member of club or organization: Patient refused    Attends meetings of clubs or organizations: Patient refused    Relationship status: Patient refused  Other Topics Concern  . Not on file  Social History Narrative   2014:Sophomore in high school, taking AP classes, enjoys school manages basketball, football and track teams   Additional Social History:  Sleep: Fair  Appetite:  Fair  Current Medications: Current Facility-Administered Medications  Medication Dose Route Frequency Provider Last Rate Last Dose  . acetaminophen (TYLENOL) tablet 650 mg  650 mg Oral Q6H PRN Sharma Covert, MD      . alum & mag hydroxide-simeth (MAALOX/MYLANTA) 200-200-20 MG/5ML suspension 30 mL  30 mL Oral Q4H PRN Sharma Covert, MD      . benztropine (COGENTIN) tablet 0.5 mg  0.5 mg Oral BID Johnn Hai, MD   0.5 mg at 01/07/19 0719  . hydrOXYzine (ATARAX/VISTARIL) tablet 25 mg  25 mg Oral TID PRN Sharma Covert, MD      . LORazepam (ATIVAN) tablet 2 mg  2 mg Oral Q4H PRN Johnn Hai, MD       Or  .  LORazepam (ATIVAN) injection 2 mg  2 mg Intramuscular Q4H PRN Johnn Hai, MD      . magnesium hydroxide (MILK OF MAGNESIA) suspension 30 mL  30 mL Oral Daily PRN Sharma Covert, MD      . nicotine polacrilex (NICORETTE) gum 2 mg  2 mg Oral PRN Sharma Covert, MD      . omega-3 acid ethyl esters (LOVAZA) capsule 1 g  1 g Oral BID Sharma Covert, MD   1 g at 01/07/19 0719  . prenatal multivitamin tablet 1 tablet  1 tablet Oral Daily Johnn Hai, MD   1 tablet at 01/07/19 0719  . risperiDONE (RISPERDAL) tablet 2 mg  2 mg Oral Daily Johnn Hai, MD   2 mg at 01/07/19 0719  . risperiDONE (RISPERDAL) tablet 4 mg  4 mg Oral QHS Johnn Hai, MD   4 mg at 01/06/19 2214  . temazepam (RESTORIL) capsule 30 mg  30 mg Oral QHS Johnn Hai, MD   30 mg at 01/06/19 2214  . traZODone (DESYREL) tablet 50 mg  50 mg Oral QHS PRN Sharma Covert, MD        Lab Results: No results found for this or any previous visit (from the past 48 hour(s)).  Blood Alcohol level:  Lab Results  Component Value Date   ETH <10 54/62/7035    Metabolic Disorder Labs: Lab Results  Component Value Date   HGBA1C 6.1 (H) 01/04/2016   MPG 128 01/04/2016   MPG 128 (H) 11/19/2014   Lab Results  Component Value Date   PROLACTIN 7.5 12/09/2012   Lab Results  Component Value Date   CHOL 192 (H) 01/04/2016   TRIG 159 (H) 01/04/2016   HDL 44 01/04/2016   CHOLHDL 4.4 01/04/2016   VLDL 32 (H) 01/04/2016   LDLCALC 116 (H) 01/04/2016   LDLCALC 101 11/19/2014    Physical Findings: AIMS: Facial and Oral Movements Lips and Perioral Area: None, normal Jaw: None, normal Tongue: None, normal,Extremity Movements Upper (arms, wrists, hands, fingers): None, normal Lower (legs, knees, ankles, toes): None, normal, Trunk Movements Neck, shoulders, hips: None, normal, Overall Severity Severity of abnormal movements (highest score from questions above): None, normal Incapacitation due to abnormal movements: None,  normal, Dental Status Current problems with teeth and/or dentures?: No Does patient usually wear dentures?: No  CIWA:  CIWA-Ar Total: 1 COWS:  COWS Total Score: 1  Musculoskeletal: Strength & Muscle Tone: within normal limits Gait & Station: normal Patient leans: N/A  Psychiatric Specialty Exam: Physical Exam  ROS  Blood pressure 114/78, pulse 89, temperature 97.9 F (36.6 C), temperature source Oral, resp. rate 16, height 5\' 7"  (1.702 m), weight 94.3  kg, last menstrual period 12/17/2018.Body mass index is 32.58 kg/m.  General Appearance: Casual  Eye Contact:  Good  Speech:  Clear and Coherent  Volume:  Normal  Mood:  Euthymic  Affect:  Congruent  Thought Process:  Linear and Descriptions of Associations: Circumstantial  Orientation:  Full (Time, Place, and Person)  Thought Content:  Delusions  Suicidal Thoughts:  No  Homicidal Thoughts:  No  Memory:  Immediate;   Fair  Judgement:  Fair  Insight:  Fair  Psychomotor Activity:  Normal  Concentration:  Concentration: Fair  Recall:  [employer]Fair  Fund of Knowledge:  Fair  Language:  Fair  Akathisia:  Negative  Handed:  Right  AIMS (if indicated):     Assets:  Physical Health Resilience  ADL's:  Intact  Cognition:  WNL  Sleep:  Number of Hours: 6.75     Treatment Plan Summary: Daily contact with patient to assess and evaluate symptoms and progress in treatment, Medication management and Plan Continue reality-based and antipsychotic therapy continue cognitive therapy will discuss variable discharge planning with his caseworker and patient  Malvin JohnsFARAH,Camdan Burdi, MD 01/07/2019, 10:25 AM

## 2019-01-08 MED ORDER — RISPERIDONE MICROSPHERES ER 50 MG IM SRER
50.0000 mg | INTRAMUSCULAR | Status: DC
  Administered 2019-01-08: 50 mg via INTRAMUSCULAR
  Filled 2019-01-08: qty 2

## 2019-01-08 NOTE — Progress Notes (Signed)
Recreation Therapy Notes  Date: 01/08/2019 Time: 10:00 am Location: 500 hall   Group Topic: Triggers  Goal Area(s) Addresses:  Patient will work on worksheet on Triggers. Patient will follow directions on first prompt.  Behavioral Response: Appropriate  Intervention: Worksheet  Activity:  Staff on 500 hall were provided with a worksheet on Triggers. Staff was instructed to give it to the patients and have them work on it in place of [place]. Staff was also given 2 coloring sheets and 2 word search and were given the option to give them out.  Education:  Ability to follow Directions, Change of thought processes Discharge Planning, Goal Planning.   Education Outcome: Acknowledges education/In group clarification offered  Clinical Observations/Feedback: . Due to COVID-19, guidelines group was not held. Group members were provided a learning activity worksheet to work on the topic and above-stated goals. LRT is available to answer any questions patient may have regarding the worksheet.  Tomi Likens, LRT/CTRS         Glorianna Gott L Denzal Meir 01/08/2019 1:05 PM

## 2019-01-08 NOTE — Progress Notes (Signed)
[facility] MD Progress Note  01/08/2019 1:19 PM ROBBI SPELLS  MRN:  [id number] Subjective:     Patient seen she is compliant with meds is very focused on discharge she did indeed call her mother to pick her up and I met with the mother in the lobby with the patient's permission the patient did not in fact been discharged yet she is been instructed after team we would see how the day was progressing but she interpreted this as discharge imminent at any rate she is compliant here because she understands that will lead to a speedy recovery and discharge however family fears she will not take meds at home therefore we will administer long-acting injectable today   Principal Problem: New onset psychosis  Diagnosis: Active Problems:   Psychosis ([place])  Total Time spent with patient: 20 minutes  Past Medical History:  Past Medical History:  Diagnosis Date  . Ankle sprain and strain 11/15/2010  . Anxiety state 12/09/2012  . Medical history non-contributory   . PCOS (polycystic ovarian syndrome)   . Right ankle sprain 11/15/2010   History reviewed. No pertinent surgical history. Family History:  Family History  Problem Relation Age of Onset  . Diabetes Mother   . Hypertension Mother   . Mental illness Sister     Social History:  Social History   Substance and Sexual Activity  Alcohol Use Not Currently  . Alcohol/week: 0.0 standard drinks     Social History   Substance and Sexual Activity  Drug Use Yes  . Types: Marijuana   Comment: infrequent    Social History   Socioeconomic History  . Marital status: Single    Spouse name: Not on file  . Number of children: Not on file  . Years of education: Not on file  . Highest education level: 12th grade  Occupational History  . Not on file  Social Needs  . Financial resource strain: Not on file  . Food insecurity    Worry: Patient refused    Inability: Patient refused  . Transportation needs    Medical: Patient refused   Non-medical: Patient refused  Tobacco Use  . Smoking status: Current Some Day Smoker    Packs/day: 1.00    Types: Cigarettes  . Smokeless tobacco: Never Used  . Tobacco comment: Dad smokes  Substance and Sexual Activity  . Alcohol use: Not Currently    Alcohol/week: 0.0 standard drinks  . Drug use: Yes    Types: Marijuana    Comment: infrequent  . Sexual activity: Not Currently    Birth control/protection: Injection  Lifestyle  . Physical activity    Days per week: Patient refused    Minutes per session: Patient refused  . Stress: Not on file  Relationships  . Social Herbalist on phone: Patient refused    Gets together: Patient refused    Attends religious service: Patient refused    Active member of club or organization: Patient refused    Attends meetings of clubs or organizations: Patient refused    Relationship status: Patient refused  Other Topics Concern  . Not on file  Social History Narrative   2014:Sophomore in high school, taking AP classes, enjoys school manages basketball, football and track teams   Additional Social History:                         Sleep: Good  Appetite:  Good  Current Medications: Current Facility-Administered Medications  Medication Dose Route Frequency Provider Last Rate Last Dose  . acetaminophen (TYLENOL) tablet 650 mg  650 mg Oral Q6H PRN Sharma Covert, MD      . alum & mag hydroxide-simeth (MAALOX/MYLANTA) 200-200-20 MG/5ML suspension 30 mL  30 mL Oral Q4H PRN Sharma Covert, MD      . benztropine (COGENTIN) tablet 0.5 mg  0.5 mg Oral BID Johnn Hai, MD   0.5 mg at 01/08/19 0745  . hydrOXYzine (ATARAX/VISTARIL) tablet 25 mg  25 mg Oral TID PRN Sharma Covert, MD      . LORazepam (ATIVAN) tablet 2 mg  2 mg Oral Q4H PRN Johnn Hai, MD       Or  . LORazepam (ATIVAN) injection 2 mg  2 mg Intramuscular Q4H PRN Johnn Hai, MD      . magnesium hydroxide (MILK OF MAGNESIA) suspension 30 mL  30 mL  Oral Daily PRN Sharma Covert, MD      . nicotine polacrilex (NICORETTE) gum 2 mg  2 mg Oral PRN Sharma Covert, MD      . omega-3 acid ethyl esters (LOVAZA) capsule 1 g  1 g Oral BID Sharma Covert, MD   1 g at 01/08/19 0744  . prenatal multivitamin tablet 1 tablet  1 tablet Oral Daily Johnn Hai, MD   1 tablet at 01/08/19 0744  . risperiDONE (RISPERDAL) tablet 2 mg  2 mg Oral Daily Johnn Hai, MD   2 mg at 01/08/19 0745  . risperiDONE (RISPERDAL) tablet 4 mg  4 mg Oral QHS Johnn Hai, MD   4 mg at 01/07/19 2038  . risperiDONE microspheres (RISPERDAL CONSTA) injection 50 mg  50 mg Intramuscular Q14 Days Johnn Hai, MD      . temazepam (RESTORIL) capsule 30 mg  30 mg Oral QHS Johnn Hai, MD   30 mg at 01/07/19 2038  . traZODone (DESYREL) tablet 50 mg  50 mg Oral QHS PRN Sharma Covert, MD        Lab Results: No results found for this or any previous visit (from the past 48 hour(s)).  Blood Alcohol level:  Lab Results  Component Value Date   ETH <10 76/28/3151    Metabolic Disorder Labs: Lab Results  Component Value Date   HGBA1C 6.1 (H) 01/04/2016   MPG 128 01/04/2016   MPG 128 (H) 11/19/2014   Lab Results  Component Value Date   PROLACTIN 7.5 12/09/2012   Lab Results  Component Value Date   CHOL 192 (H) 01/04/2016   TRIG 159 (H) 01/04/2016   HDL 44 01/04/2016   CHOLHDL 4.4 01/04/2016   VLDL 32 (H) 01/04/2016   LDLCALC 116 (H) 01/04/2016   LDLCALC 101 11/19/2014    Physical Findings: AIMS: Facial and Oral Movements Muscles of Facial Expression: None, normal Lips and Perioral Area: None, normal Jaw: None, normal Tongue: None, normal,Extremity Movements Upper (arms, wrists, hands, fingers): None, normal Lower (legs, knees, ankles, toes): None, normal, Trunk Movements Neck, shoulders, hips: None, normal, Overall Severity Severity of abnormal movements (highest score from questions above): None, normal Incapacitation due to abnormal movements:  None, normal Patient's awareness of abnormal movements (rate only patient's report): No Awareness, Dental Status Current problems with teeth and/or dentures?: No Does patient usually wear dentures?: No  CIWA:  CIWA-Ar Total: 1 COWS:  COWS Total Score: 1  Musculoskeletal: Strength & Muscle Tone: within normal limits Gait & Station: normal Patient leans: n/a  Psychiatric Specialty Exam: Physical Exam  ROS  Blood pressure 112/80, pulse 73, temperature 97.9 F (36.6 C), temperature source Oral, resp. rate 16, height '5\' 7"'$  (1.702 m), weight 94.3 kg, last menstrual period 12/17/2018.Body mass index is 32.58 kg/m.  General Appearance: Casual  Eye Contact:  Good  Speech:  Clear and Coherent  Volume:  Normal  Mood:  Irritable  Affect:  Constricted overall congruent  Thought Process:  Linear and Descriptions of Associations: Tangential  Orientation:  Full (Time, Place, and Person)  Thought Content:  Logical  Suicidal Thoughts:  No  Homicidal Thoughts:  No  Memory:  Immediate;   Good  Judgement:  Good  Insight:  Good  Psychomotor Activity:  Normal  Concentration:  Concentration: Good  Recall:  Good  Fund of Knowledge:  Good  Language:  Good  Akathisia:  Negative  Handed:  Right  AIMS (if indicated):     Assets:  Physical Health Resilience  ADL's:  Intact  Cognition:  WNL  Sleep:  Number of Hours: 6.75     Treatment Plan Summary: Daily contact with patient to assess and evaluate symptoms and progress in treatment and Medication management continue reality-based therapy administer long-acting injectable today and discharge tomorrow  Johnn Hai, MD 01/08/2019, 1:19 PM

## 2019-01-08 NOTE — Progress Notes (Signed)
D: Pt denies SI/HI/AV hallucinations. Pt is pleasant and cooperative. Pt goal for today is to get ready for discharge. Patient is excited about going home.  A: Pt was offered support and encouragement. Pt was given scheduled medications. Pt was encourage to attend groups. Q 15 minute checks were done for safety.  R:Pt. interacts well with peers and staff. Pt is taking medication. Pt has no complaints.Pt receptive to treatment and safety maintained on unit.

## 2019-01-08 NOTE — Plan of Care (Signed)
Progress note  D: pt presented to the medication window; pleasant and bright. Pt denies any pain or physical complaints. Pt is appropriate and assertive. Pt compliant with medication administration. Pt did call her ride but wasn't being discharged today. Pt denies si/hi/ah/vh and verbally agrees to approach staff if these become apparent or before harming herself/others while at [place].  A: pt provided support and encouragement. Pt given medication per protocol and standing orders. Q34m safety checks implemented and continued.  R: pt safe on the unit. Will continue to monitor.   Pt progressing in the following metrics  Problem: Education: Goal: Mental status will improve Outcome: Progressing Goal: Verbalization of understanding the information provided will improve Outcome: Progressing   Problem: Activity: Goal: Sleeping patterns will improve Outcome: Progressing   Problem: Coping: Goal: Ability to verbalize frustrations and anger appropriately will improve Outcome: Progressing

## 2019-01-08 NOTE — Progress Notes (Deleted)
Recreation Therapy Notes  Date: 01/08/2019 Time: 10:00 am Location: 500 hall   Group Topic: Triggers  Goal Area(s) Addresses:  Patient will work on worksheet on Triggers. Patient will follow directions on first prompt.  Behavioral Response: Appropriate  Intervention: Worksheet  Activity:  Staff on [address] hall were provided with a worksheet on Triggers. Staff was instructed to give it to the patients and have them work on it in place of [place]. Staff was also given 2 coloring sheets and 2 word search and were given the option to give them out.  Education:  Ability to follow Directions, Change of thought processes Discharge Planning, Goal Planning.   Education Outcome: Acknowledges education/In group clarification offered  Clinical Observations/Feedback: . Due to COVID-19, guidelines group was not held. Group members were provided a learning activity worksheet to work on the topic and above-stated goals. LRT is available to answer any questions patient may have regarding the worksheet.  Tomi Likens, LRT/CTRS         Maurizio Geno L Nikolis Berent 01/08/2019 1:07 PM

## 2019-01-09 MED ORDER — RISPERIDONE MICROSPHERES ER 50 MG IM SRER: 50.0000 mg | INTRAMUSCULAR | 11 refills | Status: DC

## 2019-01-09 MED ORDER — PRENATAL MULTIVITAMIN CH: 1.0000 | ORAL_TABLET | Freq: Every day | ORAL | 2 refills | Status: DC

## 2019-01-09 MED ORDER — TRAZODONE HCL 150 MG PO TABS: 150.0000 mg | ORAL_TABLET | Freq: Every evening | ORAL | 2 refills | Status: DC | PRN

## 2019-01-09 MED ORDER — RISPERIDONE 2 MG PO TABS: ORAL_TABLET | ORAL | 2 refills | Status: DC

## 2019-01-09 MED ORDER — OMEGA-3-ACID ETHYL ESTERS 1 G PO CAPS: 1.0000 g | ORAL_CAPSULE | Freq: Two times a day (BID) | ORAL | 2 refills | Status: DC

## 2019-01-09 MED ORDER — BENZTROPINE MESYLATE 1 MG PO TABS: 1.0000 mg | ORAL_TABLET | Freq: Two times a day (BID) | ORAL | 2 refills | Status: DC

## 2019-01-09 NOTE — Progress Notes (Signed)
Patient ID: Briana Lynch, female   DOB: 10-22-1995, 23 y.o.   MRN: [id number]  D: Pt alert and oriented on the unit.   A: Education, support, and encouragement provided. Discharge summary, medications and follow up appointments reviewed with pt. Suicide prevention resources provided, including "My 3 App." Pt's belongings in locker # 61 returned and belongings sheet signed.  R: Pt denies SI/HI, A/VH, pain, or any concerns at this time. Pt ambulatory on and off unit. Pt discharged to lobby.
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
## 2019-01-09 NOTE — Plan of Care (Signed)
  Problem: Education: Goal: Emotional status will improve Outcome: Progressing Goal: Mental status will improve Outcome: Progressing   Problem: Activity: Goal: Sleeping patterns will improve Outcome: Progressing   Problem: Health Behavior/Discharge Planning: Goal: Compliance with treatment plan for underlying cause of condition will improve Outcome: Progressing   Problem: Safety: Goal: Periods of time without injury will increase Outcome: Progressing   Problem: Education: Goal: Knowledge of the prescribed therapeutic regimen will improve Outcome: Progressing

## 2019-01-09 NOTE — Progress Notes (Signed)
Patient ID: Briana Lynch, female   DOB: 07-24-1995, 23 y.o.   MRN: [id number]   [place] NOVEL CORONAVIRUS (COVID-19) DAILY CHECK-OFF SYMPTOMS - answer yes or no to each - every day NO YES  Have you had a fever in the past 24 hours?  . Fever (Temp > 37.80C / 100F) X   Have you had any of these symptoms in the past 24 hours? . New Cough .  Sore Throat  .  Shortness of Breath .  Difficulty Breathing .  Unexplained Body Aches   X   Have you had any one of these symptoms in the past 24 hours not related to allergies?   . Runny Nose .  Nasal Congestion .  Sneezing   X   If you have had runny nose, nasal congestion, sneezing in the past 24 hours, has it worsened?  X   EXPOSURES - check yes or no X   Have you traveled outside the state in the past 14 days?  X   Have you been in contact with someone with a confirmed diagnosis of COVID-19 or PUI in the past 14 days without wearing appropriate PPE?  X   Have you been living in the same home as a person with confirmed diagnosis of COVID-19 or a PUI (household contact)?    X   Have you been diagnosed with COVID-19?    X              What to do next: Answered NO to all: Answered YES to anything:   Proceed with unit schedule Follow the [facility] Inpatient Flowsheet.

## 2019-01-09 NOTE — Progress Notes (Signed)
CSW spoke to mother, Rutherford Guys [phone], regarding discharge.   Mother will pick up patient at 11:00am.  Stephanie Acre, [place] Worker

## 2019-01-09 NOTE — [facility] Suicide Risk Assessment (Signed)
[facility] Discharge Suicide Risk Assessment   Principal Problem: New onset psychosis Discharge Diagnoses: Active Problems:   Psychosis ([place])   Total Time spent with patient: 45 minutes  Musculoskeletal: Strength & Muscle Tone: within normal limits Gait & Station: normal Patient leans: N/A  Psychiatric Specialty Exam: ROS  Blood pressure 112/80, pulse 73, temperature 97.9 F (36.6 C), temperature source Oral, resp. rate 16, height 5\' 7"  (1.702 m), weight 94.3 kg, last menstrual period 12/17/2018.Body mass index is 32.58 kg/m.  General Appearance: Casual  Eye Contact::  Good  Speech:  Clear and [id number]  Volume:  Normal  Mood:  Euthymic  Affect:  Constricted  Thought Process:  Coherent  Orientation:  Full (Time, Place, and Person)  Thought Content:  Tangential  Suicidal Thoughts:  No  Homicidal Thoughts:  No  Memory:  Immediate;   Fair  Judgement:  Fair  Insight:  Fair  Psychomotor Activity:  Normal  Concentration:  Good  Recall:  [employer] of Knowledge:Fair  Language: Fair  Akathisia:  Negative  Handed:  Right  AIMS (if indicated):     Assets:  Communication Skills Desire for Improvement Housing Physical Health Resilience Social Support Talents/Skills  Sleep:  Number of Hours: 6.75  Cognition: WNL  ADL's:  Intact   Mental Status Per Nursing Assessment::   On Admission:  NA  Demographic Factors:  NA  Loss Factors: Decrease in vocational status  Historical Factors: NA  Risk Reduction Factors:   Sense of responsibility to family and Religious beliefs about death  Continued Clinical Symptoms:  No evidence of current psychosis  Cognitive Features That Contribute To Risk:  Loss of executive function    Suicide Risk:  Minimal: No identifiable suicidal ideation.  Patients presenting with no risk factors but with morbid ruminations; may be classified as minimal risk based on the severity of the depressive symptoms  [place] Follow up on 01/15/2019.   Why: Therapy appointment with Catalina Lunger is Tuesday, 7/2 at 10:30a.  Medication management appointment with Selinda Eon is Monday, 7/6 at 11:30a.  Please call within 24 hours of discharge to confirm appts and pay $20 deposit. Appts will be virtually done.  Contact information: [address] [place] [address] [phone]           Plan Of Care/Follow-up recommendations:  Activity:  full  Leroi Haque, MD 01/09/2019, 7:49 AM

## 2019-01-09 NOTE — Progress Notes (Signed)
  [facility] Adult Case Management Discharge Plan :  Will you be returning to the same living situation after discharge:  Yes,  home At discharge, do you have transportation home?: Yes,  mom will pick up at 11:00am Do you have the ability to pay for your medications: Yes,  BCBS  Release of information consent forms completed and in the chart.  Patient to Follow up at: [facility] Follow up on 01/15/2019.   Why: Therapy appointment with Catalina Lunger is Tuesday, 7/2 at 10:30a.  Medication management appointment with Selinda Eon is Monday, 7/6 at 11:30a.  Please call within 24 hours of discharge to confirm appts and pay $20 deposit. Appts will be virtually done.  Contact information: [place] [address] [phone]           Next level of care provider has access to [place] and Suicide Prevention discussed: Yes,  with parents  Have you used any form of tobacco in the last 30 days? (Cigarettes, Smokeless Tobacco, Cigars, and/or Pipes): Yes  Has patient been referred to the Quitline?: Patient refused referral  Patient has been referred for addiction treatment: Yes  Joellen Jersey, [place] 01/09/2019, 9:28 AM

## 2019-01-09 NOTE — Progress Notes (Signed)
Recreation Therapy Notes  INPATIENT RECREATION TR PLAN  Patient Details Name: Briana Lynch MRN: [id number] DOB: May 15, 1996 Today's Date: 01/09/2019  Rec Therapy Plan Is patient appropriate for Therapeutic Recreation?: Yes Treatment times per week: 3-5 times per week Estimated Length of Stay: 5-7 days TR Treatment/Interventions: Group participation (Comment)  Discharge Criteria Pt will be discharged from therapy if:: Discharged Treatment plan/goals/alternatives discussed and agreed upon by:: Patient/family  Discharge Summary Short term goals set: see patient care plan Short term goals met: Adequate for discharge Progress toward goals comments: Groups attended Which groups?: Goal setting, Anger management, Other (Comment)(intoduction to Anxiety, Goal planning, Anger Triggers and Managment, Triggers) Reason goals not met: n/a Therapeutic equipment acquired: none Reason patient discharged from therapy: Discharge from hospital Pt/family agrees with progress & goals achieved: Yes Date patient discharged from therapy: 01/09/19  Tomi Likens, LRT/CTRS   [place] 01/09/2019, 2:19 PM

## 2019-01-09 NOTE — Tx Team (Signed)
Interdisciplinary Treatment and Diagnostic Plan Update  01/09/2019 Time of Session: 09:34am Briana Lynch MRN: [id number]  Principal Diagnosis: <principal problem not specified>  Secondary Diagnoses: Active Problems:   Psychosis (HCC)   Current Medications:  Current Facility-Administered Medications  Medication Dose Route Frequency Provider Last Rate Last Dose  . acetaminophen (TYLENOL) tablet 650 mg  650 mg Oral Q6H PRN Antonieta Pertlary, Greg Lawson, MD      . alum & mag hydroxide-simeth (MAALOX/MYLANTA) 200-200-20 MG/5ML suspension 30 mL  30 mL Oral Q4H PRN Antonieta Pertlary, Greg Lawson, MD      . benztropine (COGENTIN) tablet 0.5 mg  0.5 mg Oral BID Malvin JohnsFarah, Brian, MD   0.5 mg at 01/09/19 0836  . hydrOXYzine (ATARAX/VISTARIL) tablet 25 mg  25 mg Oral TID PRN Antonieta Pertlary, Greg Lawson, MD      . LORazepam (ATIVAN) tablet 2 mg  2 mg Oral Q4H PRN Malvin JohnsFarah, Brian, MD       Or  . LORazepam (ATIVAN) injection 2 mg  2 mg Intramuscular Q4H PRN Malvin JohnsFarah, Brian, MD      . magnesium hydroxide (MILK OF MAGNESIA) suspension 30 mL  30 mL Oral Daily PRN Antonieta Pertlary, Greg Lawson, MD      . nicotine polacrilex (NICORETTE) gum 2 mg  2 mg Oral PRN Antonieta Pertlary, Greg Lawson, MD      . omega-3 acid ethyl esters (LOVAZA) capsule 1 g  1 g Oral BID Antonieta Pertlary, Greg Lawson, MD   1 g at 01/09/19 0836  . prenatal multivitamin tablet 1 tablet  1 tablet Oral Daily Malvin JohnsFarah, Brian, MD   1 tablet at 01/09/19 [phone]  . risperiDONE (RISPERDAL) tablet 2 mg  2 mg Oral Daily Malvin JohnsFarah, Brian, MD   2 mg at 01/09/19 0837  . risperiDONE (RISPERDAL) tablet 4 mg  4 mg Oral QHS Malvin JohnsFarah, Brian, MD   4 mg at 01/08/19 2053  . risperiDONE microspheres (RISPERDAL CONSTA) injection 50 mg  50 mg Intramuscular Q14 Days Malvin JohnsFarah, Brian, MD   50 mg at 01/08/19 1759  . temazepam (RESTORIL) capsule 30 mg  30 mg Oral QHS Malvin JohnsFarah, Brian, MD   30 mg at 01/08/19 2053  . traZODone (DESYREL) tablet 50 mg  50 mg Oral QHS PRN Antonieta Pertlary, Greg Lawson, MD       Current Outpatient Medications  Medication Sig Dispense  Refill  . benztropine (COGENTIN) 1 MG tablet Take 1 tablet (1 mg total) by mouth 2 (two) times daily. 60 tablet 2  . metFORMIN (GLUCOPHAGE-XR) 500 MG 24 hr tablet TAKE 3 TABLETS EVERY DAY (Patient not taking: Reported on 01/03/2019) 90 tablet 3  . omega-3 acid ethyl esters (LOVAZA) 1 g capsule Take 1 capsule (1 g total) by mouth 2 (two) times daily. 60 capsule 2  . Prenatal Vit-Fe Fumarate-FA (PRENATAL MULTIVITAMIN) TABS tablet Take 1 tablet by mouth daily. 90 tablet 2  . risperiDONE (RISPERDAL) 2 MG tablet 1 in am 2 at hs x 7 days  Then 2 at hs only 90 tablet 2  . [START ON 01/22/2019] risperiDONE microspheres (RISPERDAL CONSTA) 50 MG injection Inject 2 mLs (50 mg total) into the muscle every 14 (fourteen) days. May take next dose in 21 days - 7/17 1 each 11  . traZODone (DESYREL) 150 MG tablet Take 1 tablet (150 mg total) by mouth at bedtime as needed for sleep. 30 tablet 2   PTA Medications: No medications prior to admission.    Patient Stressors: Traumatic event  Patient Strengths: Active sense of humor Average or above  average intelligence Supportive family/friends  Treatment Modalities: Medication Management, Group therapy, Case management,  1 to 1 session with clinician, Psychoeducation, Recreational therapy.   Physician Treatment Plan for Primary Diagnosis: <principal problem not specified> Long Term Goal(s): Improvement in symptoms so as ready for discharge Improvement in symptoms so as ready for discharge   Short Term Goals: Ability to identify changes in lifestyle to reduce recurrence of condition will improve Ability to verbalize feelings will improve Ability to disclose and discuss suicidal ideas Ability to demonstrate self-control will improve Ability to identify and develop effective coping behaviors will improve Ability to maintain clinical measurements within normal limits will improve Ability to identify triggers associated with substance abuse/mental health issues  will improve Ability to identify changes in lifestyle to reduce recurrence of condition will improve Ability to verbalize feelings will improve Ability to disclose and discuss suicidal ideas Ability to demonstrate self-control will improve Ability to identify and develop effective coping behaviors will improve Ability to maintain clinical measurements within normal limits will improve Ability to identify triggers associated with substance abuse/mental health issues will improve  Medication Management: Evaluate patient's response, side effects, and tolerance of medication regimen.  Therapeutic Interventions: 1 to 1 sessions, Unit Group sessions and Medication administration.  Evaluation of Outcomes: Adequate for Discharge  Physician Treatment Plan for Secondary Diagnosis: Active Problems:   Psychosis ([place])  Long Term Goal(s): Improvement in symptoms so as ready for discharge Improvement in symptoms so as ready for discharge   Short Term Goals: Ability to identify changes in lifestyle to reduce recurrence of condition will improve Ability to verbalize feelings will improve Ability to disclose and discuss suicidal ideas Ability to demonstrate self-control will improve Ability to identify and develop effective coping behaviors will improve Ability to maintain clinical measurements within normal limits will improve Ability to identify triggers associated with substance abuse/mental health issues will improve Ability to identify changes in lifestyle to reduce recurrence of condition will improve Ability to verbalize feelings will improve Ability to disclose and discuss suicidal ideas Ability to demonstrate self-control will improve Ability to identify and develop effective coping behaviors will improve Ability to maintain clinical measurements within normal limits will improve Ability to identify triggers associated with substance abuse/mental health issues will improve     Medication  Management: Evaluate patient's response, side effects, and tolerance of medication regimen.  Therapeutic Interventions: 1 to 1 sessions, Unit Group sessions and Medication administration.  Evaluation of Outcomes: Adequate for Discharge   RN Treatment Plan for Primary Diagnosis: <principal problem not specified> Long Term Goal(s): Knowledge of disease and therapeutic regimen to maintain health will improve  Short Term Goals: Ability to participate in decision making will improve, Ability to verbalize feelings will improve, Ability to disclose and discuss suicidal ideas, Ability to identify and develop effective coping behaviors will improve and Compliance with prescribed medications will improve  Medication Management: RN will administer medications as ordered by provider, will assess and evaluate patient's response and provide education to patient for prescribed medication. RN will report any adverse and/or side effects to prescribing provider.  Therapeutic Interventions: 1 on 1 counseling sessions, Psychoeducation, Medication administration, Evaluate responses to treatment, Monitor vital signs and CBGs as ordered, Perform/monitor CIWA, COWS, AIMS and Fall Risk screenings as ordered, Perform wound care treatments as ordered.  Evaluation of Outcomes: Adequate for Discharge   LCSW Treatment Plan for Primary Diagnosis: <principal problem not specified> Long Term Goal(s): Safe transition to appropriate next level of care at discharge, Engage patient  in therapeutic group addressing interpersonal concerns.  Short Term Goals: Engage patient in aftercare planning with referrals and resources and Increase skills for wellness and recovery  Therapeutic Interventions: Assess for all discharge needs, 1 to 1 time with Social worker, Explore available resources and support systems, Assess for adequacy in community support network, Educate family and significant other(s) on suicide prevention, Complete  Psychosocial Assessment, Interpersonal group therapy.  Evaluation of Outcomes: Adequate for Discharge   Progress in Treatment: Attending groups: Yes. Participating in groups: Yes. Taking medication as prescribed: No. Toleration medication: No. Family/Significant other contact made: Yes, individual(s) contacted:  pt's mother Patient understands diagnosis: No. Discussing patient identified problems/goals with staff: Yes. Medical problems stabilized or resolved: Yes. Denies suicidal/homicidal ideation: Yes. Issues/concerns per patient self-inventory: No. Other:   New problem(s) identified: No, Describe:  None  New Short Term/Long Term Goal(s): Medication stabilization, elimination of SI thoughts, and development of a comprehensive mental wellness plan.   Patient Goals:  "To go home. I was forced to be here"  Discharge Plan or Barriers: Patient discharged home and will follow up with [facility] for outpatient medication management and therapy services.   Reason for Continuation of Hospitalization:  Estimated Length of Stay: 01/09/2019  Attendees: Patient: 01/05/2019   Physician: Dr. Malvin JohnsBrian Farah, MD  01/05/2019  Nursing: Arlyss RepressAlyssa, RN; Ethelene BrownsAnthony.A, RN 01/05/2019   RN Care Manager: 01/05/2019  Social Worker: Stephannie PetersJasmine Davis, LCSW; [place] Shabria Egley, [place] 01/05/2019   Recreational Therapist:  01/05/2019   Other:  01/05/2019   Other:  01/05/2019  Other: 01/05/2019      Scribe for Treatment Team: Maeola SarahJolan E Nuno Brubacher, LCSWA 01/09/2019 1:05 PM

## 2020-10-23 ENCOUNTER — Other Ambulatory Visit: Payer: Self-pay

## 2020-10-23 ENCOUNTER — Encounter (HOSPITAL_COMMUNITY): Payer: Self-pay

## 2020-10-23 ENCOUNTER — Inpatient Hospital Stay (HOSPITAL_COMMUNITY)
Admission: EM | Admit: 2020-10-23 | Discharge: 2020-10-26 | DRG: 918 | Disposition: A | Discharge status: Home / Self Care | Payer: Self-pay | Attending: Internal Medicine | Admitting: Internal Medicine

## 2020-10-23 ENCOUNTER — Emergency Department (HOSPITAL_COMMUNITY): Payer: Self-pay

## 2020-10-23 DIAGNOSIS — Z833 Family history of diabetes mellitus: Secondary | ICD-10-CM

## 2020-10-23 DIAGNOSIS — S36490A Other injury of duodenum, initial encounter: Secondary | ICD-10-CM | POA: Diagnosis present

## 2020-10-23 DIAGNOSIS — E876 Hypokalemia: Secondary | ICD-10-CM | POA: Diagnosis present

## 2020-10-23 DIAGNOSIS — F4312 Post-traumatic stress disorder, chronic: Secondary | ICD-10-CM | POA: Diagnosis present

## 2020-10-23 DIAGNOSIS — R7303 Prediabetes: Secondary | ICD-10-CM | POA: Diagnosis present

## 2020-10-23 DIAGNOSIS — Z7984 Long term (current) use of oral hypoglycemic drugs: Secondary | ICD-10-CM

## 2020-10-23 DIAGNOSIS — F17210 Nicotine dependence, cigarettes, uncomplicated: Secondary | ICD-10-CM | POA: Diagnosis present

## 2020-10-23 DIAGNOSIS — F333 Major depressive disorder, recurrent, severe with psychotic symptoms: Secondary | ICD-10-CM | POA: Diagnosis present

## 2020-10-23 DIAGNOSIS — F411 Generalized anxiety disorder: Secondary | ICD-10-CM | POA: Diagnosis present

## 2020-10-23 DIAGNOSIS — T1491XA Suicide attempt, initial encounter: Secondary | ICD-10-CM

## 2020-10-23 DIAGNOSIS — F449 Dissociative and conversion disorder, unspecified: Secondary | ICD-10-CM | POA: Diagnosis present

## 2020-10-23 DIAGNOSIS — Z79899 Other long term (current) drug therapy: Secondary | ICD-10-CM

## 2020-10-23 DIAGNOSIS — T5491XA Toxic effect of unspecified corrosive substance, accidental (unintentional), initial encounter: Secondary | ICD-10-CM

## 2020-10-23 DIAGNOSIS — F332 Major depressive disorder, recurrent severe without psychotic features: Secondary | ICD-10-CM

## 2020-10-23 DIAGNOSIS — R112 Nausea with vomiting, unspecified: Secondary | ICD-10-CM

## 2020-10-23 DIAGNOSIS — Z79891 Long term (current) use of opiate analgesic: Secondary | ICD-10-CM

## 2020-10-23 DIAGNOSIS — F4481 Dissociative identity disorder: Secondary | ICD-10-CM | POA: Diagnosis present

## 2020-10-23 DIAGNOSIS — T5492XA Toxic effect of unspecified corrosive substance, intentional self-harm, initial encounter: Principal | ICD-10-CM | POA: Diagnosis present

## 2020-10-23 DIAGNOSIS — Z880 Allergy status to penicillin: Secondary | ICD-10-CM

## 2020-10-23 DIAGNOSIS — K2960 Other gastritis without bleeding: Secondary | ICD-10-CM

## 2020-10-23 DIAGNOSIS — R45851 Suicidal ideations: Secondary | ICD-10-CM | POA: Diagnosis present

## 2020-10-23 DIAGNOSIS — E282 Polycystic ovarian syndrome: Secondary | ICD-10-CM | POA: Diagnosis present

## 2020-10-23 DIAGNOSIS — Z20822 Contact with and (suspected) exposure to covid-19: Secondary | ICD-10-CM | POA: Diagnosis present

## 2020-10-23 DIAGNOSIS — B009 Herpesviral infection, unspecified: Secondary | ICD-10-CM | POA: Diagnosis present

## 2020-10-23 DIAGNOSIS — Z818 Family history of other mental and behavioral disorders: Secondary | ICD-10-CM

## 2020-10-23 HISTORY — DX: Suicide attempt, initial encounter: T14.91XA

## 2020-10-23 LAB — BASIC METABOLIC PANEL
Anion gap: 7 (ref 5–15)
BUN: 6 mg/dL (ref 6–20)
CO2: 23 mmol/L (ref 22–32)
Calcium: 8.6 mg/dL — ABNORMAL LOW (ref 8.9–10.3)
Chloride: 110 mmol/L (ref 98–111)
Creatinine, Ser: 0.77 mg/dL (ref 0.44–1.00)
GFR, Estimated: >60 mL/min (ref >60)
Glucose, Bld: 102 mg/dL — ABNORMAL HIGH (ref 70–99)
Potassium: 3.7 mmol/L (ref 3.5–5.1)
Sodium: 140 mmol/L (ref 135–145)

## 2020-10-23 LAB — CBC WITH DIFFERENTIAL/PLATELET
Abs Immature Granulocytes: 0.04 10*3/uL (ref 0.00–0.07)
Basophils Absolute: 0 10*3/uL (ref 0.0–0.1)
Basophils Relative: 1 %
Eosinophils Absolute: 0.1 10*3/uL (ref 0.0–0.5)
Eosinophils Relative: 1 %
HCT: 38.2 % (ref 36.0–46.0)
Hemoglobin: 12.5 g/dL (ref 12.0–15.0)
Immature Granulocytes: 1 %
Lymphocytes Relative: 32 %
Lymphs Abs: 2.1 10*3/uL (ref 0.7–4.0)
MCH: 29.4 pg (ref 26.0–34.0)
MCHC: 32.7 g/dL (ref 30.0–36.0)
MCV: 89.9 fL (ref 80.0–100.0)
Monocytes Absolute: 0.4 10*3/uL (ref 0.1–1.0)
Monocytes Relative: 7 %
Neutro Abs: 3.9 10*3/uL (ref 1.7–7.7)
Neutrophils Relative %: 58 %
Platelets: 293 10*3/uL (ref 150–400)
RBC: 4.25 MIL/uL (ref 3.87–5.11)
RDW: 14 % (ref 11.5–15.5)
WBC: 6.5 10*3/uL (ref 4.0–10.5)
nRBC: 0 % (ref 0.0–0.2)

## 2020-10-23 LAB — ETHANOL: Alcohol, Ethyl (B): <10 mg/dL (ref <10)

## 2020-10-23 LAB — COMPREHENSIVE METABOLIC PANEL
ALT: 13 U/L (ref 0–44)
AST: 14 U/L — ABNORMAL LOW (ref 15–41)
Albumin: 3.9 g/dL (ref 3.5–5.0)
Alkaline Phosphatase: 72 U/L (ref 38–126)
Anion gap: 8 (ref 5–15)
BUN: 6 mg/dL (ref 6–20)
CO2: 22 mmol/L (ref 22–32)
Calcium: 9.1 mg/dL (ref 8.9–10.3)
Chloride: 111 mmol/L (ref 98–111)
Creatinine, Ser: 0.83 mg/dL (ref 0.44–1.00)
GFR, Estimated: >60 mL/min (ref >60)
Glucose, Bld: 125 mg/dL — ABNORMAL HIGH (ref 70–99)
Potassium: 3.4 mmol/L — ABNORMAL LOW (ref 3.5–5.1)
Sodium: 141 mmol/L (ref 135–145)
Total Bilirubin: 0.6 mg/dL (ref 0.3–1.2)
Total Protein: 6.9 g/dL (ref 6.5–8.1)

## 2020-10-23 LAB — RESP PANEL BY RT-PCR (FLU A&B, COVID) ARPGX2
Influenza A by PCR: NEGATIVE
Influenza B by PCR: NEGATIVE
SARS Coronavirus 2 by RT PCR: NEGATIVE

## 2020-10-23 LAB — I-STAT BETA HCG BLOOD, ED (MC, WL, AP ONLY): I-stat hCG, quantitative: <5 m[IU]/mL (ref <5)

## 2020-10-23 LAB — ACETAMINOPHEN LEVEL: Acetaminophen (Tylenol), Serum: <10 ug/mL — ABNORMAL LOW (ref 10–30)

## 2020-10-23 LAB — SALICYLATE LEVEL: Salicylate Lvl: <7 mg/dL — ABNORMAL LOW (ref 7.0–30.0)

## 2020-10-23 MED ORDER — POTASSIUM CHLORIDE 10 MEQ/100ML IV SOLN
10.0000 meq | INTRAVENOUS | Status: AC
  Administered 2020-10-23 – 2020-10-24 (×4): 10 meq via INTRAVENOUS
  Filled 2020-10-23 (×5): qty 100

## 2020-10-23 MED ORDER — PANTOPRAZOLE SODIUM 40 MG IV SOLR: 40.0000 mg | Freq: Two times a day (BID) | INTRAVENOUS | Status: DC

## 2020-10-23 MED ORDER — ONDANSETRON HCL 4 MG/2ML IJ SOLN
4.0000 mg | Freq: Once | INTRAMUSCULAR | Status: AC
  Administered 2020-10-23: 4 mg via INTRAVENOUS
  Filled 2020-10-23: qty 2

## 2020-10-23 MED ORDER — SODIUM CHLORIDE 0.9 % IV SOLN
80.0000 mg | Freq: Once | INTRAVENOUS | Status: AC
  Administered 2020-10-23: 80 mg via INTRAVENOUS
  Filled 2020-10-23: qty 80

## 2020-10-23 MED ORDER — SODIUM CHLORIDE 0.9 % IV SOLN: INTRAVENOUS | Status: AC

## 2020-10-23 MED ORDER — SODIUM CHLORIDE 0.9 % IV BOLUS
1000.0000 mL | Freq: Once | INTRAVENOUS | Status: AC
  Administered 2020-10-23: 1000 mL via INTRAVENOUS

## 2020-10-23 MED ORDER — SODIUM CHLORIDE 0.9 % IV SOLN
8.0000 mg/h | INTRAVENOUS | Status: DC
  Administered 2020-10-23 – 2020-10-24 (×2): 8 mg/h via INTRAVENOUS
  Filled 2020-10-23 (×2): qty 80

## 2020-10-23 NOTE — Plan of Care (Signed)

## 2020-10-23 NOTE — ED Notes (Addendum)
Pt belongings inventoried. Security to wand this patient and her belongings at this time.   Items placed in locker #3.

## 2020-10-23 NOTE — ED Notes (Signed)
pts belongings inventoried and stored, psych packet complete

## 2020-10-23 NOTE — ED Notes (Signed)
Attempted to give report, this rn told pt cannot come upstairs w/o sitter. ED Charge nurse notified.

## 2020-10-23 NOTE — ED Provider Notes (Signed)
[facility] Provider Note   CSN: [id number] Arrival date & time: 10/23/20  1738     History Chief Complaint  Patient presents with  . Suicide Attempt  . Ingestion    Briana Lynch is a 25 y.o. female with PMH/o psychosis with schizoprenia BIB EMS for evaluation of drinking bleach.  I discussed with mom who provides additional history.  She states that patient called her dad and stated that she needed him to come home.  As she was talking to the dad, she told him that she had drank a bottle of bleach.  The younger sister was at home at that time and overheard and saw.  She states that patient then started vomiting and had an episode of vomiting bright red blood.  Mom states that it was a smaller bottle of bleach but does not know how many exact ounces.  She states that the bottle was almost half gone.  Patient did at 1 point tell EMS that she took some pills but did not tell them how many or what she took.  Mom states that the only pills that she would have access to is some HCTZ pills but mom looked at the bottle and noted that it was not missing any pills.  Mom does state that she has some weight loss pills that she has as well but mom states that she thinks that patient did not have them.  They do not have any over-the-counter pills that patient would have access to.   EM LEVEL 5 CAVEAT DUE TO PSYCHIATRIC ILLNESS  Patient's mom can be reached at [phone]. She would like to be updated.   The history is provided by the patient.       Past Medical History:  Diagnosis Date  . Ankle sprain and strain 11/15/2010  . Anxiety state 12/09/2012  . Medical history non-contributory   . PCOS (polycystic ovarian syndrome)   . Right ankle sprain 11/15/2010    Patient Active Problem List   Diagnosis Date Noted  . Suicide attempt (HCC) 10/23/2020  . Chronic post-traumatic stress disorder (PTSD) 01/04/2019  . Psychosis (HCC) 01/04/2019  . 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 Iron deficiency anemia 10/19/2014  . Vitamin D deficiency 08/20/2014  . Prediabetes 08/20/2014  . Seborrhea capitis 05/18/2014  . PCOS (polycystic ovarian syndrome) 03/03/2013  . Presence of subdermal contraceptive device 03/03/2013  . Urinary incontinence 12/11/2012  . Irregular menses 12/09/2012  . Acne 12/09/2012  . Hirsutism 12/09/2012  . BMI (body mass index), pediatric, > 99% for age 54/27/2014    History reviewed. No pertinent surgical history.   OB History   No obstetric history on file.     Family History  Problem Relation Age of Onset  . Diabetes Mother   . Hypertension Mother   . Mental illness Sister     Social History   Tobacco Use  . Smoking status: Current Some Day Smoker    Packs/day: 1.00    Types: Cigarettes  . Smokeless tobacco: Never Used  . Tobacco comment: Dad smokes  Vaping Use  . Vaping Use: Never used  Substance Use Topics  . Alcohol use: Not Currently    Alcohol/week: 0.0 standard drinks  . Drug use: Yes    Types: Marijuana    Comment: infrequent    Home Medications Prior to Admission medications   Medication Sig Start Date End Date Taking? Authorizing Provider  benztropine (COGENTIN) 1 MG tablet  Take 1 tablet (1 mg total) by mouth 2 (two) times daily. Patient not taking: Reported on 10/23/2020 01/09/19   Malvin Johns, MD  metFORMIN (GLUCOPHAGE-XR) 500 MG 24 hr tablet TAKE 3 TABLETS EVERY DAY Patient not taking: Reported on 10/23/2020 03/16/16   Georges Mouse, NP  omega-3 acid ethyl esters (LOVAZA) 1 g capsule Take 1 capsule (1 g total) by mouth 2 (two) times daily. Patient not taking: Reported on 10/23/2020 01/09/19   Malvin Johns, MD  Prenatal Vit-Fe Fumarate-FA (PRENATAL MULTIVITAMIN) TABS tablet Take 1 tablet by mouth daily. Patient not taking: Reported on 10/23/2020 01/09/19   Malvin Johns, MD  risperiDONE (RISPERDAL) 2 MG tablet 1 in am 2 at hs x 7 days  Then 2 at hs only Patient not taking:  Reported on 10/23/2020 01/09/19   Malvin Johns, MD  risperiDONE microspheres (RISPERDAL CONSTA) 50 MG injection Inject 2 mLs (50 mg total) into the muscle every 14 (fourteen) days. May take next dose in 21 days - 7/17 Patient not taking: Reported on 10/23/2020 01/22/19   Malvin Johns, MD  traZODone (DESYREL) 150 MG tablet Take 1 tablet (150 mg total) by mouth at bedtime as needed for sleep. Patient not taking: Reported on 10/23/2020 01/09/19   Malvin Johns, MD    Allergies    Penicillins  Review of Systems   Review of Systems  Unable to perform ROS: Psychiatric disorder    Physical Exam Updated Vital Signs BP 124/89 (BP Location: Right Arm)   Pulse 79   Temp 99.4 F (37.4 C) (Oral)   Resp 15   Ht  (1.702 m)   Wt 94 kg   SpO2 100%   BMI 32.46 kg/m   Physical Exam Vitals and nursing note reviewed.  Constitutional:      Appearance: Normal appearance. She is well-developed.     Comments: Drowsy but arousable to verbal stimuli, painful stimuli  HENT:     Head: Normocephalic and atraumatic.     Comments: Dried blood around mouth and nare Eyes:     General: Lids are normal.     Conjunctiva/sclera: Conjunctivae normal.     Pupils: Pupils are equal, round, and reactive to light.     Comments: PERRL. EOMs intact. No nystagmus. No neglect.   Cardiovascular:     Rate and Rhythm: Regular rhythm. Tachycardia present.     Pulses: Normal pulses.     Heart sounds: Normal heart sounds. No murmur heard. No friction rub. No gallop.   Pulmonary:     Effort: Pulmonary effort is normal.     Breath sounds: Normal breath sounds.     Comments: Lungs clear to auscultation bilaterally.  Symmetric chest rise.  No wheezing, rales, rhonchi. Abdominal:     Palpations: Abdomen is soft. Abdomen is not rigid.     Tenderness: There is generalized abdominal tenderness. There is no guarding.     Comments: Abdomen is soft, non-distended. Generalized tenderness with no focal point.. No rigidity, No  guarding. No peritoneal signs.   Musculoskeletal:        General: Normal range of motion.     Cervical back: Full passive range of motion without pain.  Skin:    General: Skin is warm and dry.     Capillary Refill: Capillary refill takes less than 2 seconds.  Neurological:     Mental Status: She is oriented to person, place, and time.     Comments: Follow some commands.  Alert and oriented x3.  Psychiatric:  Speech: Speech normal.     ED Results / Procedures / Treatments   Labs (all labs ordered are listed, but only abnormal results are displayed) Labs Reviewed  COMPREHENSIVE METABOLIC PANEL - Abnormal; Notable for the following components:      Result Value   Potassium 3.4 (*)    Glucose, Bld 125 (*)    AST 14 (*)    All other components within normal limits  SALICYLATE LEVEL - Abnormal; Notable for the following components:   Salicylate Lvl <7.0 (*)    All other components within normal limits  ACETAMINOPHEN LEVEL - Abnormal; Notable for the following components:   Acetaminophen (Tylenol), Serum <10 (*)    All other components within normal limits  RESP PANEL BY RT-PCR (FLU A&B, COVID) ARPGX2  CBC WITH DIFFERENTIAL/PLATELET  ETHANOL  URINALYSIS, ROUTINE W REFLEX MICROSCOPIC  RAPID URINE DRUG SCREEN, HOSP PERFORMED  HIV ANTIBODY (ROUTINE TESTING W REFLEX)  BASIC METABOLIC PANEL  CBC  BASIC METABOLIC PANEL  I-STAT BETA HCG BLOOD, ED ([facility], [facility], [facility] ONLY)    EKG EKG Interpretation  Date/Time:  Sunday October 23 2020 17:46:37 EDT Ventricular Rate:  112 PR Interval:  158 QRS Duration: 85 QT Interval:  323 QTC Calculation: 441 R Axis:   65 Text Interpretation: Sinus tachycardia Confirmed by Cathren Laine ([id number]) on 10/23/2020 5:56:32 PM   Radiology DG Chest Portable 1 View  Result Date: 10/23/2020 CLINICAL DATA:  25 year old who drank approximately 8 ounces of bleach and ingested unspecified medications earlier today. Hematemesis. EXAM: PORTABLE CHEST 1 VIEW  COMPARISON:  None. FINDINGS: Suboptimal inspiration accounts for crowded bronchovascular markings, especially in the bases, and accentuates the cardiac silhouette. Taking this into account, cardiomediastinal silhouette unremarkable. Lungs clear. Bronchovascular markings normal. Pulmonary vascularity normal. No visible pleural effusions. No pneumothorax. IMPRESSION: Suboptimal inspiration. No acute cardiopulmonary disease. Electronically Signed   By: Hulan Saas M.D.   On: 10/23/2020 19:20    Procedures .Critical Care Performed by: Maxwell Caul, PA-C Authorized by: Maxwell Caul, PA-C   Critical care provider statement:    Critical care time (minutes):  35   Critical care was necessary to treat or prevent imminent or life-threatening deterioration of the following conditions: SI.   Critical care was time spent personally by me on the following activities:  Discussions with consultants, evaluation of patient's response to treatment, examination of patient, ordering and performing treatments and interventions, ordering and review of laboratory studies, ordering and review of radiographic studies, pulse oximetry, re-evaluation of patient's condition, obtaining history from patient or surrogate and review of old charts     Medications Ordered in ED Medications  pantoprazole (PROTONIX) 80 mg in sodium chloride 0.9 % 100 mL (0.8 mg/mL) infusion (8 mg/hr Intravenous New Bag/Given 10/23/20 1944)  pantoprazole (PROTONIX) injection 40 mg (has no administration in time range)  0.9 %  sodium chloride infusion (has no administration in time range)  potassium chloride 10 mEq in 100 mL IVPB (has no administration in time range)  sodium chloride 0.9 % bolus 1,000 mL (0 mLs Intravenous Stopped 10/23/20 1946)  ondansetron (ZOFRAN) injection 4 mg (4 mg Intravenous Given 10/23/20 1806)  pantoprazole (PROTONIX) 80 mg in sodium chloride 0.9 % 100 mL IVPB (0 mg Intravenous Stopped 10/23/20 2055)    ED  Course  I have reviewed the triage vital signs and the nursing notes.  Pertinent labs & imaging results that were available during my care of the patient were reviewed by me and considered in my  medical decision making (see chart for details).    MDM Rules/Calculators/A&P                          25 year old female past mixture of psychosis, schizophrenia brought in by EMS for possible bleach ingestion, pills ingestion.  Patient is not forthcoming with history and does not tell me what she took.  She just repeatedly says "I do not feel well."  On initially arrival, she is afebrile, appears uncomfortable.  She is tachycardic.  Blood pressure stable.  She is slightly tachypneic.  She is drowsy but arousable to verbal stimuli as well as painful stimuli.  She will answer some questions and when I shake her, she will become more alert and she tells me to stop but when I asked her what she took, she just will repeat "I do not feel well."  She does not explicitly tell me what she took or why she took it.  On evaluation, she does have some dried blood around her mouth and naris.  She has some generalized abdominal tenderness.  Plan for medical clearance labs, poison control consult.  Discussed with patient's mom.  Mom states that she was at home by herself and called the dad and stated that she had drank bleach.  Youngest daughter was at home and saw her vomit up blood.  Mom states that the only pills that she has at home is HCTZ but she does not think any of those are missing.  She also has some weight loss pills but states that she does not think patient had access to them.  No other over-the-counter pills that she thinks are at the house.  5:57 PM: Discussed with May (poison control RN).  She recommends that we keep patient n.p.o., keep her on the cardiac monitor, obtain EKG, electrolytes, Tylenol, aspirin, ethanol levels.  She reports with bleach, we concerned about possible GI burns and recommends a GI  consult for possible endoscopy within 24 hours of ingestion.  She reports that with HCTZ, we would be concerned about fluid, electrolyte imbalance.  She recommends giving IV fluids and repeat EKG in about 4 to 6 hours as well as repeating electrolytes in about 4 hours.  Also recommend supportive care.  Discussed with mom. She went back to the house to see if there were any pills that patient could have taken. She had phentermine (14 tablets) that mom found in patient's bedroom that were all gone. Additionally, mom reports she found a bag of her uncles prescriptions of Benzotropin 1 mg, Lamoctrigine 100 mg, Oxcarbazepine 300 mg.   6:59 PM: Attempted to call poison control.   Discussed patient with Dr. Rhetta Mura (GI). Recommends PPI drip as well as obtaining a CXR. Recommends admission to medical team with plans for endoscopy tomorrow. Patient should be kept NPO.  If patient declines and is having more severe abdominal pain, he recommends that she could get a CT on pelvis for further evaluation.  If chest x-ray is unremarkable patient is otherwise doing okay, we can hold off.  7:24 PM: Discussed with Raynelle Fanning [employer]). Updated her on mom's concerns about additional pills.  She reports that the biggest things that would be looking for is drowsiness, dry mouth, fever, dilated pupils.  She reports continue managing with supportive care measures.  I-STAT beta is negative.  Acetaminophen, ethanol, salicylate level normal.  CBC shows no leukocytosis or anemia.  CMP shows potassium of 3.4.  BUN/creatinine within  normal limits.  AST is 14, ALT is 13.  Discussed patient with Internal medicine team who accepts patient for admission.   Portions of this note were generated with Scientist, clinical (histocompatibility and immunogenetics). Dictation errors may occur despite best attempts at proofreading.   Final Clinical Impression(s) / ED Diagnoses Final diagnoses:  Suicidal ideation  Non-intractable vomiting with nausea, unspecified  vomiting type    Rx / DC Orders ED Discharge Orders    None       Rosana Hoes 10/23/20 2109    Cathren Laine, MD 10/23/20 2114

## 2020-10-23 NOTE — Hospital Course (Addendum)
Poison control [phone].

## 2020-10-23 NOTE — ED Notes (Signed)
Mother at bedside.

## 2020-10-23 NOTE — ED Triage Notes (Addendum)
Per [facility], pt coming from home, reporting SI attempt, pt drank 8oz bleach, and admitted to taking pills,  Pt vomiting blood, pt uncooperative, gcs15. 20g in left wrist. bp 148/88 hr 124, 100 o2 roomair, cbg 138

## 2020-10-23 NOTE — ED Notes (Signed)
Pts family updated, Lowella Dell summer [phone]

## 2020-10-23 NOTE — BH Assessment (Signed)
TTS consult received. Consult pending medical clearance.

## 2020-10-23 NOTE — H&P (Addendum)
Date: 10/23/2020               Patient Name:  Briana Lynch MRN: [id number]  DOB: April 21, 1996 Age / Sex: 25 y.o., female   PCP: Patient, No Pcp Per (Inactive)         Medical Service: Internal Medicine Teaching Service         Attending Physician: Dr. Mayford Knife, Dorene Ar, MD    First Contact: Dr. Claudette Laws Pager: [phone]  Second Contact: Dr. Ephriam Knuckles Pager: [phone]       After Hours (After 5p/  First Contact Pager: [phone]  weekends / holidays): Second Contact Pager: [phone]   Chief Complaint: suicide attempt  History of Present Illness:  Briana Lynch is 24yo cisfemale with PTSD, dissociative identity disorder, hx psychosis presenting to [facility] after drinking bleach in apparent suicide attempt. Attempted to discuss history with patient. Mentions throat and stomach pain, but does not answer most of our questions.  Rest of history obtained via chart review and patient's mother at bedside Briana Lynch).  Ms. Levada Schilling reports she received a call from her husband, who had been told by the patient that she drank bleach and wanted him to come home. Patient's younger sister was at home at the time and went up stairs to check on her. When Ms. Levada Schilling arrived she was throwing up blood and appearing ill, and EMS was called. Of note, Ms. Levada Schilling reports that patient could have taken her phentermine (approxaminately 14 tablets), as she found the bottle behind patient's bed. Also reports finding an empty bag of patient's uncle's medications (benzotropine, lamotrigine, oxcarbazepine) in the trash. Unclear how many pills were present prior or if patient took any of them.  Ms. Levada Schilling further reports that the patient has been upset since coming home from college a year and a half ago. Reportedly patient left home happy and energetic after high school, but came back very upset. Ms. Levada Schilling is not sure what has happened, thinks life wasn't what the patient expected. States patient was a good kid  growing up and is a good daughter. 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 Ms. Levada Schilling states patient had not previously been aggressive or had previous suicide attempts. She reports that she involuntarily committed the patient two years ago due to "not acting like herself." Reportedly given medications that made her "like a zombie." Since then patient has refused to take any medications, even over the counter medications. Denies any known hallucinations or manic-like episodes, although recalls some nights where patient was restless. Mentions patient does not tell her many things and is closer to their father.  Meds:  Patient currently not taking any medications regularly.  Allergies: Allergies as of 10/23/2020 - Review Complete 10/23/2020  Allergen Reaction Noted  . Penicillins Other (See Comments) 12/05/2012   Past Medical History:  Diagnosis Date  . Ankle sprain and strain 11/15/2010  . Anxiety state 12/09/2012  . Medical history non-contributory   . PCOS (polycystic ovarian syndrome)   . Right ankle sprain 11/15/2010   Family History:  Patient's paternal uncle has schizophrenia. 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 Per their mother, started smoking cigarettes sometime in college, roughly 1 ppd. Previously tried to vape instead but is back to smoking cigarettes. Reportedly smoked marijuana once in high school. Denies known history of alcohol use.  Review of  Systems: A complete ROS was negative except as per HPI.   Physical Exam: Blood pressure 124/89, pulse 79, temperature 99.4 F (37.4 C), temperature source Oral, resp. rate 15, height 5\' 7"  (1.702 m), weight 94 kg, SpO2 100 %. General: Laying in bed, no acute distress, non-diaphoretic, non-toxic appearing, in hospital scrubs. HENT: Normocephalic, atraumatic. Dry mucous membranes. No bleeding,  ulcers, lesions, foreign objects in oral cavity appreciated. Eyes: Pupils equal, round, reactive bilaterally. CV: Regular rate, rhythm. No murmurs, rubs, gallops appreciated. Distal pulses 2+ bilaterally. Pulm: Normal work of breathing. Clear to auscultation bilaterally. GI: Soft, non-distended. Mildly tender in LUQ. Decreased bowel sounds.  MSK: Normal bulk, tone. No edema bilateral LE.  Skin: Warm, dry. No scars or cuts appreciated.  Neuro: Somnolent. Awakens to name. Intermittently follows commands.  CBC Latest Ref Rng & Units 10/23/2020 01/03/2019 01/04/2016  WBC 4.0 - 10.5 K/uL 6.5 8.1 7.2  Hemoglobin 12.0 - 15.0 g/dL 01/06/2016 16.1 09.6  Hematocrit 36.0 - 46.0 % 38.2 44.6 38.6  Platelets 150 - 400 K/uL 293 276 286   BMP Latest Ref Rng & Units 10/23/2020 01/03/2019 01/04/2016  Glucose 70 - 99 mg/dL 01/06/2016) 409(W) 119(J)  BUN 6 - 20 mg/dL 6 7 13   Creatinine 0.44 - 1.00 mg/dL 47(W 2.95  Sodium 135 - 145 mmol/L 141 139 138  Potassium 3.5 - 5.1 mmol/L 3.4(L) 3.6 4.2  Chloride 98 - 111 mmol/L 111 102 103  CO2 22 - 32 mmol/L 22 26 26   Calcium 8.9 - 10.3 mg/dL 9.1 6.21 9.3   Salicylate <7 Acetaminophen <10 EtOH <10 Beta-hCG <5  EKG: personally reviewed my interpretation is sinus tachycardia  CXR: personally reviewed my interpretation is no effusions, pulmonary congestion, or opacities appreciated.  Assessment & Plan by Problem: 3.08 is 24yo cisfemale with PTSD, dissociative identity disorder, hx psychosis admitted 4/10 with suicide attempt via ingestion of bleach, possibly other medications.  Active Problems:   Suicide attempt [facility])  #Suicide attempt #Ingestion of bleach +/- other medications On arrival to ED, patient afebrile, tachycardic, normotensive, sating well on RA. Initial work-up with negative salicylate, acetaminophen, EtOH levels, electrolytes normal. Poison control was contacted via ED provider. It was recommended to keep patient NPO, administer IVF, monitor  patient with tele, follow electrolytes, and GI consult. GI consulted by ED, recommending PPI drip with plans for endoscopy tomorrow. Per GI, if worsening abdominal pain will obtain CT abd/pelvis. Once further history was obtained regarding possible ingestion of other medications (phentermine, benzotropine, lamotrigine, oxcarbazepine), poison control contacted again. At that time was instructed to watch for drowsiness, dry mouth, fever, dilated pupils. On our exam, patient appears hemodynamically stable, somnolent but easily arousable. Plan to continue IVF with PPI drip overnight prior to EGD tomorrow. Will repeat EKG and BMP overnight as well. Appreciate GI recommendations. Patient currently fully oriented, but does not cooperate with questioning and therefore difficult to assess current state of mind, unclear if having further SI/HI/hallucinations/delusions/paranoia. Patient will require sitter, high risk per C-SSRS.  - R/p EKG, BMP tonight - IVF: NS 125cc/hr - PPI drip - NPO, EGD in AM - F/u UA, UDS, BMP, CBC in AM - 1:1 sitter ordered - Tele - Appreciate poison control ([phone]) - CT abd/pelvis if abdominal pain worsens  #PTSD #Dissociative identity disorder #Hx psychosis Prior to moving back home from college, patient was high-functioning, reportedly being accepted into law school. Per chart review, patient admitted to [facility] 12/2018. At that time patient was experiencing suicidal and homicidal ideation, hallucinations,  paranoia.  Once patient was stable for discharge, administered long-acting injectable Risperdal d/t concerns over medication compliance. Per patient's mother, medication made them "like a zombie." Ms. Levada Schilling has concerns over this medication and would like to avoid if possible. Discussed we will need to stabilize medically and have psychiatry consult for further evaluation and recommendations. - Psychiatry consult  #Hypokalemia K 3.4 on arrival. Unclear source, possibly 2/2  ingestion vs diet. Will replenish intravenously, follow BMP overnight. - IV potassium repletion - F/u BMP overnight, AM  Dispo: Admit patient to Inpatient with expected length of stay greater than 2 midnights.  Signed: Evlyn Kanner, MD 10/23/2020, 8:22 PM  Pager: [phone] After 5pm on weekdays and 1pm on weekends: On Call pager: [phone]

## 2020-10-24 ENCOUNTER — Encounter (HOSPITAL_COMMUNITY)
Admission: EM | Disposition: A | Discharge status: Home / Self Care | Payer: Self-pay | Source: Home / Self Care | Attending: Internal Medicine

## 2020-10-24 ENCOUNTER — Encounter (HOSPITAL_COMMUNITY): Payer: Self-pay | Admitting: Internal Medicine

## 2020-10-24 ENCOUNTER — Inpatient Hospital Stay (HOSPITAL_COMMUNITY): Payer: Self-pay | Admitting: Anesthesiology

## 2020-10-24 DIAGNOSIS — K2960 Other gastritis without bleeding: Secondary | ICD-10-CM

## 2020-10-24 DIAGNOSIS — R112 Nausea with vomiting, unspecified: Secondary | ICD-10-CM

## 2020-10-24 DIAGNOSIS — K922 Gastrointestinal hemorrhage, unspecified: Secondary | ICD-10-CM

## 2020-10-24 DIAGNOSIS — F332 Major depressive disorder, recurrent severe without psychotic features: Secondary | ICD-10-CM

## 2020-10-24 DIAGNOSIS — T5491XA Toxic effect of unspecified corrosive substance, accidental (unintentional), initial encounter: Secondary | ICD-10-CM

## 2020-10-24 DIAGNOSIS — F4312 Post-traumatic stress disorder, chronic: Secondary | ICD-10-CM

## 2020-10-24 DIAGNOSIS — T1491XA Suicide attempt, initial encounter: Secondary | ICD-10-CM

## 2020-10-24 DIAGNOSIS — K3189 Other diseases of stomach and duodenum: Secondary | ICD-10-CM

## 2020-10-24 DIAGNOSIS — K2900 Acute gastritis without bleeding: Secondary | ICD-10-CM

## 2020-10-24 HISTORY — PX: ESOPHAGOGASTRODUODENOSCOPY (EGD) WITH PROPOFOL: SHX5813

## 2020-10-24 LAB — BASIC METABOLIC PANEL
Anion gap: 8 (ref 5–15)
BUN: 7 mg/dL (ref 6–20)
CO2: 19 mmol/L — ABNORMAL LOW (ref 22–32)
Calcium: 8.2 mg/dL — ABNORMAL LOW (ref 8.9–10.3)
Chloride: 111 mmol/L (ref 98–111)
Creatinine, Ser: 0.75 mg/dL (ref 0.44–1.00)
GFR, Estimated: >60 mL/min (ref >60)
Glucose, Bld: 90 mg/dL (ref 70–99)
Potassium: 4.2 mmol/L (ref 3.5–5.1)
Sodium: 138 mmol/L (ref 135–145)

## 2020-10-24 LAB — CBC
HCT: 33.3 % — ABNORMAL LOW (ref 36.0–46.0)
Hemoglobin: 11 g/dL — ABNORMAL LOW (ref 12.0–15.0)
MCH: 29.3 pg (ref 26.0–34.0)
MCHC: 33 g/dL (ref 30.0–36.0)
MCV: 88.8 fL (ref 80.0–100.0)
Platelets: 249 10*3/uL (ref 150–400)
RBC: 3.75 MIL/uL — ABNORMAL LOW (ref 3.87–5.11)
RDW: 14 % (ref 11.5–15.5)
WBC: 11.8 10*3/uL — ABNORMAL HIGH (ref 4.0–10.5)
nRBC: 0 % (ref 0.0–0.2)

## 2020-10-24 LAB — MAGNESIUM: Magnesium: 2 mg/dL (ref 1.7–2.4)

## 2020-10-24 LAB — POCT PREGNANCY, URINE: Preg Test, Ur: NEGATIVE

## 2020-10-24 LAB — HIV ANTIBODY (ROUTINE TESTING W REFLEX): HIV Screen 4th Generation wRfx: NONREACTIVE

## 2020-10-24 SURGERY — ESOPHAGOGASTRODUODENOSCOPY (EGD) WITH PROPOFOL
Anesthesia: Monitor Anesthesia Care

## 2020-10-24 MED ORDER — PROPOFOL 10 MG/ML IV BOLUS
INTRAVENOUS | Status: DC | PRN
  Administered 2020-10-24: 30 mg via INTRAVENOUS
  Administered 2020-10-24: 40 mg via INTRAVENOUS

## 2020-10-24 MED ORDER — PANTOPRAZOLE SODIUM 40 MG PO TBEC
40.0000 mg | DELAYED_RELEASE_TABLET | Freq: Two times a day (BID) | ORAL | Status: DC
  Administered 2020-10-24 – 2020-10-26 (×4): 40 mg via ORAL
  Filled 2020-10-24 (×4): qty 1

## 2020-10-24 MED ORDER — PROPOFOL 500 MG/50ML IV EMUL
INTRAVENOUS | Status: DC | PRN
  Administered 2020-10-24: 100 ug/kg/min via INTRAVENOUS

## 2020-10-24 MED ORDER — LACTATED RINGERS IV SOLN: INTRAVENOUS | Status: DC | PRN

## 2020-10-24 MED ORDER — POTASSIUM CHLORIDE 10 MEQ/100ML IV SOLN
10.0000 meq | INTRAVENOUS | Status: AC
  Administered 2020-10-24 (×2): 10 meq via INTRAVENOUS
  Filled 2020-10-24: qty 100

## 2020-10-24 SURGICAL SUPPLY — 15 items

## 2020-10-24 NOTE — Op Note (Signed)
[facility] Patient Name: Briana Lynch Procedure Date : 10/24/2020 MRN: [id number] Attending MD: Beverley Fiedler , MD Date of Birth: [birth year] CSN: [id number] Age: 25 Admit Type: Inpatient Procedure:                Upper GI endoscopy Indications:              Endoscopy to assess acute esophageal, gastric and                            duodenal injury after caustic ingestion (bleach                            ingestion yesterday) Providers:                Carie Caddy. Rhea Belton, MD, Fayrene Fearing, RN, Arlee Muslim                            Tech., Technician, Romie Minus, CRNA Referring MD:             [facility] Medicines:                Monitored Anesthesia Care Complications:            No immediate complications. Estimated Blood Loss:     Estimated blood loss: none. Procedure:                Pre-Anesthesia Assessment:                           - Prior to the procedure, a History and Physical                            was performed, and patient medications and                            allergies were reviewed. The patient's tolerance of                            previous anesthesia was also reviewed. The risks                            and benefits of the procedure and the sedation                            options and risks were discussed with the patient.                            All questions were answered, and informed consent                            was obtained. Prior Anticoagulants: The patient has                            taken no previous anticoagulant or antiplatelet  agents. ASA Grade Assessment: II - A patient with                            mild systemic disease. After reviewing the risks                            and benefits, the patient was deemed in                            satisfactory condition to undergo the procedure.                           After obtaining informed consent, the endoscope was                             passed under direct vision. Throughout the                            procedure, the patient's blood pressure, pulse, and                            oxygen saturations were monitored continuously. The                            [id number]-[id number] ([id number] Olympus gastroscope was                            introduced through the mouth, and advanced to the                            second part of duodenum. The upper GI endoscopy was                            accomplished without difficulty. The patient                            tolerated the procedure well. Scope In: Scope Out: Findings:      The examined esophagus was normal. No evidence of esophageal mucosal       injury or inflammation.      Diffuse moderate mucosal changes characterized by congestion, pale       discoloration and hemorrhagic appearance were found in the entire       examined stomach. Pale hemorrhagic mucosal appearance is more pronounced       in the proximal stomach and mild erythema in the antrum. No ulceration       seen.      Patchy mildly erythematous mucosa without active bleeding was found in       the duodenal bulb and very proximal 2nd portion of the duodenum. The       examined duodenum distal to the ampulla was normal. Impression:               - Normal esophagus. Zargar classification = 0.                           -  Congested, discolored and hemorrhagic appearing                            mucosa in the stomach due to caustic injury. Mild                            to moderate without ulceration or frank necrosis.                           - Mild proximal duodenal erythema without                            ulceration or necrosis.                           - No specimens collected. Moderate Sedation:      N/A Recommendation:           - Return patient to hospital ward for ongoing care.                           - Okay for clear liquids x 24 hours and if                            tolerated  advance diet as tolerated.                           - Continue present medications. Would recommend PPI                            BID x 2-4 weeks given gastric mucosal injury.                           - GI will sign off, call if questions. Procedure Code(s):        --- Professional ---                           [phone], Esophagogastroduodenoscopy, flexible,                            transoral; diagnostic, including collection of                            specimen(s) by brushing or washing, when performed                            (separate procedure) Diagnosis Code(s):        --- Professional ---                           K31.89, Other diseases of stomach and duodenum                           K92.2, Gastrointestinal hemorrhage, unspecified  T54.94XA, Toxic effect of unspecified corrosive                            substance, undetermined, initial encounter CPT copyright 2019 [employer]. All rights reserved. The codes documented in this report are preliminary and upon coder review may  be revised to meet current compliance requirements. Beverley Fiedler, MD 10/24/2020 10:46:21 AM This report has been signed electronically. Number of Addenda: 0

## 2020-10-24 NOTE — Plan of Care (Signed)
Pt's Mom met with this provider to discuss plan of care for [facility] admission. Provider explained mom that we recommend her to go to [facility] inpatient unit. Mom agrees with the plan. Mom wants that Pt should not receive Haldol and Risperidone at [facility]  as it makes her feel like a zombie. Mom states Pt sometimes does not communicate and she wants to make sure its in her chart. Denies any concerns.    Dr. Armando Reichert MD PGY1 [facility] Psychiatry.

## 2020-10-24 NOTE — Anesthesia Preprocedure Evaluation (Addendum)
Anesthesia Evaluation  Patient identified by MRN, date of birth, ID band Patient awake    Reviewed: Allergy & Precautions, NPO status , Patient's Chart, lab work & pertinent test results  Airway Mallampati: III  TM Distance: >3 FB Neck ROM: Full    Dental  (+) Teeth Intact, Dental Advisory Given   Pulmonary Current Smoker and Patient abstained from smoking.,    breath sounds clear to auscultation       Cardiovascular negative cardio ROS   Rhythm:Regular Rate:Normal     Neuro/Psych PSYCHIATRIC DISORDERS Anxiety Schizophrenia    GI/Hepatic Neg liver ROS,   Endo/Other    Renal/GU negative Renal ROS     Musculoskeletal   Abdominal Normal abdominal exam  (+)   Peds  Hematology negative hematology ROS (+)   Anesthesia Other Findings   Reproductive/Obstetrics                            Anesthesia Physical Anesthesia Plan  ASA: II  Anesthesia Plan: MAC   Post-op Pain Management:    Induction: Intravenous  PONV Risk Score and Plan: 0 and Propofol infusion  Airway Management Planned: Natural Airway and Simple Face Mask  Additional Equipment: None  Intra-op Plan:   Post-operative Plan:   Informed Consent: I have reviewed the patients History and Physical, chart, labs and discussed the procedure including the risks, benefits and alternatives for the proposed anesthesia with the patient or authorized representative who has indicated his/her understanding and acceptance.       Plan Discussed with: CRNA  Anesthesia Plan Comments:         Anesthesia Quick Evaluation

## 2020-10-24 NOTE — Consult Note (Signed)
[facility] Consult: 8:15 AM 10/24/2020  LOS: 1 day    Referring Provider: Dr Charissa Bash, MD Primary Care Physician:  Patient, No Pcp Per (Inactive) Primary Gastroenterologist:  Gentry Fitz.   Mother Alethia Berthold [phone]    Reason for Consultation: Intentional chlorine bleach ingestion.  Hematemesis   HPI: YARIMA PENMAN is a 25 y.o. female.  PMH psychosis, anxiety, dissociative disorder.  PTSD.  IDA.Marland Kitchen  Polycystic ovarian syndrome.  Patient intentionally vomited 8 ounce bleach and took pills in suicide attempt.  Ingested medications not to find but could include up to 14 phentermine tablets as well as uncles medications including benztropine, lamotrigine, oxcarbazepine.  Per EMS vomiting blood when they arrived at the home. Pt says vomiting initially clear, then 2 episodes of Hematemesis, no further hematemesis overnight or this morning.  Vomiting started within less than a minute of ingestion.  Total, volume of emesis greater than what she had swallowed.  Currently having pain at the top of her esophagus/back of her throat when she swallows.  It is not severe.  Pt thinks she had a bowel movement yesterday but is not sure.  It definitely was not bloody or melenic. Previous GI issues. Home meds listed below.  However reports are that she does not take many if any meds.  No gastrointestinal meds.  BP stable, currently 105/64.  Heart rate initially 111 now in the 60s.  Room air sats high 90s to 100%. Initiated on Protonix drip. Hgb 12.5 >> 11.  MCV 88.  WBCs 11.8.  Platelets normal. Potassium initially 3.4, now 4.2.  CO2 19. LFTs normal. Salicylate and APAP levels not elevated. COVID-19 negative. 1V CXR: No acute cardiopulmonary disease but suboptimal inspiration.  Patient was in college but has  not attended for a year and a half.  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    Past Medical History:  Diagnosis Date  . Ankle sprain and strain 11/15/2010  . Anxiety state 12/09/2012  . Medical history non-contributory   . PCOS (polycystic ovarian syndrome)   . Right ankle sprain 11/15/2010    History reviewed. No pertinent surgical history.  Prior to Admission medications   Medication Sig Start Date End Date Taking? Authorizing Provider  benztropine (COGENTIN) 1 MG tablet Take 1 tablet (1 mg total) by mouth 2 (two) times daily. Patient not taking: Reported on 10/23/2020 01/09/19   Malvin Johns, MD  metFORMIN (GLUCOPHAGE-XR) 500 MG 24 hr tablet TAKE 3 TABLETS EVERY DAY Patient not taking: Reported on 10/23/2020 03/16/16   Georges Mouse, NP  omega-3 acid ethyl esters (LOVAZA) 1 g capsule Take 1 capsule (1 g total) by mouth 2 (two) times daily. Patient not taking: Reported on 10/23/2020 01/09/19   Malvin Johns, MD  Prenatal Vit-Fe Fumarate-FA (PRENATAL MULTIVITAMIN) TABS tablet Take 1 tablet by mouth daily. Patient not taking: Reported on 10/23/2020 01/09/19   Malvin Johns, MD  risperiDONE (RISPERDAL) 2 MG tablet 1 in am 2 at hs x 7 days  Then 2 at hs only Patient not taking: Reported on 10/23/2020 01/09/19  Malvin Johns, MD  risperiDONE microspheres (RISPERDAL CONSTA) 50 MG injection Inject 2 mLs (50 mg total) into the muscle every 14 (fourteen) days. May take next dose in 21 days - 7/17 Patient not taking: Reported on 10/23/2020 01/22/19   Malvin Johns, MD  traZODone (DESYREL) 150 MG tablet Take 1 tablet (150 mg total) by mouth at bedtime as needed for sleep. Patient not taking: Reported on 10/23/2020 01/09/19   Malvin Johns, MD    Scheduled Meds: . Melene Muller ON 10/27/2020] pantoprazole  40 mg Intravenous Q12H   Infusions: . sodium chloride 125 mL/hr at 10/24/20 0618  . pantoprozole (PROTONIX) infusion 8 mg/hr (10/24/20 0401)   PRN Meds:    Allergies as of 10/23/2020 - Review Complete 10/23/2020  Allergen  Reaction Noted  . Penicillins Other (See Comments) 12/05/2012    Family History  Problem Relation Age of Onset  . Diabetes Mother   . Hypertension Mother   . Mental illness Sister     Social History   Socioeconomic History  . Marital status: Single    Spouse name: Not on file  . Number of children: Not on file  . Years of education: Not on file  . Highest education level: 12th grade  Occupational History  . Not on file  Tobacco Use  . Smoking status: Current Some Day Smoker    Packs/day: 1.00    Types: Cigarettes  . Smokeless tobacco: Never Used  . Tobacco comment: Dad smokes  Vaping Use  . Vaping Use: Never used  Substance and Sexual Activity  . Alcohol use: Not Currently    Alcohol/week: 0.0 standard drinks  . Drug use: Yes    Types: Marijuana    Comment: infrequent  . Sexual activity: Not Currently    Birth control/protection: Injection  Other Topics Concern  . Not on file  Social History Narrative   2014:Sophomore in high school, taking AP classes, enjoys school manages basketball, football and track teams   Social Determinants of Health   Financial Resource Strain: Not on file  Food Insecurity: Not on file  Transportation Needs: Not on file  Physical Activity: Not on file  Stress: Not on file  Social Connections: Not on file  Intimate Partner Violence: Not on file    REVIEW OF SYSTEMS: Constitutional: No weakness, no fatigue ENT:  No nose bleeds Pulm: No cough or difficulty breathing CV:  No palpitations, no LE edema.  No angina. GU:  No hematuria, no frequency GI: See HPI Heme: Other than the episodes of hematemesis, no unusual or excessive bleeding or bruising Transfusions: None Neuro:  No headaches, no peripheral tingling or numbness.  No syncope, no seizures Derm:  No itching, no rash or sores.  Endocrine:  No sweats or chills.  No polyuria or dysuria Immunization: Reviewed vaccination history.  It does not include COVID-19 information, did  not query patient about COVID-19 vaccination. Travel:  None beyond local counties in last few months.    PHYSICAL EXAM: Vital signs in last 24 hours: Vitals:   10/24/20 0406 10/24/20 0736  BP: 111/73 105/64  Pulse: 64   Resp: 19 18  Temp: 97.7 F (36.5 C) 98.4 F (36.9 C)  SpO2: 100% 100%   Wt Readings from Last 3 Encounters:  10/24/20 (P) 100.6 kg  01/03/19 86.2 kg  12/19/18 103 kg    General: Overweight, calm, alert, nonill appearing Head: No facial asymmetry or swelling.  No signs of head trauma. Eyes: No scleral icterus, no conjunctival pallor.  EOMI Ears: Not hard of hearing Nose: No congestion, no discharge Mouth: Moist, clear, pink mucosa.  Tongue midline. Neck: No JVD, no masses, no thyromegaly Lungs: Clear bilaterally.  No labored breathing or cough. Heart: RRR.  No MRG.  S1, S2 present. Abdomen: Soft.  Not tender, not distended.  No HSM, masses, bruits, hernias..   Rectal: Deferred. Musc/Skeltl: No joint redness, swelling or gross deformity. Extremities: No CCE. Neurologic: Oriented x3.  Moves all 4 limbs.  No tremors/involuntary movement.  No gross deficits. Skin: Rash, no sores, no suspicious lesions. Nodes: No cervical adenopathy Psych: Pleasant, cooperative, calm.  Intake/Output from previous day: 04/10 0701 - 04/11 0700 In: 1100 [IV Piggyback:1100] Out: -  Intake/Output this shift: No intake/output data recorded.  LAB RESULTS: Recent Labs    10/23/20 1803 10/24/20 0157  WBC 6.5 11.8*  HGB 12.5 11.0*  HCT 38.2 33.3*  PLT 293 249   BMET Lab Results  Component Value Date   NA 138 10/24/2020   NA 140 10/23/2020   NA 141 10/23/2020   K 4.2 10/24/2020   K 3.7 10/23/2020   K 3.4 (L) 10/23/2020   CL 111 10/24/2020   CL 110 10/23/2020   CL 111 10/23/2020   CO2 19 (L) 10/24/2020   CO2 23 10/23/2020   CO2 22 10/23/2020   GLUCOSE 90 10/24/2020   GLUCOSE 102 (H) 10/23/2020   GLUCOSE 125 (H) 10/23/2020   BUN 7 10/24/2020   BUN 6 10/23/2020    BUN 6 10/23/2020   CREATININE 0.75 10/24/2020   CREATININE 0.77 10/23/2020   CREATININE 0.83 10/23/2020   CALCIUM 8.2 (L) 10/24/2020   CALCIUM 8.6 (L) 10/23/2020   CALCIUM 9.1 10/23/2020   LFT Recent Labs    10/23/20 1803  PROT 6.9  ALBUMIN 3.9  AST 14*  ALT 13  ALKPHOS 72  BILITOT 0.6   PT/INR No results found for: INR, PROTIME Hepatitis Panel No results for input(s): HEPBSAG, HCVAB, HEPAIGM, HEPBIGM in the last 72 hours. C-Diff No components found for: CDIFF Lipase  No results found for: LIPASE  Drugs of Abuse     Component Value Date/Time   LABOPIA NONE DETECTED 01/03/2019 1947   COCAINSCRNUR NONE DETECTED 01/03/2019 1947   LABBENZ NONE DETECTED 01/03/2019 1947   AMPHETMU NONE DETECTED 01/03/2019 1947   THCU POSITIVE (A) 01/03/2019 1947   LABBARB NONE DETECTED 01/03/2019 1947     RADIOLOGY STUDIES: DG Chest Portable 1 View  Result Date: 10/23/2020 CLINICAL DATA:  25 year old who drank approximately 8 ounces of bleach and ingested unspecified medications earlier today. Hematemesis. EXAM: PORTABLE CHEST 1 VIEW COMPARISON:  None. FINDINGS: Suboptimal inspiration accounts for crowded bronchovascular markings, especially in the bases, and accentuates the cardiac silhouette. Taking this into account, cardiomediastinal silhouette unremarkable. Lungs clear. Bronchovascular markings normal. Pulmonary vascularity normal. No visible pleural effusions. No pneumothorax. IMPRESSION: Suboptimal inspiration. No acute cardiopulmonary disease. Electronically Signed   By: Hulan Saas M.D.   On: 10/23/2020 19:20      IMPRESSION:   *   Intentional chlorine bleach ingestion and likely medication ingestion Resulting in hematemesis. Rule out esophagitis, gastritis from bleach.  Rule out Mallory-Weiss tear. Protonix drip in place  *    Mental health disorders.    PLAN:     *   EGD today   Jennye Moccasin  10/24/2020, 8:15 AM Phone [phone]
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
## 2020-10-24 NOTE — Progress Notes (Signed)
See EGD report for details  Patient is awake and alert after procedure.  I discussed the findings with her as well as the treatment recommendations.  I asked if it was okay to communicate with her mother the findings after endoscopy.  The patient said yes.  The patient also informed me it was okay for her mother to be given information regarding her health care including details of this hospitalization.  After receiving verbal permission from the patient I contacted the patient's mother by phone and updated her on the treatment plan.  The patient's mother wishes to visit and I asked that she wait about 1 hour, contact the patient's floor nurse to determine if and when this might be possible  Time provided for questions and answers the patient and her mother thanked me for our involvement  We will sign off but call if questions

## 2020-10-24 NOTE — Progress Notes (Signed)
Poison Control called to check status of patient. Update given. RN will continue to monitor patient.

## 2020-10-24 NOTE — Transfer of Care (Signed)
Immediate Anesthesia Transfer of Care Note  Patient: Briana Lynch  Procedure(s) Performed: ESOPHAGOGASTRODUODENOSCOPY (EGD) WITH PROPOFOL (N/A )  Patient Location: Endoscopy Unit  Anesthesia Type:MAC  Level of Consciousness: drowsy and patient cooperative  Airway & Oxygen Therapy: Patient Spontanous Breathing and Patient connected to nasal cannula oxygen  Post-op Assessment: Report given to RN and Post -op Vital signs reviewed and stable  Post vital signs: Reviewed  Last Vitals:  Vitals Value Taken Time  BP 115/60 10/24/20 1031  Temp    Pulse 62 10/24/20 1032  Resp 17 10/24/20 1032  SpO2 100 % 10/24/20 1032  Vitals shown include unvalidated device data.  Last Pain:  Vitals:   10/24/20 0857  TempSrc: Temporal  PainSc: 0-No pain         Complications: No complications documented.

## 2020-10-24 NOTE — Anesthesia Procedure Notes (Signed)
Procedure Name: MAC Date/Time: 10/24/2020 10:05 AM Performed by: Jenne Campus, CRNA Pre-anesthesia Checklist: Patient identified, Emergency Drugs available, Suction available, Patient being monitored and Timeout performed Oxygen Delivery Method: Nasal cannula

## 2020-10-24 NOTE — Progress Notes (Signed)
HD#1 Subjective:  Overnight Events: Patient admitted to hospital    Patient reports she is having a sore throat and has been spitting up. She states spit up is non-bloody, has bubbles and tastes bad. She sates it is not emesis. She denies any abdominal pain, CP, ShOB, confusion, change in vision, lightheadedness, fevers or chills.   She states her mood feels "fine". She states she drank bleach from a small bottle, about the size of a water bottle, and drank approximately three inches of the fluid. She states she took pills but does not elaborate on how many or which pills she took. When asked, she does not elaborate on how she was feeling prior to drinking the bleach. She denies feeling sad today. She states she does not want her mother updated about her care at this time.   Update: Nurse for patient called and states patient gave consent for her mother to come up to the room and that her mother can be updated to her medical condition.   Objective:  Vital signs in last 24 hours: Vitals:   10/24/20 0736 10/24/20 0857 10/24/20 1031 10/24/20 1041  BP: 105/64 123/71 115/60 120/64  Pulse:  62 64 (!) 52  Resp: 18 (!) 21 17 13   Temp: 98.4 F (36.9 C) 97.9 F (36.6 C) (!) 97.5 F (36.4 C)   TempSrc: Oral Temporal Temporal   SpO2: 100% 99% 100% 100%  Weight: (P) 100.6 kg 94 kg    Height:  5\' 8"  (1.727 m)     Supplemental O2: Room Air SpO2 100%  Physical Exam:  Constitutional: well-appearing person sitting in bed, in no acute distress HENT: normocephalic atraumatic, mucous membranes moist, no obvious lesions in mouth or throat. Eyes: conjunctiva non-erythematous Cardiovascular: regular rate and rhythm, no m/r/g Pulmonary/Chest: normal work of breathing on room air, lungs clear to auscultation bilaterally Abdominal: soft, mild tenderness on palpation of URQ and ULQ, non-distended MSK: normal bulk and tone Neurological: alert & oriented x 3 Skin: warm and dry Psych: Flat affect    Filed Weights   10/23/20 1746 10/24/20 0736 10/24/20 0857  Weight: 94 kg (P) 100.6 kg 94 kg     Intake/Output Summary (Last 24 hours) at 10/24/2020 1106 Last data filed at 10/24/2020 1024 Gross per 24 hour  Intake 1350 ml  Output --  Net 1350 ml   Net IO Since Admission: 1,350 mL [10/24/20 1106]  Pertinent Labs: CBC Latest Ref Rng & Units 10/24/2020 10/23/2020 01/03/2019  WBC 4.0 - 10.5 K/uL 11.8(H) 6.5 8.1  Hemoglobin 12.0 - 15.0 g/dL 11.0(L) 12.5 14.2  Hematocrit 36.0 - 46.0 % 33.3(L) 38.2 44.6  Platelets 150 - 400 K/uL 249 293 276    CMP Latest Ref Rng & Units 10/24/2020 10/23/2020 10/23/2020  Glucose 70 - 99 mg/dL 90 12/23/2020) 12/23/2020)  BUN 6 - 20 mg/dL 7 6 6   Creatinine 0.44 - 1.00 mg/dL 585(I 778(E  Sodium 135 - 145 mmol/L 138 140 141  Potassium 3.5 - 5.1 mmol/L 4.2 3.7 3.4(L)  Chloride 98 - 111 mmol/L 111 110 111  CO2 22 - 32 mmol/L 19(L) 23 22  Calcium 8.9 - 10.3 mg/dL 8.2(L) 8.6(L) 9.1  Total Protein 6.5 - 8.1 g/dL - - 6.9  Total Bilirubin 0.3 - 1.2 mg/dL - - 0.6  Alkaline Phos 38 - 126 U/L - - 72  AST 15 - 41 U/L - - 14(L)  ALT 0 - 44 U/L - - 13    Imaging: DG  Chest Portable 1 View  Result Date: 10/23/2020 CLINICAL DATA:  25 year old who drank approximately 8 ounces of bleach and ingested unspecified medications earlier today. Hematemesis. EXAM: PORTABLE CHEST 1 VIEW COMPARISON:  None. FINDINGS: Suboptimal inspiration accounts for crowded bronchovascular markings, especially in the bases, and accentuates the cardiac silhouette. Taking this into account, cardiomediastinal silhouette unremarkable. Lungs clear. Bronchovascular markings normal. Pulmonary vascularity normal. No visible pleural effusions. No pneumothorax. IMPRESSION: Suboptimal inspiration. No acute cardiopulmonary disease. Electronically Signed   By: Hulan Saas M.D.   On: 10/23/2020 19:20    Assessment/Plan:   Active Problems:   Suicide attempt (HCC)   Caustic injury gastritis   Ingestion of  caustic substance   Patient Summary: Briana Lynch is a 25 y.o. with a pertinent PMH of PTSD, dissociative disorder with psychosis s/p [facility] hospitalization (6/20-6/26/2020)  who presented with intentional ingestion of bleach +/- ingestion of pills and admitted for gastritis from caustic ingestion, suicide attempt.   Suicide attempt Ingestion of bleach +/- other medications Patients is well appearing this morning, only symptoms at this time are a sore throat and foul tasting spit up. Vitals remain within normal limits. WBC elevated to 11.8 from from 6.5 on admission, likely reactive. No major electrolyte abnormality on morning labs. Patient had EGD this morning significant for normal esophagus, "congested, discolored and hemorrhagic appearing mucosa in stomach due to caustic injury without ulceration or frank necrosis". Appreciate GI recommendations. Patient endorses taking pills but does not share how many or of what she took. Currently displays no indication of antimuscarinic toxicity. Will continue to monitor closey.  -Appreciate GI consult and recommendations -Clear fluids for next 24 hours and then diet as tolerated, as per GI -Protonix 40 mg BID for 2-4 weeks, as per GI -Continue 1:1 sitter, reordered for the next 12 hours -Follow up daily BMP, CBC  -Follow urinalysis, rapid drug screen   PTSD Dissociative disorder Hx of psychosis  Patient's mood appears stable on bedside rounds. She engaged in conversation, made eye contact but expressed little emotion given the situation. She did not share about thought process or mental state leading up to suicide attempt. Initially did not want medical information shared with mother, but ultimately changed her mind and consented to sharing medical information with mother, and permitted mother to come to room, as per nurse report and GI note. Spoke to psychiatry this morning and they will see patient once she returns from endoscopy.  -Appreciate  psychiatry consult and recommendations    Diet: Clear liquids  IVF: None,None VTE: None Code: Full PT/OT recs: None, none. TOC recs: Will need OP psychiatry follow-up Family Update: GI spoke with mother and updated her    Dispo: Anticipated discharge to [facility] in >1 days pending psychiatry reccomendations.   Elissa Hefty Medical Student  Pager [phone] Please contact the on call pager after 5 pm and on weekends at [phone].

## 2020-10-24 NOTE — Progress Notes (Signed)
Pt received from endo. VSS. Lunch tray ordered. Sitter present. Will continue to monitor.  Versie Starks, RN

## 2020-10-24 NOTE — Anesthesia Postprocedure Evaluation (Signed)
Anesthesia Post Note  Patient: Briana Lynch  Procedure(s) Performed: ESOPHAGOGASTRODUODENOSCOPY (EGD) WITH PROPOFOL (N/A )     Patient location during evaluation: PACU Anesthesia Type: MAC Level of consciousness: awake and alert Pain management: pain level controlled Vital Signs Assessment: post-procedure vital signs reviewed and stable Respiratory status: spontaneous breathing, nonlabored ventilation, respiratory function stable and patient connected to nasal cannula oxygen Cardiovascular status: stable and blood pressure returned to baseline Postop Assessment: no apparent nausea or vomiting Anesthetic complications: no   No complications documented.  Last Vitals:  Vitals:   10/24/20 1041 10/24/20 1109  BP: 120/64 103/67  Pulse: (!) 52 (!) 59  Resp: 13 16  Temp:  (!) 36.4 C  SpO2: 100% 100%    Last Pain:  Vitals:   10/24/20 1109  TempSrc: Oral  PainSc:                  [name]

## 2020-10-25 ENCOUNTER — Encounter (HOSPITAL_COMMUNITY): Payer: Self-pay | Admitting: Internal Medicine

## 2020-10-25 LAB — BASIC METABOLIC PANEL
Anion gap: 4 — ABNORMAL LOW (ref 5–15)
BUN: <5 mg/dL — ABNORMAL LOW (ref 6–20)
CO2: 25 mmol/L (ref 22–32)
Calcium: 8.5 mg/dL — ABNORMAL LOW (ref 8.9–10.3)
Chloride: 108 mmol/L (ref 98–111)
Creatinine, Ser: 0.78 mg/dL (ref 0.44–1.00)
GFR, Estimated: >60 mL/min (ref >60)
Glucose, Bld: 90 mg/dL (ref 70–99)
Potassium: 3.8 mmol/L (ref 3.5–5.1)
Sodium: 137 mmol/L (ref 135–145)

## 2020-10-25 LAB — CBC
HCT: 32.7 % — ABNORMAL LOW (ref 36.0–46.0)
Hemoglobin: 10.7 g/dL — ABNORMAL LOW (ref 12.0–15.0)
MCH: 29.3 pg (ref 26.0–34.0)
MCHC: 32.7 g/dL (ref 30.0–36.0)
MCV: 89.6 fL (ref 80.0–100.0)
Platelets: 264 10*3/uL (ref 150–400)
RBC: 3.65 MIL/uL — ABNORMAL LOW (ref 3.87–5.11)
RDW: 14 % (ref 11.5–15.5)
WBC: 6.8 10*3/uL (ref 4.0–10.5)
nRBC: 0 % (ref 0.0–0.2)

## 2020-10-25 LAB — SARS CORONAVIRUS 2 (TAT 6-24 HRS): SARS Coronavirus 2: NEGATIVE

## 2020-10-25 NOTE — BH Assessment (Addendum)
PATIENT CAN ARRIVE AFTER 8PM   PLEASE MAKE SURE THE PT IS DISCHARGED FROM [facility] BEFORE ARRIVING  Patient has been accepted to [facility].  Accepting physician is Dr. Neale Burly  Attending Physician will be Dr. Neale Burly Patient has been assigned to room 324, by [facility] Charge Nurse Megan.   Call report to [phone].  Representative/Transfer Coordinator is Ursula Dermody  Patient pre-admitted by [facility] Patient Access Aggie Cosier)  [facility] ER Staff Antony Blackbird, [place]) made aware of acceptance.

## 2020-10-25 NOTE — Social Work (Signed)
Pending covid test- Patient will d/c to [facility] Admitting MD - Les Pou  Room 324  After hrs CSW Vincente Poli is aware of potential d/c - updated RN Will need after hrs [employer] # [phone]  Antony Blackbird, MSW, LCSW Clinical Social Worker

## 2020-10-25 NOTE — Social Work (Signed)
CSW made referral to [facility](Melissa). They will review and informed CSW if they have availability.   CSW will continue to follow and assist with discharge planning.  Antony Blackbird, MSW, LCSW Clinical Social Worker

## 2020-10-25 NOTE — Social Work (Addendum)
CSW informed by [facility]- patient looks appropriate for admissions-covid was requested- CSW updated RN- requested rapid covid test.  Antony Blackbird, MSW, LCSW Clinical Social Worker

## 2020-10-25 NOTE — Social Work (Signed)
CSW consulted for inpatient psych placement. CSW met with the patient and her mother. CSW introduced self and explained role.CSW discussed disposition plan to d/c to [facility], voluntary verses involuntary commitment. Patient agreeable to voluntary admission and consent for treatment at [facility] and signed the Voluntary Consent form.   CSW informed patient and medical team , discharge is pending noted medical clearance from Psychiatry and Attending. Family and patient states understanding.    Thurmond Butts, MSW, LCSW Clinical Social Worker

## 2020-10-25 NOTE — Progress Notes (Signed)
Briana Lynch [phone]

## 2020-10-25 NOTE — Progress Notes (Signed)
  Covid test resulted negative. [place] MD, consulted. Patients discharge awaiting physician sign off.   Susy Frizzle, Consulting civil engineer at [employer] updated. Patient will likely arrive in the AM.

## 2020-10-25 NOTE — Consult Note (Signed)
[facility] Face-to-Face Psychiatry Consult   Reason for Consult:  Suicidal attempt , Follow up Referring Physician:  Hermine Messick Patient Identification: Briana Lynch MRN:  [id number] Principal Diagnosis: <principal problem not specified> Diagnosis:  Active Problems:   Chronic post-traumatic stress disorder (PTSD)   Suicide attempt (HCC)   Caustic injury gastritis   Ingestion of caustic substance   MDD (major depressive disorder), recurrent episode, severe (HCC)   Total Time spent with patient: 20 minutes  Subjective:  "I have to go to [facility], Right?" Pt is seen and examined today. Pt states her mood is good. Pt rates her mood at 10/10 (10 is the best mood). Pt states she slept well last night.  Pt states her appetite is good. Pt denies any anxiety. Pt states "I have to go to [facility], Right?. Currently, Pt denies any suicidal ideation, homicidal ideation and, visual and auditory hallucination. Pt reports chemical taste in mouth. Pt denies any headache, nausea, vomiting, dizziness, chest pain, SOB, abdominal pain, diarrhea, and constipation.  Pt denies any concerns.  On Examination, Pt is lying in the bed comfortably, She is still guarded and doesn't want to talk much. She nodes her head yes and no to questions and answer questions in few words without sharing much information. Pt's mood is euthymic and affect is constricted. Her speech is poor with normal volume. Denies SI, HI and AVH. Denies paranoia. Doesn't appear to be responding to internal stimuli.   Off note, Yesterday, Mom reported that Haldol and Risperidone makes her daughter feel like a Zombie so she should not be given Haldol or Risperidone at [facility].   HPI:  Briana Lynch is a 25 y.o. female patient with past history of PTSD, anxiety, DID, history of psychosis, PCOD admitted after intentional ingestion of chlorine bleach with pills.  Patient drank laundry bleach yesterday and took  tablets of unknown amount of pills which could include  phentermine, benztropine, lamotrigine and ox carbamazepine in an suicide attempt.   Past Psychiatric History: MDD, H/O psychosis, DID, Anxiety  Risk to Self:   Risk to Others:   Prior Inpatient Therapy:   Prior Outpatient Therapy:    Past Medical History:  Past Medical History:  Diagnosis Date  . Ankle sprain and strain 11/15/2010  . Anxiety state 12/09/2012  . PCOS (polycystic ovarian syndrome)     Past Surgical History:  Procedure Laterality Date  . ESOPHAGOGASTRODUODENOSCOPY (EGD) WITH PROPOFOL N/A 10/24/2020   Procedure: ESOPHAGOGASTRODUODENOSCOPY (EGD) WITH PROPOFOL;  Surgeon: Beverley Fiedler, MD;  Location: [facility] ENDOSCOPY;  Service: Gastroenterology;  Laterality: N/A;   Family History:  Family History  Problem Relation Age of Onset  . Diabetes Mother   . Hypertension Mother   . Mental illness Sister    Family Psychiatric  History: Per EMR records- Sister- Mental illness, paternal Uncle- Schizophrenia Social History:  Social History   Substance and Sexual Activity  Alcohol Use Not Currently  . Alcohol/week: 0.0 standard drinks     Social History   Substance and Sexual Activity  Drug Use Yes  . Types: Marijuana   Comment: infrequent    Social History   Socioeconomic History  . Marital status: Single    Spouse name: Not on file  . Number of children: Not on file  . Years of education: Not on file  . Highest education level: 12th grade  Occupational History  . Not on file  Tobacco Use  . Smoking status: Current Some Day Smoker    Packs/day: 1.00  Types: Cigarettes  . Smokeless tobacco: Never Used  . Tobacco comment: Dad smokes  Vaping Use  . Vaping Use: Never used  Substance and Sexual Activity  . Alcohol use: Not Currently    Alcohol/week: 0.0 standard drinks  . Drug use: Yes    Types: Marijuana    Comment: infrequent  . Sexual activity: Not Currently    Birth control/protection: Injection  Other Topics Concern  . Not on file  Social History  Narrative   2014:Sophomore in high school, taking AP classes, enjoys school manages basketball, football and track teams   Social Determinants of Health   Financial Resource Strain: Not on file  Food Insecurity: Not on file  Transportation Needs: Not on file  Physical Activity: Not on file  Stress: Not on file  Social Connections: Not on file   Additional Social History:    Allergies:   Allergies  Allergen Reactions  . Penicillins Other (See Comments)    Did it involve swelling of the face/tongue/throat, SOB, or low BP? No Did it involve sudden or severe rash/hives, skin peeling, or any reaction on the inside of your mouth or nose? No Did you need to seek medical attention at a hospital or doctor's office? No When did it last happen?Never If all above answers are "NO", may proceed with cephalosporin use.  Unknown-pt's sister is allergic    Labs:  Results for orders placed or performed during the hospital encounter of 10/23/20 (from the past 48 hour(s))  Comprehensive metabolic panel     Status: Abnormal   Collection Time: 10/23/20  6:03 PM  Result Value Ref Range   Sodium 141 135 - 145 mmol/L   Potassium 3.4 (L) 3.5 - 5.1 mmol/L   Chloride 111 98 - 111 mmol/L   CO2 22 22 - 32 mmol/L   Glucose, Bld 125 (H) 70 - 99 mg/dL    Comment: Glucose reference range applies only to samples taken after fasting for at least 8 hours.   BUN 6 6 - 20 mg/dL   Creatinine, Ser 1.61 0.44 - 1.00 mg/dL   Calcium 9.1 8.9 - 09.6 mg/dL   Total Protein 6.9 6.5 - 8.1 g/dL   Albumin 3.9 3.5 - 5.0 g/dL   AST 14 (L) 15 - 41 U/L   ALT 13 0 - 44 U/L   Alkaline Phosphatase 72 38 - 126 U/L   Total Bilirubin 0.6 0.3 - 1.2 mg/dL   GFR, Estimated >04 >54 mL/min    Comment: (NOTE) Calculated using the CKD-EPI Creatinine Equation (2021)    Anion gap 8 5 - 15    Comment: Performed at [facility], [address] [place], [place] [address]  CBC with Differential     Status: None    Collection Time: 10/23/20  6:03 PM  Result Value Ref Range   WBC 6.5 4.0 - 10.5 K/uL   RBC 4.25 3.87 - 5.11 MIL/uL   Hemoglobin 12.5 12.0 - 15.0 g/dL   HCT 91.4 78.2 - 95.6 %   MCV 89.9 80.0 - 100.0 fL   MCH 29.4 26.0 - 34.0 pg   MCHC 32.7 30.0 - 36.0 g/dL   RDW 21.3 08.6 - 57.8 %   Platelets 293 150 - 400 K/uL   nRBC 0.0 0.0 - 0.2 %   Neutrophils Relative % 58 %   Neutro Abs 3.9 1.7 - 7.7 K/uL   Lymphocytes Relative 32 %   Lymphs Abs 2.1 0.7 - 4.0 K/uL  Monocytes Relative 7 %   Monocytes Absolute 0.4 0.1 - 1.0 K/uL   Eosinophils Relative 1 %   Eosinophils Absolute 0.1 0.0 - 0.5 K/uL   Basophils Relative 1 %   Basophils Absolute 0.0 0.0 - 0.1 K/uL   Immature Granulocytes 1 %   Abs Immature Granulocytes 0.04 0.00 - 0.07 K/uL    Comment: Performed at [facility], [address] [place], [place] [address]  Salicylate level     Status: Abnormal   Collection Time: 10/23/20  6:03 PM  Result Value Ref Range   Salicylate Lvl <7.0 (L) 7.0 - 30.0 mg/dL    Comment: Performed at [facility], [address] [place], [place] [address]  Acetaminophen level     Status: Abnormal   Collection Time: 10/23/20  6:03 PM  Result Value Ref Range   Acetaminophen (Tylenol), Serum <10 (L) 10 - 30 ug/mL    Comment: (NOTE) Therapeutic concentrations vary significantly. A range of 10-30 ug/mL  may be an effective concentration for many patients. However, some  are best treated at concentrations outside of this range. Acetaminophen concentrations >150 ug/mL at 4 hours after ingestion  and >50 ug/mL at 12 hours after ingestion are often associated with  toxic reactions.  Performed at [facility], [address] [place], [place] [address]   Ethanol     Status: None   Collection Time: 10/23/20  6:03 PM  Result Value Ref Range   Alcohol, Ethyl (B) <10 <10 mg/dL    Comment: (NOTE) Lowest detectable limit for serum alcohol is 10 mg/dL.  For medical purposes only. Performed at  [facility], [address] [place], [place] [address]   I-Stat beta hCG blood, ED     Status: None   Collection Time: 10/23/20  6:08 PM  Result Value Ref Range   I-stat hCG, quantitative <5.0 <5 mIU/mL   Comment 3            Comment:   GEST. AGE      CONC.  (mIU/mL)   <=1 WEEK        5 - 50     2 WEEKS       50 - 500     3 WEEKS       100 - 10,000     4 WEEKS     1,000 - 30,000        FEMALE AND NON-PREGNANT FEMALE:     LESS THAN 5 mIU/mL   Resp Panel by RT-PCR (Flu A&B, Covid) Nasopharyngeal Swab     Status: None   Collection Time: 10/23/20  7:29 PM   Specimen: Nasopharyngeal Swab; Nasopharyngeal(NP) swabs in vial transport medium  Result Value Ref Range   SARS Coronavirus 2 by RT PCR NEGATIVE NEGATIVE    Comment: (NOTE) SARS-CoV-2 target nucleic acids are NOT DETECTED.  The SARS-CoV-2 RNA is generally detectable in upper respiratory specimens during the acute phase of infection. The lowest concentration of SARS-CoV-2 viral copies this assay can detect is 138 copies/mL. A negative result does not preclude SARS-Cov-2 infection and should not be used as the sole basis for treatment or other patient management decisions. A negative result may occur with  improper specimen collection/handling, submission of specimen other than nasopharyngeal swab, presence of viral mutation(s) within the areas targeted by this assay, and inadequate number of viral copies(<138 copies/mL). A negative result must be combined with clinical observations, patient history, and epidemiological information.  The expected result is Negative.  Fact Sheet for Patients:  BloggerCourse.com  Fact Sheet for Healthcare Providers:  SeriousBroker.it  This test is no t yet approved or cleared by the [place] FDA and  has been authorized for detection and/or diagnosis of SARS-CoV-2 by FDA under an Emergency Use Authorization (EUA). This EUA will remain   in effect (meaning this test can be used) for the duration of the COVID-19 declaration under Section 564(b)(1) of the Act, 21 U.S.C.section 360bbb-3(b)(1), unless the authorization is terminated  or revoked sooner.       Influenza A by PCR NEGATIVE NEGATIVE   Influenza B by PCR NEGATIVE NEGATIVE    Comment: (NOTE) The Xpert Xpress SARS-CoV-2/FLU/RSV plus assay is intended as an aid in the diagnosis of influenza from Nasopharyngeal swab specimens and should not be used as a sole basis for treatment. Nasal washings and aspirates are unacceptable for Xpert Xpress SARS-CoV-2/FLU/RSV testing.  Fact Sheet for Patients: BloggerCourse.com  Fact Sheet for Healthcare Providers: SeriousBroker.it  This test is not yet approved or cleared by the [place] FDA and has been authorized for detection and/or diagnosis of SARS-CoV-2 by FDA under an Emergency Use Authorization (EUA). This EUA will remain in effect (meaning this test can be used) for the duration of the COVID-19 declaration under Section 564(b)(1) of the Act, 21 U.S.C. section 360bbb-3(b)(1), unless the authorization is terminated or revoked.  Performed at [facility], [address], [place], [place] [address]   Basic metabolic panel     Status: Abnormal   Collection Time: 10/23/20 10:38 PM  Result Value Ref Range   Sodium 140 135 - 145 mmol/L   Potassium 3.7 3.5 - 5.1 mmol/L   Chloride 110 98 - 111 mmol/L   CO2 23 22 - 32 mmol/L   Glucose, Bld 102 (H) 70 - 99 mg/dL    Comment: Glucose reference range applies only to samples taken after fasting for at least 8 hours.   BUN 6 6 - 20 mg/dL   Creatinine, Ser 2.09 0.44 - 1.00 mg/dL   Calcium 8.6 (L) 8.9 - 10.3 mg/dL   GFR, Estimated >47 >09 mL/min    Comment: (NOTE) Calculated using the CKD-EPI Creatinine Equation (2021)    Anion gap 7 5 - 15    Comment: Performed at [facility], [address]  [place], [place] [address]  HIV Antibody (routine testing w rflx)     Status: None   Collection Time: 10/23/20 10:38 PM  Result Value Ref Range   HIV Screen 4th Generation wRfx Non Reactive Non Reactive    Comment: Performed at [facility], [address], [place], [place] [address]  Basic metabolic panel     Status: Abnormal   Collection Time: 10/24/20  1:57 AM  Result Value Ref Range   Sodium 138 135 - 145 mmol/L   Potassium 4.2 3.5 - 5.1 mmol/L    Comment: SLIGHT HEMOLYSIS   Chloride 111 98 - 111 mmol/L   CO2 19 (L) 22 - 32 mmol/L   Glucose, Bld 90 70 - 99 mg/dL    Comment: Glucose reference range applies only to samples taken after fasting for at least 8 hours.   BUN 7 6 - 20 mg/dL   Creatinine, Ser 6.54 0.44 - 1.00 mg/dL   Calcium 8.2 (L) 8.9 - 10.3 mg/dL   GFR, Estimated >65 >03 mL/min    Comment: (NOTE) Calculated using the CKD-EPI Creatinine Equation (2021)  Anion gap 8 5 - 15    Comment: Performed at [facility], [address], [place], [place] [address]  CBC     Status: Abnormal   Collection Time: 10/24/20  1:57 AM  Result Value Ref Range   WBC 11.8 (H) 4.0 - 10.5 K/uL   RBC 3.75 (L) 3.87 - 5.11 MIL/uL   Hemoglobin 11.0 (L) 12.0 - 15.0 g/dL   HCT 72.5 (L) 36.6 - 44.0 %   MCV 88.8 80.0 - 100.0 fL   MCH 29.3 26.0 - 34.0 pg   MCHC 33.0 30.0 - 36.0 g/dL   RDW 34.7 42.5 - 95.6 %   Platelets 249 150 - 400 K/uL   nRBC 0.0 0.0 - 0.2 %    Comment: Performed at [facility], [address], [place], [place] [address]  Magnesium     Status: None   Collection Time: 10/24/20  1:57 AM  Result Value Ref Range   Magnesium 2.0 1.7 - 2.4 mg/dL    Comment: Performed at [facility], [address], [place], [place] [address]  Pregnancy, urine POC     Status: None   Collection Time: 10/24/20  9:38 AM  Result Value Ref Range   Preg Test, Ur NEGATIVE NEGATIVE    Comment:        THE SENSITIVITY OF THIS METHODOLOGY IS >24 mIU/mL   Basic metabolic panel  Once     Status: Abnormal   Collection Time: 10/25/20  3:31 AM  Result Value Ref Range   Sodium 137 135 - 145 mmol/L   Potassium 3.8 3.5 - 5.1 mmol/L   Chloride 108 98 - 111 mmol/L   CO2 25 22 - 32 mmol/L   Glucose, Bld 90 70 - 99 mg/dL    Comment: Glucose reference range applies only to samples taken after fasting for at least 8 hours.   BUN <5 (L) 6 - 20 mg/dL   Creatinine, Ser 5.18 0.44 - 1.00 mg/dL   Calcium 8.5 (L) 8.9 - 10.3 mg/dL   GFR, Estimated >84 >16 mL/min    Comment: (NOTE) Calculated using the CKD-EPI Creatinine Equation (2021)    Anion gap 4 (L) 5 - 15    Comment: Performed at [facility], [address], [place], [place] [address]  CBC Once     Status: Abnormal   Collection Time: 10/25/20  3:31 AM  Result Value Ref Range   WBC 6.8 4.0 - 10.5 K/uL   RBC 3.65 (L) 3.87 - 5.11 MIL/uL   Hemoglobin 10.7 (L) 12.0 - 15.0 g/dL   HCT 16.0 (L) 10.9 - 32.3 %   MCV 89.6 80.0 - 100.0 fL   MCH 29.3 26.0 - 34.0 pg   MCHC 32.7 30.0 - 36.0 g/dL   RDW 55.7 32.2 - 02.5 %   Platelets 264 150 - 400 K/uL   nRBC 0.0 0.0 - 0.2 %    Comment: Performed at [facility], [address], [place], [place] [address]    Current Facility-Administered Medications  Medication Dose Route Frequency Provider Last Rate Last Admin  . pantoprazole (PROTONIX) EC tablet 40 mg  40 mg Oral BID AC Pyrtle, Carie Caddy, MD   40 mg at 10/25/20 2376    Musculoskeletal: Strength & Muscle Tone: within normal limits Gait & Station: Deferred Patient leans: N/A            Psychiatric Specialty Exam:  Presentation  General Appearance: Appropriate  for Environment  Eye Contact:Fair  Speech:Blocked (Talks in few words. Nodes head to yes or no.)  Speech Volume:Normal  Handedness:Right   Mood and Affect  Mood:Euthymic  Affect:Constricted   Thought Process  Thought Processes:Other (comment)  Descriptions of Associations:Intact  Orientation:Full (Time, Place and  Person)  Thought Content:Logical  History of Schizophrenia/Schizoaffective disorder:No data recorded Duration of Psychotic Symptoms:No data recorded Hallucinations:Hallucinations: None  Ideas of Reference:None  Suicidal Thoughts:Suicidal Thoughts: No  Homicidal Thoughts:Homicidal Thoughts: No   Sensorium  Memory:Immediate Fair; Recent Fair; Remote Fair  Judgment:Fair  Insight:Fair   Executive Functions  Concentration:Good  Attention Span:Fair  Recall:Fair  Fund of Knowledge:Fair  Language:Poor   Psychomotor Activity  Psychomotor Activity:Psychomotor Activity: Normal   Assets  Assets:Housing; Social Support; Resilience   Sleep  Sleep:Sleep: Good   Physical Exam: Physical Exam Vitals and nursing note reviewed.  Constitutional:      General: She is not in acute distress.    Appearance: Normal appearance. She is normal weight. She is not ill-appearing, toxic-appearing or diaphoretic.  HENT:     Head: Normocephalic and atraumatic.  Pulmonary:     Effort: Pulmonary effort is normal.  Neurological:     General: No focal deficit present.     Mental Status: She is alert and oriented to person, place, and time.    Review of Systems  Constitutional: Negative for chills and fever.  HENT: Negative for hearing loss and sore throat.   Eyes: Negative for blurred vision.  Respiratory: Negative for cough.   Cardiovascular: Negative for chest pain.  Gastrointestinal: Negative for abdominal pain, constipation and diarrhea.  Genitourinary: Negative for urgency.  Skin: Negative.   Neurological: Negative for dizziness, seizures and headaches.  Psychiatric/Behavioral: Positive for substance abuse. Negative for depression, hallucinations and suicidal ideas. The patient is not nervous/anxious and does not have insomnia.   Pt reports chemical taste in mouth.   Blood pressure 97/66, pulse 74, temperature 98.8 F (37.1 C), temperature source Oral, resp. rate 19, height  5\' 8"  (1.727 m), weight 94 kg, last menstrual period 10/17/2020, SpO2 100 %. Body mass index is 31.51 kg/m.  Treatment Plan Summary: Plan-  Pt continues to meet criteria for Inpatient Admission to [facility] after medical clearance.  Please contact CSW for placement at [facility] inpatient unit when Pt is medically cleared.  Pt is voluntary at this time, if Pt refuses to be admitted to [facility], IVC will be needed.  Disposition: Recommend psychiatric Inpatient admission when medically cleared.  [facility], MD 10/25/2020 2:13 PM

## 2020-10-25 NOTE — Progress Notes (Signed)
HD#2 Subjective:  Overnight Events: No acute overnight events reported   Patient states she is feeling better today. Sore throat in much improved and she is not having upset stomach, nausea or vomiting. She is tolerating oral intake, though complains that food tastes bad. She has no other complaints this morning.   Objective:  Vital signs in last 24 hours: Vitals:   10/24/20 2303 10/25/20 0453 10/25/20 0800 10/25/20 1200  BP: 95/60 98/60 98/60  97/66  Pulse: 63 63 73 74  Resp: 16 16 18 19   Temp: 98.1 F (36.7 C) 98.7 F (37.1 C) 98.7 F (37.1 C) 98.8 F (37.1 C)  TempSrc: Oral Oral Oral Oral  SpO2: 100% 100% 100% 100%  Weight:      Height:       Supplemental O2: Room Air SpO2: 100 % O2 Flow Rate (L/min): 2 L/min   Physical Exam:  Constitutional: well-appearing person sitting in bed, in no acute distress Cardiovascular: regular rate and rhythm, no m/r/g Pulmonary/Chest: Breathing comfortably on room air, no wheezing , crackles or rales Abdominal: soft, non-tender, non-distended  MSK: normal bulk and tone Neurological: alert & oriented x 3 Skin: warm and dry Psych: Appropriate   Filed Weights   10/23/20 1746 10/24/20 0736 10/24/20 0857  Weight: 94 kg (P) 100.6 kg 94 kg     Intake/Output Summary (Last 24 hours) at 10/25/2020 1301 Last data filed at 10/25/2020 1200 Gross per 24 hour  Intake 727.91 ml  Output 0 ml  Net 727.91 ml   Net IO Since Admission: 2,497.91 mL [10/25/20 1301]  Pertinent Labs: CBC Latest Ref Rng & Units 10/25/2020 10/24/2020 10/23/2020  WBC 4.0 - 10.5 K/uL 6.8 11.8(H) 6.5  Hemoglobin 12.0 - 15.0 g/dL 10.7(L) 11.0(L) 12.5  Hematocrit 36.0 - 46.0 % 32.7(L) 33.3(L) 38.2  Platelets 150 - 400 K/uL 264 249 293    CMP Latest Ref Rng & Units 10/25/2020 10/24/2020 10/23/2020  Glucose 70 - 99 mg/dL 90 90 12/24/2020)  BUN 6 - 20 mg/dL 12/23/2020) 7 6  Creatinine 818(E - 1.00 mg/dL <9(H 3.71 6.96  Sodium 135 - 145 mmol/L 137 138 140  Potassium 3.5 - 5.1 mmol/L  3.8 4.2 3.7  Chloride 98 - 111 mmol/L 108 111 110  CO2 22 - 32 mmol/L 25 19(L) 23  Calcium 8.9 - 10.3 mg/dL 7.89) 3.81) 0.1(B)  Total Protein 6.5 - 8.1 g/dL - - -  Total Bilirubin 0.3 - 1.2 mg/dL - - -  Alkaline Phos 38 - 126 U/L - - -  AST 15 - 41 U/L - - -  ALT 0 - 44 U/L - - -    Imaging: No results found.  Assessment/Plan:   Active Problems:   Chronic post-traumatic stress disorder (PTSD)   Suicide attempt (HCC)   Caustic injury gastritis   Ingestion of caustic substance   MDD (major depressive disorder), recurrent episode, severe (HCC)   Patient Summary: Briana Lynch is a 25 y.o. with a pertinent PMH of PTSD, dissociative disorder, history of psychosis who presented with intentional ingestion of bleach +/- pills and admitted for gastritis from caustic ingestion and sucicde attempt.   Suicide attempt  Ingestion of bleach +/- other medications Patient remains stable, nothing concerning with vitals or labs. Is tolerating PO well, her only complaint is bad taste with food. She will need to continue PPI as per GI but otherwise, she is medically stable for discharge to [facility] at this time.  -Protonix 40 mg BID for 2-4  weeks, as per GI -Continue 1:1 sitter -Regular diet as tolerated  PTSD Dissociative disorder  Hx of psychosis Patient's mood was neurtral at bedside rounds this morning. She stated she understood she was medically ok and acknowledged she was going to be transferred to [facility]. She seemed good with this plan. Mood was similar today as it was yesterday.  -Appreciate psychiatry   Diet: Normal IVF: None,None VTE: None Code: Full PT/OT recs: None, none. TOC recs: Transfer to [facility]   Dispo: Anticipated discharge to [facility] in 1 days pending bed availability.   Elissa Hefty Medical Student  Pager [phone]  Please contact the on call pager after 5 pm and on weekends at [phone].

## 2020-10-26 ENCOUNTER — Encounter: Payer: Self-pay | Admitting: Behavioral Health

## 2020-10-26 ENCOUNTER — Inpatient Hospital Stay
Admission: AD | Admit: 2020-10-26 | Discharge: 2020-10-28 | DRG: 885 | Disposition: A | Discharge status: Home / Self Care | Payer: 59 | Source: Intra-hospital | Attending: Behavioral Health | Admitting: Behavioral Health

## 2020-10-26 ENCOUNTER — Other Ambulatory Visit: Payer: Self-pay

## 2020-10-26 DIAGNOSIS — F411 Generalized anxiety disorder: Secondary | ICD-10-CM | POA: Diagnosis present

## 2020-10-26 DIAGNOSIS — K2960 Other gastritis without bleeding: Secondary | ICD-10-CM | POA: Diagnosis present

## 2020-10-26 DIAGNOSIS — Z20822 Contact with and (suspected) exposure to covid-19: Secondary | ICD-10-CM | POA: Diagnosis present

## 2020-10-26 DIAGNOSIS — F4312 Post-traumatic stress disorder, chronic: Secondary | ICD-10-CM | POA: Diagnosis present

## 2020-10-26 DIAGNOSIS — F333 Major depressive disorder, recurrent, severe with psychotic symptoms: Principal | ICD-10-CM | POA: Diagnosis present

## 2020-10-26 DIAGNOSIS — Z9151 Personal history of suicidal behavior: Secondary | ICD-10-CM

## 2020-10-26 DIAGNOSIS — Z716 Tobacco abuse counseling: Secondary | ICD-10-CM

## 2020-10-26 DIAGNOSIS — E282 Polycystic ovarian syndrome: Secondary | ICD-10-CM | POA: Diagnosis present

## 2020-10-26 DIAGNOSIS — G4700 Insomnia, unspecified: Secondary | ICD-10-CM | POA: Diagnosis present

## 2020-10-26 DIAGNOSIS — F17210 Nicotine dependence, cigarettes, uncomplicated: Secondary | ICD-10-CM | POA: Diagnosis present

## 2020-10-26 DIAGNOSIS — Z880 Allergy status to penicillin: Secondary | ICD-10-CM

## 2020-10-26 DIAGNOSIS — Z818 Family history of other mental and behavioral disorders: Secondary | ICD-10-CM | POA: Diagnosis not present

## 2020-10-26 MED ORDER — PANTOPRAZOLE SODIUM 40 MG PO TBEC
40.0000 mg | DELAYED_RELEASE_TABLET | Freq: Two times a day (BID) | ORAL | Status: DC
  Administered 2020-10-26 – 2020-10-28 (×4): 40 mg via ORAL
  Filled 2020-10-26 (×4): qty 1

## 2020-10-26 MED ORDER — MAGNESIUM HYDROXIDE 400 MG/5ML PO SUSP: 30.0000 mL | Freq: Every day | ORAL | Status: DC | PRN

## 2020-10-26 MED ORDER — METFORMIN HCL ER 500 MG PO TB24
500.0000 mg | ORAL_TABLET | Freq: Two times a day (BID) | ORAL | Status: DC
Start: 2020-10-26 — End: 2020-10-26
  Filled 2020-10-26 (×2): qty 1

## 2020-10-26 MED ORDER — ACETAMINOPHEN 325 MG PO TABS: 650.0000 mg | ORAL_TABLET | Freq: Four times a day (QID) | ORAL | Status: DC | PRN

## 2020-10-26 MED ORDER — PANTOPRAZOLE SODIUM 40 MG PO TBEC: 40.0000 mg | DELAYED_RELEASE_TABLET | Freq: Two times a day (BID) | ORAL | 0 refills | Status: DC

## 2020-10-26 MED ORDER — TRAZODONE HCL 100 MG PO TABS: 100.0000 mg | ORAL_TABLET | Freq: Every evening | ORAL | Status: DC | PRN

## 2020-10-26 MED ORDER — ALUM & MAG HYDROXIDE-SIMETH 200-200-20 MG/5ML PO SUSP: 30.0000 mL | ORAL | Status: DC | PRN

## 2020-10-26 MED ORDER — ARIPIPRAZOLE 5 MG PO TABS
5.0000 mg | ORAL_TABLET | Freq: Every day | ORAL | Status: DC
  Administered 2020-10-26 – 2020-10-27 (×2): 5 mg via ORAL
  Filled 2020-10-26 (×2): qty 1

## 2020-10-26 NOTE — Discharge Summary (Signed)
.   Name: Briana Lynch MRN: [id number] DOB: 04/30/1996 25 y.o. PCP: Patient, No Pcp Per (Inactive)  Date of Admission: 10/23/2020  5:38 PM Date of Discharge: 10/26/2020 Attending Physician: Dr. Mayford Knife  Discharge Diagnosis: Active Problems:   Chronic post-traumatic stress disorder (PTSD)   Suicide attempt [facility])   Caustic injury gastritis   Ingestion of caustic substance   MDD (major depressive disorder), recurrent episode, severe (HCC)    Discharge Medications: Allergies as of 10/26/2020      Reactions   Penicillins Other (See Comments)   Did it involve swelling of the face/tongue/throat, SOB, or low BP? No Did it involve sudden or severe rash/hives, skin peeling, or any reaction on the inside of your mouth or nose? No Did you need to seek medical attention at a hospital or doctor's office? No When did it last happen?Never If all above answers are "NO", may proceed with cephalosporin use. Unknown-pt's sister is allergic      Medication List    STOP taking these medications   benztropine 1 MG tablet Commonly known as: COGENTIN   metFORMIN 500 MG 24 hr tablet Commonly known as: GLUCOPHAGE-XR   omega-3 acid ethyl esters 1 g capsule Commonly known as: LOVAZA   prenatal multivitamin Tabs tablet   risperiDONE 2 MG tablet Commonly known as: RISPERDAL   risperiDONE microspheres 50 MG injection Commonly known as: RISPERDAL CONSTA   traZODone 150 MG tablet Commonly known as: DESYREL     TAKE these medications   pantoprazole 40 MG tablet Commonly known as: PROTONIX Take 1 tablet (40 mg total) by mouth 2 (two) times daily before a meal.       Disposition and follow-up:   Briana Lynch was discharged from [facility] in Stable condition.  At the hospital follow up visit please address:  1.  Follow-up:  A. Ensure patient is tolerating diet and denies abdominal, throat pain    2.  Labs / imaging needed at time of follow-up: CBC,  BMP  3.  Pending labs/ test needing follow-up: n/a  4.  Medication Changes  Started: Protonix 40 mg BID for 2-4 weeks   Follow-up Appointments: Patient transferred to [facility] Course by problem list: Poison control [phone]    Discharge Subjective: Patient states she feels better. Denies any sore throat, abdominal pain, chest pain. She states she had been getting up and moving around. She had a BM this morning. Has been tolerating regular diet well.   Suicide attempt Ingestion of bleach +/- other medications  Patient reportedly drank household bleach from small bottle, says she drank a few inches of the liquid, and then called father. Her sister was home and called 911. There were also pills around her, she admits to taking some, but does not say how many or of which pills. Mother reported patient could have taken phentermine ~14 tablets missing or uncles benzotropine, lamotrigine and oxcarzapine. On arrival to ED, patient was afebrile, tachycardic, normotensive, O2 stats were appropriate well on RA. Initial work-up with negative salicylate, acetaminophen, EtOH levels, electrolytes normal. Poison control was contacted via ED provider. It was recommended to keep patient NPO, administer IVF, monitor patient with tele, follow electrolytes, and GI consult. GI consulted by ED, recommending PPI drip with plans for endoscopy tomorrow. On hopital day 1 the patient was well appearing, only symptoms at that time were a sore throat and foul tasting spit up. Vitals remain within normal limits. WBC elevated to 11.8 from  from 6.5 on admission, likely reactive. No major electrolyte abnormality on morning labs. Patient had EGD significant for normal esophagus, "congested, discolored and hemorrhagic appearing mucosa in stomach due to caustic injury without ulceration or frank necrosis".  She did not have signs or symptoms of antimuscarinic toxicity. GI recommended Protonix 40 mg BID for 2-4 weeks.  Diet was advanced and at discharge patient was tolerating PO diet and her only complaint was foul taste when eating. She had 1:1 sitter for the entire stay in hospital. Patient was deemed medically stable for transport to [facility].   PTSD Dissociative disorder Hx of psychosis  Patient was discharged from inpatient [facility] 01/09/2019. She was discharged on long acting Risperidone, but stopped taking it after discharge. Has not been on psychiatric medications since. Patient's mood appeared stable on bedside rounds. She engaged in conversation, made eye contact but expressed little emotion given the situation. She did not share about thought process or mental state leading up to suicide attempt. Psychiatry was consulted and saw patient in person. They recommended inpatient treatment pending medical stabilization.   Discharge Exam:   BP 102/67 (BP Location: Right Arm)   Pulse 76   Temp 98.2 F (36.8 C) (Oral)   Resp 18   Ht 5\' 8"  (1.727 m)   Wt 94 kg   LMP 10/17/2020 (Within Days)   SpO2 100%   BMI 31.51 kg/m  Constitutional: well-appearing person sitting in bed, in no acute distress HENT: normocephalic atraumatic, mucous membranes moist Cardiovascular: regular rate and rhythm, no m/r/g Pulmonary/Chest: normal work of breathing on room air, lungs clear to auscultation bilaterally Abdominal: soft, non-tender, non-distended MSK: normal bulk and tone Neurological: alert & oriented x 3 Skin: warm and dry   Pertinent Labs, Studies, and Procedures:  CBC Latest Ref Rng & Units 10/25/2020 10/24/2020 10/23/2020  WBC 4.0 - 10.5 K/uL 6.8 11.8(H) 6.5  Hemoglobin 12.0 - 15.0 g/dL 10.7(L) 11.0(L) 12.5  Hematocrit 36.0 - 46.0 % 32.7(L) 33.3(L) 38.2  Platelets 150 - 400 K/uL 264 249 293    CMP Latest Ref Rng & Units 10/25/2020 10/24/2020 10/23/2020  Glucose 70 - 99 mg/dL 90 90 12/23/2020)  BUN 6 - 20 mg/dL 295(J) 7 6  Creatinine <8(A - 1.00 mg/dL 4.16 6.06 3.01  Sodium 135 - 145 mmol/L 137 138 140  Potassium 3.5 -  5.1 mmol/L 3.8 4.2 3.7  Chloride 98 - 111 mmol/L 108 111 110  CO2 22 - 32 mmol/L 25 19(L) 23  Calcium 8.9 - 10.3 mg/dL 6.01) 0.9(N) 2.3(F)  Total Protein 6.5 - 8.1 g/dL - - -  Total Bilirubin 0.3 - 1.2 mg/dL - - -  Alkaline Phos 38 - 126 U/L - - -  AST 15 - 41 U/L - - -  ALT 0 - 44 U/L - - -    DG Chest Portable 1 View  Result Date: 10/23/2020 CLINICAL DATA:  25 year old who drank approximately 8 ounces of bleach and ingested unspecified medications earlier today. Hematemesis. EXAM: PORTABLE CHEST 1 VIEW COMPARISON:  None. FINDINGS: Suboptimal inspiration accounts for crowded bronchovascular markings, especially in the bases, and accentuates the cardiac silhouette. Taking this into account, cardiomediastinal silhouette unremarkable. Lungs clear. Bronchovascular markings normal. Pulmonary vascularity normal. No visible pleural effusions. No pneumothorax. IMPRESSION: Suboptimal inspiration. No acute cardiopulmonary disease. Electronically Signed   By: 30 M.D.   On: 10/23/2020 19:20     Discharge Instructions: Discharge Instructions    Call MD for:  persistant nausea and vomiting   Complete  by: As directed    Call MD for:  severe uncontrolled pain   Complete by: As directed    Diet - low sodium heart healthy   Complete by: As directed    Discharge instructions   Complete by: As directed    Briana Lynch,  You will be transferred to Inpatient behavioral health for further management. Please continue taking Protonix 40 mg twice daily for the stomach injury from bleach ingestion.   Take care,  Dr. Cyndie Chime   Increase activity slowly   Complete by: As directed       Signed: Lucretia Roers, Medical Student 10/26/2020, 10:20 AM   Pager: [phone]

## 2020-10-26 NOTE — Tx Team (Signed)
Initial Treatment Plan 10/26/2020 2:05 PM [name] [id number]    PATIENT STRESSORS: Marital or family conflict Medication change or noncompliance   PATIENT STRENGTHS: Average or above average intelligence Communication skills Supportive family/friends   PATIENT IDENTIFIED PROBLEMS: Depression     Suicidal attrempt                 DISCHARGE CRITERIA:  Ability to meet basic life and health needs Improved stabilization in mood, thinking, and/or behavior Need for constant or close observation no longer present Verbal commitment to aftercare and medication compliance  PRELIMINARY DISCHARGE PLAN: Outpatient therapy Return to previous living arrangement Return to previous work or school arrangements  PATIENT/FAMILY INVOLVEMENT: This treatment plan has been presented to and reviewed with the patient, Briana Lynch. The patient has been given the opportunity to ask questions and make suggestions.  Chalmers Cater, RN 10/26/2020, 2:05 PM

## 2020-10-26 NOTE — BHH Group Notes (Signed)
LCSW Group Therapy Note  10/26/2020 2:47 PM  Type of Therapy/Topic:  Group Therapy:  Emotion Regulation  Participation Level:  Did Not Attend   Description of Group:   The purpose of this group is to assist patients in learning to regulate negative emotions and experience positive emotions. Patients will be guided to discuss ways in which they have been vulnerable to their negative emotions. These vulnerabilities will be juxtaposed with experiences of positive emotions or situations, and patients will be challenged to use positive emotions to combat negative ones. Special emphasis will be placed on coping with negative emotions in conflict situations, and patients will process healthy conflict resolution skills.  Therapeutic Goals: 1. Patient will identify two positive emotions or experiences to reflect on in order to balance out negative emotions 2. Patient will label two or more emotions that they find the most difficult to experience 3. Patient will demonstrate positive conflict resolution skills through discussion and/or role plays  Summary of Patient Progress: Patient did not attend group despite encouraged participation.    Therapeutic Modalities:   Cognitive Behavioral Therapy Feelings Identification Dialectical Behavioral Therapy  Gwenevere Ghazi, MSW, Bryon Lions 10/26/2020 2:47 PM

## 2020-10-26 NOTE — Plan of Care (Signed)
New admission  Problem: Education: Goal: Utilization of techniques to improve thought processes will improve Outcome: Not Progressing Goal: Knowledge of the prescribed therapeutic regimen will improve Outcome: Not Progressing   Problem: Activity: Goal: Interest or engagement in leisure activities will improve Outcome: Not Progressing Goal: Imbalance in normal sleep/wake cycle will improve Outcome: Not Progressing   Problem: Coping: Goal: Coping ability will improve Outcome: Not Progressing Goal: Will verbalize feelings Outcome: Not Progressing   Problem: Health Behavior/Discharge Planning: Goal: Ability to make decisions will improve Outcome: Not Progressing Goal: Compliance with therapeutic regimen will improve Outcome: Not Progressing   Problem: Role Relationship: Goal: Will demonstrate positive changes in social behaviors and relationships Outcome: Not Progressing   Problem: Safety: Goal: Ability to disclose and discuss suicidal ideas will improve Outcome: Not Progressing Goal: Ability to identify and utilize support systems that promote safety will improve Outcome: Not Progressing   Problem: Self-Concept: Goal: Will verbalize positive feelings about self Outcome: Not Progressing Goal: Level of anxiety will decrease Outcome: Not Progressing   Problem: Education: Goal: Knowledge of [place] Education information/materials will improve Outcome: Not Progressing Goal: Emotional status will improve Outcome: Not Progressing Goal: Mental status will improve Outcome: Not Progressing Goal: Verbalization of understanding the information provided will improve Outcome: Not Progressing   Problem: Activity: Goal: Interest or engagement in activities will improve Outcome: Not Progressing Goal: Sleeping patterns will improve Outcome: Not Progressing   Problem: Coping: Goal: Ability to verbalize frustrations and anger appropriately will improve Outcome: Not  Progressing Goal: Ability to demonstrate self-control will improve Outcome: Not Progressing   Problem: Health Behavior/Discharge Planning: Goal: Identification of resources available to assist in meeting health care needs will improve Outcome: Not Progressing Goal: Compliance with treatment plan for underlying cause of condition will improve Outcome: Not Progressing   Problem: Physical Regulation: Goal: Ability to maintain clinical measurements within normal limits will improve Outcome: Not Progressing   Problem: Safety: Goal: Periods of time without injury will increase Outcome: Not Progressing

## 2020-10-26 NOTE — BHH Suicide Risk Assessment (Signed)
[facility] Admission Suicide Risk Assessment   Nursing information obtained from:  Patient Demographic factors:  Adolescent or young adult Current Mental Status:  NA Loss Factors:  NA Historical Factors:  Impulsivity Risk Reduction Factors:  Living with another person, especially a relative  Total Time spent with patient: 1 hour Principal Problem: MDD (major depressive disorder), recurrent, severe, with psychosis (HCC) Diagnosis:  Principal Problem:   MDD (major depressive disorder), recurrent, severe, with psychosis (HCC) Active Problems:   Chronic post-traumatic stress disorder (PTSD)   Caustic injury gastritis  Subjective Data: Patient is a 25 year old female who transferred from outside hospital after a suicide attempt via bleach ingestion +/- ingestion of other medications. She was medically cleared, and GI has recommended continuation of Protonix 40 mg BID for 2-4 weeks. Patient was assessed one-on-one upon arrival. She is fairly groomed, makes good eye contact, and has a full affect on exam. She smiles appropriately at times when discussing her support system and past academic success. However, she is vague about her mental health symptoms and events leading to suicide attempt. 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 She says she likes her fellow employees, and finds the customers are mostly very nice. However, it can cause her some stress. She cannot think of any other changes in her life. She states she felt somewhat sad and anxious prior to the attempt citing poor sleep as her main symptoms. She felt that the idea of suicide came to her rather suddenly, and she drank bleach impulsively within minutes of the thought. She states since the attempt she has had a lot of time to think and reflect. She no longer has any urge to hurt herself or kill herself. She cites upcoming beach trip, and upcoming birth of a niece as her main reasons for living. She denies any  homicidal ideations, visual hallucinations, and auditory hallucinations. She does not appear to be responding to internal stimuli in the room. She has been tried on Haldol and Risperdal in the past, but notes they made her feel "like a zombie." She is not open to trying any long-acting injectable. RBA of Abilify discussed with patient, and she agrees to try during admission. She also gives permission to speak with mother and father.   Continued Clinical Symptoms:  Alcohol Use Disorder Identification Test Final Score (AUDIT): 0 The "Alcohol Use Disorders Identification Test", Guidelines for Use in Primary Care, Second Edition.  World Science writer [facility]). Score between 0-7:  no or low risk or alcohol related problems. Score between 8-15:  moderate risk of alcohol related problems. Score between 16-19:  high risk of alcohol related problems. Score 20 or above:  warrants further diagnostic evaluation for alcohol dependence and treatment.   CLINICAL FACTORS:   Severe Anxiety and/or Agitation Depression:   Impulsivity Insomnia Severe Unstable or Poor Therapeutic Relationship Previous Psychiatric Diagnoses and Treatments Medical Diagnoses and Treatments/Surgeries   Musculoskeletal: Strength & Muscle Tone: within normal limits Gait & Station: normal Patient leans: N/A  Psychiatric Specialty Exam:  Presentation  General Appearance: Well Groomed  Eye Contact:Good  Speech:Normal Rate  Speech Volume:Normal  Handedness:Right   Mood and Affect  Mood:Euthymic  Affect:Congruent   Thought Process  Thought Processes:Goal Directed  Descriptions of Associations:Intact  Orientation:Full (Time, Place and Person)  Thought Content:Logical  History of Schizophrenia/Schizoaffective disorder:No data recorded Duration of Psychotic Symptoms:No data recorded Hallucinations:Hallucinations: None  Ideas of Reference:None  Suicidal Thoughts:Suicidal Thoughts: No  Homicidal  Thoughts:Homicidal  Thoughts: No   Sensorium  Memory:Immediate Fair; Recent Fair; Remote Fair  Judgment:Fair  Insight:Present   Executive Functions  Concentration:Good  Attention Span:Good  Recall:Good  Fund of Knowledge:Good  Language:Fair   Psychomotor Activity  Psychomotor Activity:Psychomotor Activity: Normal   Assets  Assets:Desire for Improvement; Housing; Leisure Time; Physical Health; Resilience; Social Support; Talents/Skills; Vocational/Educational   Sleep  Sleep:Sleep: Good    Physical Exam: Physical Exam ROS Blood pressure 117/69, pulse 68, temperature 98.1 F (36.7 C), temperature source Oral, resp. rate 18, height 5\' 8"  (1.727 m), weight 109.3 kg, last menstrual period 10/17/2020, SpO2 100 %. Body mass index is 36.64 kg/m.   COGNITIVE FEATURES THAT CONTRIBUTE TO RISK:  Polarized thinking    SUICIDE RISK:   Mild:  Suicidal ideation of limited frequency, intensity, duration, and specificity.  There are no identifiable plans, no associated intent, mild dysphoria and related symptoms, good self-control (both objective and subjective assessment), few other risk factors, and identifiable protective factors, including available and accessible social support.  PLAN OF CARE: Continue inpatient admission, see H&P for details.   I certify that inpatient services furnished can reasonably be expected to improve the patient's condition.   12/17/2020, MD 10/26/2020, 4:24 PM

## 2020-10-26 NOTE — Progress Notes (Signed)
Patient admitted from [facility] with a suicidal attempt.Patient pleasant and cooperative during admission assessment.Patient answer questions in short sentences and " I don't know."  Patient denies SI/HI at this time. Patient denies AVH. Patient informed of fall risk status, fall risk assessed "low" at this time. Patient oriented to unit/staff/room. Patient denies any questions/concerns at this time. Patient safe on unit with Q15 minute checks for safety. Skin assessment and body search done with [facility] RN.No contraband found.

## 2020-10-26 NOTE — H&P (Signed)
Psychiatric Admission Assessment Adult  Patient Identification: Briana Lynch MRN:  [id number] Date of Evaluation:  10/26/2020 Chief Complaint:  MDD (major depressive disorder), recurrent, severe, with psychosis (HCC) [F33.3] Principal Diagnosis: MDD (major depressive disorder), recurrent, severe, with psychosis (HCC) Diagnosis:  Principal Problem:   MDD (major depressive disorder), recurrent, severe, with psychosis (HCC) Active Problems:   Chronic post-traumatic stress disorder (PTSD)  CC "I'm feeling better now."  History of Present Illness: Patient is a 25 year old female who transferred from outside hospital after a suicide attempt via bleach ingestion +/- ingestion of other medications. She was medically cleared, and GI has recommended continuation of Protonix 40 mg BID for 2-4 weeks. Patient was assessed one-on-one upon arrival. She is fairly groomed, makes good eye contact, and has a full affect on exam. She smiles appropriately at times when discussing her support system and past academic success. However, she is vague about her mental health symptoms and events leading to suicide attempt. She notes the only change in her life was recently starting a job in [employer]. She says she likes her fellow employees, and finds the customers are mostly very nice. However, it can cause her some stress. She cannot think of any other changes in her life. She states she felt somewhat sad and anxious prior to the attempt citing poor sleep as her main symptoms. She felt that the idea of suicide came to her rather suddenly, and she drank bleach impulsively within minutes of the thought. She states since the attempt she has had a lot of time to think and reflect. She no longer has any urge to hurt herself or kill herself. She cites upcoming beach trip, and upcoming birth of a niece as her main reasons for living. She denies any homicidal ideations, visual hallucinations, and auditory hallucinations. She does not  appear to be responding to internal stimuli in the room. She has been tried on Haldol and Risperdal in the past, but notes they made her feel "like a zombie." She is not open to trying any long-acting injectable. RBA of Abilify discussed with patient, and she agrees to try during admission. She also gives permission to speak with mother and father.   Contacted mother, Debbe OdeaLatisha [phone]. She notes that Jamesetta Orleansricka was a great daughter and Consulting civil engineerstudent. She was top of her class academically and had thoughts of pursuing law school. However, about six months prior to graduation her mother noticed increased depression. She became shut off and very vague when speaking to others, and experiencing psychosis. [facility] that [facility] and tried on Haldol and Risperdal. She was transitioned to Risperdal injections However, those medications made her extremely tired. She has since started to refuse all medications. They have tried getting her help via therapy and through the church, but feel she would benefit from alternate medication.   Contacted father, Clide CliffRicky, [phone]- No answer, voicemail full Associated Signs/Symptoms: Depression Symptoms:  depressed mood, insomnia, Duration of Depression Symptoms: No data recorded (Hypo) Manic Symptoms:  Distractibility, Impulsivity, Anxiety Symptoms:  Excessive Worry, Psychotic Symptoms:  Denies PTSD Symptoms: Negative Total Time spent with patient: 1 hour  Past Psychiatric History: History of MDD, psychosis, DID, PTSD, and anxiety. One prior admission in 2020 where she was placed on Risperdal and converted to Risperdal consta. Patietn has subsequently discontinued, and is not in current outpatient treatment. No other suicide attempts prior to bleach ingestion.   Is the patient at risk to self? Yes.    Has the patient been  a risk to self in the past 6 months? Yes.    Has the patient been a risk to self within the distant past? No.  Is the patient a risk to  others? No.  Has the patient been a risk to others in the past 6 months? No.  Has the patient been a risk to others within the distant past? No.   Prior Inpatient Therapy:   Prior Outpatient Therapy:    Alcohol Screening: 1. How often do you have a drink containing alcohol?: Never 2. How many drinks containing alcohol do you have on a typical day when you are drinking?: 1 or 2 3. How often do you have six or more drinks on one occasion?: Never AUDIT-C Score: 0 4. How often during the last year have you found that you were not able to stop drinking once you had started?: Never 5. How often during the last year have you failed to do what was normally expected from you because of drinking?: Never 6. How often during the last year have you needed a first drink in the morning to get yourself going after a heavy drinking session?: Never 7. How often during the last year have you had a feeling of guilt of remorse after drinking?: Never 8. How often during the last year have you been unable to remember what happened the night before because you had been drinking?: Never 9. Have you or someone else been injured as a result of your drinking?: No 10. Has a relative or friend or a doctor or another health worker been concerned about your drinking or suggested you cut down?: No Alcohol Use Disorder Identification Test Final Score (AUDIT): 0 Substance Abuse History in the last 12 months:  No. Consequences of Substance Abuse: Negative Previous Psychotropic Medications: Yes  Psychological Evaluations: Yes  Past Medical History:  Past Medical History:  Diagnosis Date  . Ankle sprain and strain 11/15/2010  . Anxiety state 12/09/2012  . PCOS (polycystic ovarian syndrome)     Past Surgical History:  Procedure Laterality Date  . ESOPHAGOGASTRODUODENOSCOPY (EGD) WITH PROPOFOL N/A 10/24/2020   Procedure: ESOPHAGOGASTRODUODENOSCOPY (EGD) WITH PROPOFOL;  Surgeon: Beverley Fiedler, MD;  Location: [facility];   Service: Gastroenterology;  Laterality: N/A;   Family History:  Family History  Problem Relation Age of Onset  . Diabetes Mother   . Hypertension Mother   . Mental illness Sister    Family Psychiatric  History: Sister- Mental illness, paternal Uncle- Schizophrenia Tobacco Screening: Have you used any form of tobacco in the last 30 days? (Cigarettes, Smokeless Tobacco, Cigars, and/or Pipes): Yes Tobacco use, Select all that apply: 5 or more cigarettes per day Are you interested in Tobacco Cessation Medications?: No, patient refused Counseled patient on smoking cessation including recognizing danger situations, developing coping skills and basic information about quitting provided: Yes Social History:  Social History   Substance and Sexual Activity  Alcohol Use Not Currently  . Alcohol/week: 0.0 standard drinks     Social History   Substance and Sexual Activity  Drug Use Yes  . Types: Marijuana   Comment: infrequent    Additional Social History:                           Allergies:   Allergies  Allergen Reactions  . Penicillins Other (See Comments)    Did it involve swelling of the face/tongue/throat, SOB, or low BP? No Did it involve sudden or severe  rash/hives, skin peeling, or any reaction on the inside of your mouth or nose? No Did you need to seek medical attention at a hospital or doctor's office? No When did it last happen?Never If all above answers are "NO", may proceed with cephalosporin use.  Unknown-pt's sister is allergic   Lab Results:  Results for orders placed or performed during the hospital encounter of 10/23/20 (from the past 48 hour(s))  Basic metabolic panel Once     Status: Abnormal   Collection Time: 10/25/20  3:31 AM  Result Value Ref Range   Sodium 137 135 - 145 mmol/L   Potassium 3.8 3.5 - 5.1 mmol/L   Chloride 108 98 - 111 mmol/L   CO2 25 22 - 32 mmol/L   Glucose, Bld 90 70 - 99 mg/dL    Comment: Glucose reference range  applies only to samples taken after fasting for at least 8 hours.   BUN <5 (L) 6 - 20 mg/dL   Creatinine, Ser 3.32 0.44 - 1.00 mg/dL   Calcium 8.5 (L) 8.9 - 10.3 mg/dL   GFR, Estimated >95 >18 mL/min    Comment: (NOTE) Calculated using the CKD-EPI Creatinine Equation (2021)    Anion gap 4 (L) 5 - 15    Comment: Performed at [facility], [address], [place], [place] [address]  CBC Once     Status: Abnormal   Collection Time: 10/25/20  3:31 AM  Result Value Ref Range   WBC 6.8 4.0 - 10.5 K/uL   RBC 3.65 (L) 3.87 - 5.11 MIL/uL   Hemoglobin 10.7 (L) 12.0 - 15.0 g/dL   HCT 06.3 (L) 01.6 - 01.0 %   MCV 89.6 80.0 - 100.0 fL   MCH 29.3 26.0 - 34.0 pg   MCHC 32.7 30.0 - 36.0 g/dL   RDW 93.2 35.5 - 73.2 %   Platelets 264 150 - 400 K/uL   nRBC 0.0 0.0 - 0.2 %    Comment: Performed at [facility], [address], [place], [place] [address]  SARS CORONAVIRUS 2 (TAT 6-24 HRS) Nasopharyngeal Nasopharyngeal Swab     Status: None   Collection Time: 10/25/20  4:39 PM   Specimen: Nasopharyngeal Swab  Result Value Ref Range   SARS Coronavirus 2 NEGATIVE NEGATIVE    Comment: (NOTE) SARS-CoV-2 target nucleic acids are NOT DETECTED.  The SARS-CoV-2 RNA is generally detectable in upper and lower respiratory specimens during the acute phase of infection. Negative results do not preclude SARS-CoV-2 infection, do not rule out co-infections with other pathogens, and should not be used as the sole basis for treatment or other patient management decisions. Negative results must be combined with clinical observations, patient history, and epidemiological information. The expected result is Negative.  Fact Sheet for Patients: HairSlick.no  Fact Sheet for Healthcare Providers: quierodirigir.com  This test is not yet approved or cleared by the [place] FDA and  has been authorized for detection and/or diagnosis of SARS-CoV-2  by FDA under an Emergency Use Authorization (EUA). This EUA will remain  in effect (meaning this test can be used) for the duration of the COVID-19 declaration under Se ction 564(b)(1) of the Act, 21 U.S.C. section 360bbb-3(b)(1), unless the authorization is terminated or revoked sooner.  Performed at [facility], [address] [place], [place] [address]     Blood Alcohol level:  Lab Results  Component Value Date   ETH <10 10/23/2020   ETH <10 01/03/2019  Metabolic Disorder Labs:  Lab Results  Component Value Date   HGBA1C 6.1 (H) 01/04/2016   MPG 128 01/04/2016   MPG 128 (H) 11/19/2014   Lab Results  Component Value Date   PROLACTIN 7.5 12/09/2012   Lab Results  Component Value Date   CHOL 192 (H) 01/04/2016   TRIG 159 (H) 01/04/2016   HDL 44 01/04/2016   CHOLHDL 4.4 01/04/2016   VLDL 32 (H) 01/04/2016   LDLCALC 116 (H) 01/04/2016   LDLCALC 101 11/19/2014    Current Medications: Current Facility-Administered Medications  Medication Dose Route Frequency Provider Last Rate Last Admin  . acetaminophen (TYLENOL) tablet 650 mg  650 mg Oral Q6H PRN Jesse Sans, MD      . alum & mag hydroxide-simeth (MAALOX/MYLANTA) 200-200-20 MG/5ML suspension 30 mL  30 mL Oral Q4H PRN Jesse Sans, MD      . ARIPiprazole (ABILIFY) tablet 5 mg  5 mg Oral Daily Les Pou M, MD      . magnesium hydroxide (MILK OF MAGNESIA) suspension 30 mL  30 mL Oral Daily PRN Jesse Sans, MD      . pantoprazole (PROTONIX) EC tablet 40 mg  40 mg Oral BID AC Jesse Sans, MD       PTA Medications: Medications Prior to Admission  Medication Sig Dispense Refill Last Dose  . pantoprazole (PROTONIX) 40 MG tablet Take 1 tablet (40 mg total) by mouth 2 (two) times daily before a meal. 60 tablet 0     Musculoskeletal: Strength & Muscle Tone: within normal limits Gait & Station: normal Patient leans: N/A            Psychiatric Specialty Exam:  Presentation   General Appearance: Well Groomed  Eye Contact:Good  Speech:Normal Rate  Speech Volume:Normal  Handedness:Right   Mood and Affect  Mood:Euthymic  Affect:Congruent   Thought Process  Thought Processes:Goal Directed  Duration of Psychotic Symptoms: No data recorded Past Diagnosis of Schizophrenia or Psychoactive disorder: No data recorded Descriptions of Associations:Intact  Orientation:Full (Time, Place and Person)  Thought Content:Logical  Hallucinations:Hallucinations: None  Ideas of Reference:None  Suicidal Thoughts:Suicidal Thoughts: No  Homicidal Thoughts:Homicidal Thoughts: No   Sensorium  Memory:Immediate Fair; Recent Fair; Remote Fair  Judgment:Fair  Insight:Present   Executive Functions  Concentration:Good  Attention Span:Good  Recall:Good  Fund of Knowledge:Good  Language:Fair   Psychomotor Activity  Psychomotor Activity:Psychomotor Activity: Normal   Assets  Assets:Desire for Improvement; Housing; Leisure Time; Physical Health; Resilience; Social Support; Talents/Skills; Vocational/Educational   Sleep  Sleep:Sleep: Good    Physical Exam: Physical Exam ROS Blood pressure 117/69, pulse 68, temperature 98.1 F (36.7 C), temperature source Oral, resp. rate 18, height 5\' 8"  (1.727 m), weight 109.3 kg, last menstrual period 10/17/2020, SpO2 100 %. Body mass index is 36.64 kg/m.  Treatment Plan Summary: Daily contact with patient to assess and evaluate symptoms and progress in treatment and Medication management 25 year old female with recent suicide attempt via bleach ingestion. History of MDD and psychosis. Side effects to Haldol and Risperdal in the past. Will start Abilify 5 mg daily and titrate to effect. 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   Observation Level/Precautions:  15 minute checks  Laboratory:  lipid panel, hemoglobin a1c  Psychotherapy:    Medications:     Consultations:    Discharge Concerns:    Estimated LOS:  Other:     Physician Treatment Plan for Primary Diagnosis: MDD (major depressive disorder),  recurrent, severe, with psychosis (HCC) Long Term Goal(s): Improvement in symptoms so as ready for discharge  Short Term Goals: Ability to identify changes in lifestyle to reduce recurrence of condition will improve, Ability to verbalize feelings will improve, Ability to disclose and discuss suicidal ideas, Ability to demonstrate self-control will improve, Ability to identify and develop effective coping behaviors will improve, Ability to maintain clinical measurements within normal limits will improve and Compliance with prescribed medications will improve  Physician Treatment Plan for Secondary Diagnosis: Principal Problem:   MDD (major depressive disorder), recurrent, severe, with psychosis (HCC) Active Problems:   Chronic post-traumatic stress disorder (PTSD)  Long Term Goal(s): Improvement in symptoms so as ready for discharge  Short Term Goals: Ability to identify changes in lifestyle to reduce recurrence of condition will improve, Ability to verbalize feelings will improve, Ability to disclose and discuss suicidal ideas, Ability to demonstrate self-control will improve, Ability to identify and develop effective coping behaviors will improve, Ability to maintain clinical measurements within normal limits will improve and Compliance with prescribed medications will improve  I certify that inpatient services furnished can reasonably be expected to improve the patient's condition.    Jesse Sans, MD 4/13/20224:06 PM

## 2020-10-26 NOTE — TOC Transition Note (Signed)
Transition of Care [facility]) - CM/SW Discharge Note   Patient Details  Name: Briana Lynch MRN: [id number] Date of Birth: 19-Mar-1996  [facility] CM/SW Contact:  Eduard Roux, LCSW Phone Number: 10/26/2020, 12:09 PM   Clinical Narrative:     Patient will Discharge to: [facility]  Discharge Date:10/26/2020 Family Notified: Latisha,mother  Transport By: [facility]   Per MD patient is ready for discharge. RN, patient, and facility notified of discharge. Discharge Summary sent to facility.  RN given number for [phone]  Room 324 Admitting MD Les Pou  [facility] Transport requested for patient.   Clinical Social Worker signing off.  Antony Blackbird, MSW, LCSW Clinical Social Worker    Final next level of care: [facility] Barriers to Discharge: Barriers Resolved   Patient Goals and CMS Choice        Discharge Placement              Patient chooses bed at:  [facility]) Patient to be transferred to facility by: [facility] Transport Name of family member notified: Mother Patient and family notified of of transfer: 10/26/20  Discharge Plan and Services                                     Social Determinants of Health (SDOH) Interventions     Readmission Risk Interventions No flowsheet data found.

## 2020-10-27 ENCOUNTER — Other Ambulatory Visit: Payer: Self-pay

## 2020-10-27 DIAGNOSIS — F333 Major depressive disorder, recurrent, severe with psychotic symptoms: Secondary | ICD-10-CM | POA: Diagnosis not present

## 2020-10-27 LAB — LIPID PANEL
Cholesterol: 204 mg/dL — ABNORMAL HIGH (ref 0–200)
HDL: 45 mg/dL (ref >40)
LDL Cholesterol: 150 mg/dL — ABNORMAL HIGH (ref 0–99)
Total CHOL/HDL Ratio: 4.5 RATIO
Triglycerides: 43 mg/dL (ref <150)
VLDL: 9 mg/dL (ref 0–40)

## 2020-10-27 LAB — HEMOGLOBIN A1C
Hgb A1c MFr Bld: 5.8 % — ABNORMAL HIGH (ref 4.8–5.6)
Mean Plasma Glucose: 119.76 mg/dL

## 2020-10-27 MED ORDER — ARIPIPRAZOLE 10 MG PO TABS
10.0000 mg | ORAL_TABLET | Freq: Every day | ORAL | Status: DC
  Administered 2020-10-28: 10 mg via ORAL
  Filled 2020-10-27: qty 1

## 2020-10-27 MED ORDER — PANTOPRAZOLE SODIUM 40 MG PO TBEC: 40.0000 mg | DELAYED_RELEASE_TABLET | Freq: Two times a day (BID) | ORAL | 0 refills | Status: DC

## 2020-10-27 MED ORDER — ARIPIPRAZOLE 10 MG PO TABS
10.0000 mg | ORAL_TABLET | Freq: Every day | ORAL | 0 refills | Status: DC
  Filled 2020-10-27: qty 7, 7d supply, fill #0

## 2020-10-27 NOTE — Progress Notes (Signed)
[facility] MD Progress Note  10/27/2020 12:42 PM Briana Lynch  MRN:  [id number]   CC "Can we talk about my discharge plan?"  Subjective:  Patient is a 25 year old female who transferred from outside hospital after a suicide attempt via bleach ingestion +/- ingestion of other medications. Patient signed her 72-hour request for discharge yesterday evening. No acute events overnight, medication compliant, attending to ADLs.   Patient seen one-on-one this morning. She denies any side effects to Abilify that was started yesterday. Specifically, she denies fatigue, headache, stomach ache, jitteriness, or muscle tightness. She denies any suicidal ideations, homicidal ideations, visual hallucinations, or auditory hallucinations. She continues to desire discharge soon so that she can attend her family's beach trip for Easter weekend. She is agreeable to continuing medication, and pursuing outpatient follow-up. She consents to conference call with her mother.   Contacted [place], [phone]: Update provided to her. She feels comfortable picking up [place] tomorrow. She confirms that patient does not have insurance, and inquires about available services. Resources in [place] and [place] were discussed. She has no other questions or concerns.   Principal Problem: MDD (major depressive disorder), recurrent, severe, with psychosis (HCC) Diagnosis: Principal Problem:   MDD (major depressive disorder), recurrent, severe, with psychosis (HCC) Active Problems:   Chronic post-traumatic stress disorder (PTSD)   Caustic injury gastritis  Total Time spent with patient: 30 minutes  Past Psychiatric History: See H&P  Past Medical History:  Past Medical History:  Diagnosis Date  . Ankle sprain and strain 11/15/2010  . Anxiety state 12/09/2012  . PCOS (polycystic ovarian syndrome)     Past Surgical History:  Procedure Laterality Date  . ESOPHAGOGASTRODUODENOSCOPY (EGD) WITH PROPOFOL N/A 10/24/2020    Procedure: ESOPHAGOGASTRODUODENOSCOPY (EGD) WITH PROPOFOL;  Surgeon: Beverley Fiedler, MD;  Location: [facility] ENDOSCOPY;  Service: Gastroenterology;  Laterality: N/A;   Family History:  Family History  Problem Relation Age of Onset  . Diabetes Mother   . Hypertension Mother   . Mental illness Sister    Family Psychiatric  History: See H&P Social History:  Social History   Substance and Sexual Activity  Alcohol Use Not Currently  . Alcohol/week: 0.0 standard drinks     Social History   Substance and Sexual Activity  Drug Use Yes  . Types: Marijuana   Comment: infrequent    Social History   Socioeconomic History  . Marital status: Single    Spouse name: Not on file  . Number of children: Not on file  . Years of education: Not on file  . Highest education level: 12th grade  Occupational History  . Not on file  Tobacco Use  . Smoking status: Current Some Day Smoker    Packs/day: 1.00    Types: Cigarettes  . Smokeless tobacco: Never Used  . Tobacco comment: Dad smokes  Vaping Use  . Vaping Use: Never used  Substance and Sexual Activity  . Alcohol use: Not Currently    Alcohol/week: 0.0 standard drinks  . Drug use: Yes    Types: Marijuana    Comment: infrequent  . Sexual activity: Not Currently    Birth control/protection: Injection  Other Topics Concern  . Not on file  Social History Narrative   2014:Sophomore in high school, taking AP classes, enjoys school manages basketball, football and track teams   Social Determinants of Health   Financial Resource Strain: Not on file  Food Insecurity: Not on file  Transportation Needs: Not on file  Physical Activity: Not on  file  Stress: Not on file  Social Connections: Not on file   Additional Social History:                         Sleep: Good  Appetite:  Good  Current Medications: Current Facility-Administered Medications  Medication Dose Route Frequency Provider Last Rate Last Admin  . acetaminophen  (TYLENOL) tablet 650 mg  650 mg Oral Q6H PRN Jesse Sans, MD      . alum & mag hydroxide-simeth (MAALOX/MYLANTA) 200-200-20 MG/5ML suspension 30 mL  30 mL Oral Q4H PRN Jesse Sans, MD      . Melene Muller ON 10/28/2020] ARIPiprazole (ABILIFY) tablet 10 mg  10 mg Oral Daily Jesse Sans, MD      . magnesium hydroxide (MILK OF MAGNESIA) suspension 30 mL  30 mL Oral Daily PRN Jesse Sans, MD      . pantoprazole (PROTONIX) EC tablet 40 mg  40 mg Oral BID AC Jesse Sans, MD   40 mg at 10/27/20 6010    Lab Results:  Results for orders placed or performed during the hospital encounter of 10/26/20 (from the past 48 hour(s))  Hemoglobin A1c     Status: Abnormal   Collection Time: 10/27/20  6:52 AM  Result Value Ref Range   Hgb A1c MFr Bld 5.8 (H) 4.8 - 5.6 %    Comment: (NOTE) Pre diabetes:          5.7%-6.4%  Diabetes:              >6.4%  Glycemic control for   <7.0% adults with diabetes    Mean Plasma Glucose 119.76 mg/dL    Comment: Performed at [facility], [address], [place], [place] [address]  Lipid panel     Status: Abnormal   Collection Time: 10/27/20  6:52 AM  Result Value Ref Range   Cholesterol 204 (H) 0 - 200 mg/dL   Triglycerides 43 <573 mg/dL   HDL 45 >22 mg/dL   Total CHOL/HDL Ratio 4.5 RATIO   VLDL 9 0 - 40 mg/dL   LDL Cholesterol 025 (H) 0 - 99 mg/dL    Comment:        Total Cholesterol/HDL:CHD Risk Coronary Heart Disease Risk Table                     Men   Women  1/2 Average Risk   3.4   3.3  Average Risk       5.0   4.4  2 X Average Risk   9.6   7.1  3 X Average Risk  23.4   11.0        Use the calculated Patient Ratio above and the CHD Risk Table to determine the patient's CHD Risk.        ATP III CLASSIFICATION (LDL):  <100     mg/dL   Optimal  427-062  mg/dL   Near or Above                    Optimal  130-159  mg/dL   Borderline  376-283  mg/dL   High  >151     mg/dL   Very High Performed at [facility], [address], [place], [place] [address]     Blood Alcohol level:  Lab Results  Component Value Date   ETH <10 10/23/2020   ETH <10 01/03/2019  Metabolic Disorder Labs: Lab Results  Component Value Date   HGBA1C 5.8 (H) 10/27/2020   MPG 119.76 10/27/2020   MPG 128 01/04/2016   Lab Results  Component Value Date   PROLACTIN 7.5 12/09/2012   Lab Results  Component Value Date   CHOL 204 (H) 10/27/2020   TRIG 43 10/27/2020   HDL 45 10/27/2020   CHOLHDL 4.5 10/27/2020   VLDL 9 10/27/2020   LDLCALC 150 (H) 10/27/2020   LDLCALC 116 (H) 01/04/2016    Physical Findings: AIMS:  , ,  ,  ,    CIWA:    COWS:     Musculoskeletal: Strength & Muscle Tone: within normal limits Gait & Station: normal Patient leans: N/A  Psychiatric Specialty Exam:  Presentation  General Appearance: Well Groomed  Eye Contact:Good  Speech:Normal Rate  Speech Volume:Normal  Handedness:Right   Mood and Affect  Mood:Euthymic  Affect:Congruent   Thought Process  Thought Processes:Goal Directed  Descriptions of Associations:Intact  Orientation:Full (Time, Place and Person)  Thought Content:Logical  History of Schizophrenia/Schizoaffective disorder:No data recorded Duration of Psychotic Symptoms:No data recorded Hallucinations:Hallucinations: None  Ideas of Reference:None  Suicidal Thoughts:Suicidal Thoughts: No  Homicidal Thoughts:Homicidal Thoughts: No   Sensorium  Memory:Immediate Good; Recent Good; Remote Good  Judgment:Fair  Insight:Present   Executive Functions  Concentration:Good  Attention Span:Good  Recall:Good  Fund of Knowledge:Good  Language:Good   Psychomotor Activity  Psychomotor Activity:Psychomotor Activity: Normal   Assets  Assets:Communication Skills; Desire for Improvement; Housing; Physical Health; Resilience; Social Support; Talents/Skills; Transportation; Vocational/Educational   Sleep  Sleep:Sleep: Good Number of Hours of  Sleep: 8.5    Physical Exam: Physical Exam ROS Blood pressure 99/74, pulse 63, temperature 98.5 F (36.9 C), temperature source Oral, resp. rate 18, height 5\' 8"  (1.727 m), weight 109.3 kg, last menstrual period 10/17/2020, SpO2 97 %. Body mass index is 36.64 kg/m.   Treatment Plan Summary: Daily contact with patient to assess and evaluate symptoms and progress in treatment and Medication management management 25 year old female with recent suicide attempt via bleach ingestion. History of MDD and psychosis. No side effects to Abilify. Increase to 10 mg daily. 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 Tentative discharge home tomorrow to family.   30, MD 10/27/2020, 12:42 PM

## 2020-10-27 NOTE — [facility] Group Notes (Signed)
LCSW Group Therapy Note  10/27/2020 2:13 PM  Type of Therapy/Topic:  Group Therapy:  Balance in Life  Participation Level:  Minimal  Description of Group:    This group will address the concept of balance and how it feels and looks when one is unbalanced. Patients will be encouraged to process areas in their lives that are out of balance and identify reasons for remaining unbalanced. Facilitators will guide patients in utilizing problem-solving interventions to address and correct the stressor making their life unbalanced. Understanding and applying boundaries will be explored and addressed for obtaining and maintaining a balanced life. Patients will be encouraged to explore ways to assertively make their unbalanced needs known to significant others in their lives, using other group members and facilitator for support and feedback.  Therapeutic Goals: 1. Patient will identify two or more emotions or situations they have that consume much of in their lives. 2. Patient will identify signs/triggers that life has become out of balance:  3. Patient will identify two ways to set boundaries in order to achieve balance in their lives:  4. Patient will demonstrate ability to communicate their needs through discussion and/or role plays  Summary of Patient Progress: Patient was present for the majority of the group. She would speak when called on directly but did appear to attend to the conversation.   Therapeutic Modalities:   Cognitive Behavioral Therapy Solution-Focused Therapy Assertiveness Training  [employer]. Algis Greenhouse, MSW, LCSW, LCAS 10/27/2020 2:13 PM

## 2020-10-27 NOTE — Plan of Care (Signed)
Pleasant and cooperative. Active in the milieu, attending groups. Denies SI/HI/VAVH. Taking medications as scheduled and appetite improving. No sign of distress. Support and encouragements provided. Safety monitored as recommended.

## 2020-10-27 NOTE — Discharge Summary (Signed)
Physician Discharge Summary Note  Patient:  Briana Lynch is an 25 y.o., female MRN:  [id number] DOB:  1996-02-07 Patient phone:  [phone] (home)  Patient address:   [address] [place] [address],  Total Time spent with patient: 35 minutes- 25 minutes face-to-face contact with patient, 10 minutes documentation, coordination of care, scripts   Date of Admission:  10/26/2020 Date of Discharge: 10/28/2020  Reason for Admission:  Patient is a 25 year old female who transferred from outside hospital after a suicide attempt via bleach ingestion +/- ingestion of other medications.  Principal Problem: MDD (major depressive disorder), recurrent, severe, with psychosis (HCC) Discharge Diagnoses: Principal Problem:   MDD (major depressive disorder), recurrent, severe, with psychosis (HCC) Active Problems:   Chronic post-traumatic stress disorder (PTSD)   Caustic injury gastritis   Past Psychiatric History: History of MDD, psychosis, DID, PTSD, and anxiety. One prior admission in 2020 where she was placed on Risperdal and converted to Risperdal consta. Patietn has subsequently discontinued, and is not in current outpatient treatment. No other suicide attempts prior to bleach ingestion.   Past Medical History:  Past Medical History:  Diagnosis Date  . Ankle sprain and strain 11/15/2010  . Anxiety state 12/09/2012  . PCOS (polycystic ovarian syndrome)     Past Surgical History:  Procedure Laterality Date  . ESOPHAGOGASTRODUODENOSCOPY (EGD) WITH PROPOFOL N/A 10/24/2020   Procedure: ESOPHAGOGASTRODUODENOSCOPY (EGD) WITH PROPOFOL;  Surgeon: Beverley Fiedler, MD;  Location: [facility] ENDOSCOPY;  Service: Gastroenterology;  Laterality: N/A;   Family History:  Family History  Problem Relation Age of Onset  . Diabetes Mother   . Hypertension Mother   . Mental illness Sister    Family Psychiatric  History: Sister- Mental illness, paternal Uncle- Schizophrenia Social History:  Social History    Substance and Sexual Activity  Alcohol Use Not Currently  . Alcohol/week: 0.0 standard drinks     Social History   Substance and Sexual Activity  Drug Use Yes  . Types: Marijuana   Comment: infrequent    Social History   Socioeconomic History  . Marital status: Single    Spouse name: Not on file  . Number of children: Not on file  . Years of education: Not on file  . Highest education level: 12th grade  Occupational History  . Not on file  Tobacco Use  . Smoking status: Current Some Day Smoker    Packs/day: 1.00    Types: Cigarettes  . Smokeless tobacco: Never Used  . Tobacco comment: Dad smokes  Vaping Use  . Vaping Use: Never used  Substance and Sexual Activity  . Alcohol use: Not Currently    Alcohol/week: 0.0 standard drinks  . Drug use: Yes    Types: Marijuana    Comment: infrequent  . Sexual activity: Not Currently    Birth control/protection: Injection  Other Topics Concern  . Not on file  Social History Narrative   2014:Sophomore in high school, taking AP classes, enjoys school manages basketball, football and track teams   Social Determinants of Health   Financial Resource Strain: Not on file  Food Insecurity: Not on file  Transportation Needs: Not on file  Physical Activity: Not on file  Stress: Not on file  Social Connections: Not on file    Hospital Course:  Patient is a 25 year old female who transferred from outside hospital after a suicide attempt via bleach ingestion +/- ingestion of other medications. She was medically cleared, and GI has recommended continuation of Protonix 40 mg BID  for 2-4 weeks. Upon arrival she did formally sign her request for discharge starting her 72-hour hold limit. She was started on Abilify and titrated to 10 mg daily. She tolerated medication well without side effect. She denies any suicidal ideations, homicidal ideations, visual hallucinations, and auditory hallucinations. She is forward thinking looking forward to  a trip to the beach this weekend with her family for Easter. She notes she is also looking forward to getting a niece soon. She is currently employed, and eager to return to work asking for a note to excuse her absence. Her mother was contacted prior to discharge and also felt safe with her returning home with outpatient follow-up.   Physical Findings: AIMS: Facial and Oral Movements Muscles of Facial Expression: None, normal Lips and Perioral Area: None, normal Jaw: None, normal Tongue: None, normal,Extremity Movements Upper (arms, wrists, hands, fingers): None, normal Lower (legs, knees, ankles, toes): None, normal, Trunk Movements Neck, shoulders, hips: None, normal, Overall Severity Severity of abnormal movements (highest score from questions above): None, normal Incapacitation due to abnormal movements: None, normal Patient's awareness of abnormal movements (rate only patient's report): No Awareness, Dental Status Current problems with teeth and/or dentures?: No Does patient usually wear dentures?: No  CIWA:    COWS:     Musculoskeletal: Strength & Muscle Tone: within normal limits Gait & Station: normal Patient leans: N/A  Psychiatric Specialty Exam: General Appearance: Fairly Groomed  Patent attorney::  Good  Speech:  Clear and Coherent and Normal Rate  Volume:  Normal  Mood:  Euthymic  Affect:  Congruent  Thought Process:  Coherent and Linear  Orientation:  Full (Time, Place, and Person)  Thought Content:  Logical  Suicidal Thoughts:  No  Homicidal Thoughts:  No  Memory:  Immediate;   Fair Recent;   Fair Remote;   Fair  Judgement:  Intact  Insight:  Present  Psychomotor Activity:  Normal  Concentration:  Fair  Recall:  [employer] of Knowledge:Fair  Language: Fair  Akathisia:  Negative  Handed:  Right  AIMS (if indicated):     Assets:  Communication Skills Desire for Improvement Housing Leisure Time Physical Health Resilience Social  Support Talents/Skills Transportation Vocational/Educational  Sleep:  Number of Hours: 8.5  Cognition: WNL  ADL's:  Intact     Physical Exam: Physical Exam Vitals and nursing note reviewed.  Constitutional:      Appearance: Normal appearance.  HENT:     Head: Normocephalic and atraumatic.     Right Ear: External ear normal.     Left Ear: External ear normal.     Nose: Nose normal.     Mouth/Throat:     Mouth: Mucous membranes are moist.     Pharynx: Oropharynx is clear.  Eyes:     Extraocular Movements: Extraocular movements intact.     Conjunctiva/sclera: Conjunctivae normal.     Pupils: Pupils are equal, round, and reactive to light.  Cardiovascular:     Rate and Rhythm: Normal rate.     Pulses: Normal pulses.  Pulmonary:     Effort: Pulmonary effort is normal.     Breath sounds: Normal breath sounds.  Abdominal:     General: Abdomen is flat.     Palpations: Abdomen is soft.  Musculoskeletal:        General: No swelling. Normal range of motion.     Cervical back: Normal range of motion and neck supple.  Skin:    General: Skin is warm and dry.  Neurological:     General: No focal deficit present.     Mental Status: She is alert and oriented to person, place, and time.  Psychiatric:        Mood and Affect: Mood normal.        Behavior: Behavior normal.        Thought Content: Thought content normal.        Judgment: Judgment normal.    Review of Systems  Constitutional: Negative.   HENT: Negative.   Eyes: Negative.   Respiratory: Negative.   Cardiovascular: Negative.   Gastrointestinal: Negative.   Endocrine: Negative.   Genitourinary: Negative.   Musculoskeletal: Negative.   Skin: Negative.   Allergic/Immunologic: Positive for environmental allergies. Negative for food allergies.  Neurological: Negative.   Hematological: Negative.   Psychiatric/Behavioral: Negative for agitation, behavioral problems, decreased concentration, hallucinations,  self-injury, sleep disturbance and suicidal ideas. Blood pressure 107/76, pulse 75, temperature 97.8 F (36.6 C), temperature source Oral, resp. rate 18, height 5\' 8"  (1.727 m), weight 109.3 kg, last menstrual period 10/17/2020, SpO2 100 %. Body mass index is 36.64 kg/m.   Have you used any form of tobacco in the last 30 days? (Cigarettes, Smokeless Tobacco, Cigars, and/or Pipes): Yes  Has this patient used any form of tobacco in the last 30 days? (Cigarettes, Smokeless Tobacco, Cigars, and/or Pipes)  Yes, A prescription for an FDA-approved tobacco cessation medication was offered at discharge and the patient refused  Blood Alcohol level:  Lab Results  Component Value Date   [facility] <10 10/23/2020   ETH <10 01/03/2019    Metabolic Disorder Labs:  Lab Results  Component Value Date   HGBA1C 5.8 (H) 10/27/2020   MPG 119.76 10/27/2020   MPG 128 01/04/2016   Lab Results  Component Value Date   PROLACTIN 7.5 12/09/2012   Lab Results  Component Value Date   CHOL 204 (H) 10/27/2020   TRIG 43 10/27/2020   HDL 45 10/27/2020   CHOLHDL 4.5 10/27/2020   VLDL 9 10/27/2020   LDLCALC 150 (H) 10/27/2020   LDLCALC 116 (H) 01/04/2016    See Psychiatric Specialty Exam and Suicide Risk Assessment completed by Attending Physician prior to discharge.  Discharge destination:  Home  Is patient on multiple antipsychotic therapies at discharge:  No   Has Patient had three or more failed trials of antipsychotic monotherapy by history:  No  Recommended Plan for Multiple Antipsychotic Therapies: NA  Discharge Instructions    Diet general   Complete by: As directed    Increase activity slowly   Complete by: As directed      Allergies as of 10/28/2020      Reactions   Penicillins Other (See Comments)   Did it involve swelling of the face/tongue/throat, SOB, or low BP? No Did it involve sudden or severe rash/hives, skin peeling, or any reaction on the inside of your mouth or nose? No Did you  need to seek medical attention at a hospital or doctor's office? No When did it last happen?Never If all above answers are "NO", may proceed with cephalosporin use. Unknown-pt's sister is allergic      Medication List    TAKE these medications     Indication  ARIPiprazole 10 MG tablet Commonly known as: ABILIFY Take 1 tablet (10 mg total) by mouth daily.  Indication: Major Depressive Disorder   pantoprazole 40 MG tablet Commonly known as: PROTONIX Take 1 tablet (40 mg total) by mouth 2 (two) times daily before a meal.  Indication: caustic injury       Follow-up Information    [facility].   Specialty: Behavioral Health Why: Please present for walkin appoinment on monday, tuesday, wednessday @ 0745.  Contact information: [address] [place] [id number] [phone]              Follow-up recommendations:  Activity:  as tolerated Diet:  regular diet  Comments:  7-day supply free medication provided to patient along with printed 30-day scripts with 1 refill  Signed: Jesse SansMegan M Sharonda Llamas, MD 10/28/2020, 9:33 AM
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
## 2020-10-27 NOTE — Progress Notes (Signed)
Recreation Therapy Notes  Date: 10/27/2020  Time: 9:30 am  Location: Craft Room   Behavioral response: Appropriate   Intervention Topic: Animal Assisted Therapy   Discussion/Intervention:  Animal Assisted Therapy took place today during group.  Animal Assisted Therapy is the planned inclusion of an animal in a patient's treatment plan. The patients were able to engage in therapy with an animal during group. Participants were educated on what a service dog is and the different between a support dog and a service dog. Patient were informed on the many animal needs there are and how their needs are similar. Individuals were enlightened on the process to get a service animal or support animal. Patients got the opportunity to pet the animal and were offered emotional support from the animal and staff.  Clinical Observations/Feedback:  Patient came to group and was on topic and was focused on what peers and staff had to say. Participant shared their experiences and history with animals. Individual was social with peers, staff and animal while participating in group.  Karrin Eisenmenger LRT/CTRS         Jadea Shiffer 10/27/2020 12:50 PM

## 2020-10-27 NOTE — BHH Suicide Risk Assessment (Signed)
BHH INPATIENT:  Family/Significant Other Suicide Prevention Education  Suicide Prevention Education:  Contact Attempts: Ricky Summers/father [phone]) and Lowella Dell Summers/mother [phone]), have been identified by the patient as the family member/significant other with whom the patient will be residing, and identified as the person(s) who will aid the patient in the event of a mental health crisis.  With written consent from the patient, two attempts were made to provide suicide prevention education, prior to and/or following the patient's discharge.  We were unsuccessful in providing suicide prevention education.  A suicide education pamphlet was given to the patient to share with family/significant other.  Date and time of first attempt: 10/27/20 at 14:36 Date and time of second attempt: Second attempt needed  HIPPA compliant voicemail left for father. Mother did not have a voicemail box set up.  [name] 10/27/2020, 2:39 PM

## 2020-10-27 NOTE — BHH Counselor (Signed)
Adult Comprehensive Assessment  Patient ID: Briana Lynch, female   DOB: [birth year], 25 y.o.   MRN: [id number]  Information Source: Information source: Patient  Current Stressors:  Patient states their primary concerns and needs for treatment are:: "Just wasn't feeling too good...just a lot of stress with work." Patient states their goals for this hospitilization and ongoing recovery are:: "Get some rest...make sure I was ok." Educational / Learning stressors: None reported Employment / Job issues: Started a new job which has been stressful Family Relationships: None reported Surveyor, quantity / Lack of resources (include bankruptcy): None reported Housing / Lack of housing: Pt stays with her parents Physical health (include injuries & life threatening diseases): Pt denies Social relationships: None reported Substance abuse: Pt denies Bereavement / Loss: None reported  Living/Environment/Situation:  Living Arrangements: Parent Living conditions (as described by patient or guardian): "Fine" Who else lives in the home?: Parents and a younger sister. How long has patient lived in current situation?: "Year and a half to two years...maybe just a year." What is atmosphere in current home: Comfortable,Loving  Family History:  Marital status: Single Are you sexually active?: No What is your sexual orientation?: Heterosexual Has your sexual activity been affected by drugs, alcohol, medication, or emotional stress?: N/A Does patient have children?: No  Childhood History:  By whom was/is the patient raised?: Both parents Additional childhood history information: Pt reports she was raised by both parents. Description of patient's relationship with caregiver when they were a child: "Good" Patient's description of current relationship with people who raised him/her: "Good" How were you disciplined when you got in trouble as a child/adolescent?: "Time outs" Does patient have siblings?: Yes Number of  Siblings: 3 (Younger sisters) Description of patient's current relationship with siblings: "Fine" Did patient suffer any verbal/emotional/physical/sexual abuse as a child?: No Did patient suffer from severe childhood neglect?: No Has patient ever been sexually abused/assaulted/raped as an adolescent or adult?: No Was the patient ever a victim of a crime or a disaster?: No Witnessed domestic violence?: No Has patient been affected by domestic violence as an adult?: No  Education:  Highest grade of school patient has 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 Has patient ever been in the [employer]?: No  Financial Resources:   Financial resources: Support from parents / caregiver Does patient have a Lawyer or guardian?: No  Alcohol/Substance Abuse:   What has been your use of drugs/alcohol within the last 12 months?: Pt denies any substance use If attempted suicide, did drugs/alcohol play a role in this?:  (N/A) Alcohol/Substance Abuse Treatment Hx: Denies past history If yes, describe treatment: N/A Has alcohol/substance abuse ever caused legal problems?:  (N/A)  Social Support System:   Patient's Community Support System: Good Describe Community Support System: "Parents and sisters" Type of faith/religion: She identifies as spiritual/religious How does patient's faith help to cope with current illness?: Going to church  Leisure/Recreation:    Do You Have Hobbies?: Yes Leisure and Hobbies: "I like to  color and paint."  Strengths/Needs:   What is the patient's perception of their strengths?: "I'm good at doing hair, make up, cooking." Patient states they can use these personal strengths during their treatment to contribute to their recovery: N/A Patient states these barriers may affect/interfere with their treatment: Pt denies barriers Patient states these barriers may affect their return to the community: Pt denies barriers Other important information patient would like considered in planning for their treatment: N/A  Discharge Plan:   Currently receiving community mental health services: No Patient states concerns and preferences for aftercare planning are: She reports she would be interested in treatment post discharge Patient states they will know when they are safe and ready for discharge when: "I just feel well-rested, clear-minded." Does patient have access to transportation?: Yes Does patient have financial barriers related to discharge medications?: Yes Patient description of barriers related to discharge medications: Lack of insurance Will patient be returning to same living situation after discharge?: Yes  Summary/Recommendations:   Summary and Recommendations (to be completed by the evaluator): Patient is a 25 year old, single, woman from [place] [facility] She stated that she came to the hospital because she was not feeling "too good. a lot of stress with work." Noted per H&P to be here due to a suicide attempt via drinking bleach. Patient shared goal of getting some rest and making sure that she is ok. She endorsed plans to return home upon discharge and expressed interest in outpatient treatment. Patient does not have insurance but is employed. She has a primary diagnosis of major depressive disorder, recurrent, severe, with psychosis. Recommendations include crisis stabilization, therapeutic milieu,  encourage group attendance and participation, medication management for mood stabilization, and development of comprehensive mental wellness plan.  [name]. 10/27/2020

## 2020-10-27 NOTE — Plan of Care (Signed)
The patient spent this entire shift on her bed sleeping in a dark room. Verbal contact initiated by Clinical research associate. The patient reports hats he is "fine" and everything is Ok. The patient denies SI/HI/AVH. Emotional support given. Q15 min safety checks maintained.  Problem: Education: Goal: Utilization of techniques to improve thought processes will improve Outcome: Progressing Goal: Knowledge of the prescribed therapeutic regimen will improve Outcome: Progressing   Problem: Activity: Goal: Interest or engagement in leisure activities will improve Outcome: Progressing Goal: Imbalance in normal sleep/wake cycle will improve Outcome: Progressing   Problem: Coping: Goal: Coping ability will improve Outcome: Progressing Goal: Will verbalize feelings Outcome: Progressing   Problem: Health Behavior/Discharge Planning: Goal: Ability to make decisions will improve Outcome: Progressing Goal: Compliance with therapeutic regimen will improve Outcome: Progressing   Problem: Role Relationship: Goal: Will demonstrate positive changes in social behaviors and relationships Outcome: Progressing   Problem: Safety: Goal: Ability to disclose and discuss suicidal ideas will improve Outcome: Progressing Goal: Ability to identify and utilize support systems that promote safety will improve Outcome: Progressing   Problem: Self-Concept: Goal: Will verbalize positive feelings about self Outcome: Progressing Goal: Level of anxiety will decrease Outcome: Progressing   Problem: Education: Goal: Knowledge of [facility] information/materials will improve Outcome: Progressing Goal: Emotional status will improve Outcome: Progressing Goal: Mental status will improve Outcome: Progressing Goal: Verbalization of understanding the information provided will improve Outcome: Progressing   Problem: Activity: Goal: Interest or engagement in activities will improve Outcome: Progressing Goal:  Sleeping patterns will improve Outcome: Progressing   Problem: Coping: Goal: Ability to verbalize frustrations and anger appropriately will improve Outcome: Progressing Goal: Ability to demonstrate self-control will improve Outcome: Progressing   Problem: Health Behavior/Discharge Planning: Goal: Identification of resources available to assist in meeting health care needs will improve Outcome: Progressing Goal: Compliance with treatment plan for underlying cause of condition will improve Outcome: Progressing   Problem: Physical Regulation: Goal: Ability to maintain clinical measurements within normal limits will improve Outcome: Progressing   Problem: Safety: Goal: Periods of time without injury will increase Outcome: Progressing

## 2020-10-28 DIAGNOSIS — F333 Major depressive disorder, recurrent, severe with psychotic symptoms: Secondary | ICD-10-CM | POA: Diagnosis not present

## 2020-10-28 MED ORDER — PANTOPRAZOLE SODIUM 40 MG PO TBEC: 40.0000 mg | DELAYED_RELEASE_TABLET | Freq: Two times a day (BID) | ORAL | 0 refills | Status: DC

## 2020-10-28 MED ORDER — ARIPIPRAZOLE 10 MG PO TABS: 10.0000 mg | ORAL_TABLET | Freq: Every day | ORAL | 1 refills | Status: DC

## 2020-10-28 NOTE — Progress Notes (Signed)
  [facility] Adult Case Management Discharge Plan :  Will you be returning to the same living situation after discharge:  Yes,  pt plans to return to parents' home. At discharge, do you have transportation home?: Yes,  parent to provide transportation. Do you have the ability to pay for your medications: No.  Release of information consent forms completed and in the chart;  Patient's signature needed at discharge.  Patient to Follow up at:  Follow-up Information    [facility] Follow up.   Specialty: Behavioral Health Why: Please present for walkin appoinment on monday, tuesday, wednessday @ 0745.  Contact information: [address] [place] [address] [phone]              Next level of care provider has access to [facility] Link:no  Safety Planning and Suicide Prevention discussed: Yes,  SPE completed with pt.  Have you used any form of tobacco in the last 30 days? (Cigarettes, Smokeless Tobacco, Cigars, and/or Pipes): Yes  Has patient been referred to the Quitline?: Patient refused referral  Patient has been referred for addiction treatment: Pt. refused referral  [name], LCSW 10/28/2020, 10:37 AM

## 2020-10-28 NOTE — Progress Notes (Signed)
Recreation Therapy Notes  Date: 10/28/2020  Time: 9:30 am  Location: Courtyard   Behavioral response: Appropriate   Intervention Topic: Coping-Skills   Discussion/Intervention:  Group content on today was focused on social skills. The group defined social skills and identified ways they use social skills. Patients expressed what obstacles they face when trying to be social. Participants described the importance of social skills. The group listed ways to improve social skills and reasons to improve social skills. Individuals had an opportunity to learn new and improve social skills as well as identify their weaknesses. Clinical Observations/Feedback:  Patient came to group and was focused on what peers and staff had to say about coping skills. Individual was social with peers and staff while participating in the intervention.   Briana Lynch LRT/CTRS            [name] 10/28/2020 10:47 AM

## 2020-10-28 NOTE — Plan of Care (Signed)
Patient to be discharged per MD recommendation. Has been in the milieu, pleasant and cooperative. No sign of distress.

## 2020-10-28 NOTE — Tx Team (Addendum)
Interdisciplinary Treatment and Diagnostic Plan Update  10/28/2020 Time of Session: 9:00AM Briana Lynch MRN: [id number]  Principal Diagnosis: MDD (major depressive disorder), recurrent, severe, with psychosis (HCC)  Secondary Diagnoses: Principal Problem:   MDD (major depressive disorder), recurrent, severe, with psychosis (HCC) Active Problems:   Chronic post-traumatic stress disorder (PTSD)   Caustic injury gastritis   Current Medications:  Current Facility-Administered Medications  Medication Dose Route Frequency Provider Last Rate Last Admin  . acetaminophen (TYLENOL) tablet 650 mg  650 mg Oral Q6H PRN Jesse Sans, MD      . alum & mag hydroxide-simeth (MAALOX/MYLANTA) 200-200-20 MG/5ML suspension 30 mL  30 mL Oral Q4H PRN Jesse Sans, MD      . ARIPiprazole (ABILIFY) tablet 10 mg  10 mg Oral Daily Jesse Sans, MD   10 mg at 10/28/20 0803  . magnesium hydroxide (MILK OF MAGNESIA) suspension 30 mL  30 mL Oral Daily PRN Jesse Sans, MD      . pantoprazole (PROTONIX) EC tablet 40 mg  40 mg Oral BID AC Jesse Sans, MD   40 mg at 10/28/20 0803   PTA Medications: Medications Prior to Admission  Medication Sig Dispense Refill Last Dose  . [DISCONTINUED] pantoprazole (PROTONIX) 40 MG tablet Take 1 tablet (40 mg total) by mouth 2 (two) times daily before a meal. 60 tablet 0     Patient Stressors: Marital or family conflict Medication change or noncompliance  Patient Strengths: Average or above average intelligence Communication skills Supportive family/friends  Treatment Modalities: Medication Management, Group therapy, Case management,  1 to 1 session with clinician, Psychoeducation, Recreational therapy.   Physician Treatment Plan for Primary Diagnosis: MDD (major depressive disorder), recurrent, severe, with psychosis (HCC) Long Term Goal(s): Improvement in symptoms so as ready for discharge Improvement in symptoms so as ready for discharge    Short Term Goals: Ability to identify changes in lifestyle to reduce recurrence of condition will improve Ability to verbalize feelings will improve Ability to disclose and discuss suicidal ideas Ability to demonstrate self-control will improve Ability to identify and develop effective coping behaviors will improve Ability to maintain clinical measurements within normal limits will improve Compliance with prescribed medications will improve Ability to identify changes in lifestyle to reduce recurrence of condition will improve Ability to verbalize feelings will improve Ability to disclose and discuss suicidal ideas Ability to demonstrate self-control will improve Ability to identify and develop effective coping behaviors will improve Ability to maintain clinical measurements within normal limits will improve Compliance with prescribed medications will improve  Medication Management: Evaluate patient's response, side effects, and tolerance of medication regimen.  Therapeutic Interventions: 1 to 1 sessions, Unit Group sessions and Medication administration.  Evaluation of Outcomes: Adequate for Discharge  Physician Treatment Plan for Secondary Diagnosis: Principal Problem:   MDD (major depressive disorder), recurrent, severe, with psychosis (HCC) Active Problems:   Chronic post-traumatic stress disorder (PTSD)   Caustic injury gastritis  Long Term Goal(s): Improvement in symptoms so as ready for discharge Improvement in symptoms so as ready for discharge   Short Term Goals: Ability to identify changes in lifestyle to reduce recurrence of condition will improve Ability to verbalize feelings will improve Ability to disclose and discuss suicidal ideas Ability to demonstrate self-control will improve Ability to identify and develop effective coping behaviors will improve Ability to maintain clinical measurements within normal limits will improve Compliance with prescribed  medications will improve Ability to identify changes in lifestyle to reduce recurrence  of condition will improve Ability to verbalize feelings will improve Ability to disclose and discuss suicidal ideas Ability to demonstrate self-control will improve Ability to identify and develop effective coping behaviors will improve Ability to maintain clinical measurements within normal limits will improve Compliance with prescribed medications will improve     Medication Management: Evaluate patient's response, side effects, and tolerance of medication regimen.  Therapeutic Interventions: 1 to 1 sessions, Unit Group sessions and Medication administration.  Evaluation of Outcomes: Adequate for Discharge   RN Treatment Plan for Primary Diagnosis: MDD (major depressive disorder), recurrent, severe, with psychosis (HCC) Long Term Goal(s): Knowledge of disease and therapeutic regimen to maintain health will improve  Short Term Goals: Ability to remain free from injury will improve, Ability to verbalize frustration and anger appropriately will improve, Ability to demonstrate self-control, Ability to participate in decision making will improve, Ability to verbalize feelings will improve, Ability to disclose and discuss suicidal ideas, Ability to identify and develop effective coping behaviors will improve and Compliance with prescribed medications will improve  Medication Management: RN will administer medications as ordered by provider, will assess and evaluate patient's response and provide education to patient for prescribed medication. RN will report any adverse and/or side effects to prescribing provider.  Therapeutic Interventions: 1 on 1 counseling sessions, Psychoeducation, Medication administration, Evaluate responses to treatment, Monitor vital signs and CBGs as ordered, Perform/monitor CIWA, COWS, AIMS and Fall Risk screenings as ordered, Perform wound care treatments as ordered.  Evaluation of  Outcomes: Adequate for Discharge   LCSW Treatment Plan for Primary Diagnosis: MDD (major depressive disorder), recurrent, severe, with psychosis (HCC) Long Term Goal(s): Safe transition to appropriate next level of care at discharge, Engage patient in therapeutic group addressing interpersonal concerns.  Short Term Goals: Engage patient in aftercare planning with referrals and resources, Increase social support, Increase ability to appropriately verbalize feelings, Increase emotional regulation, Facilitate acceptance of mental health diagnosis and concerns, Identify triggers associated with mental health/substance abuse issues and Increase skills for wellness and recovery  Therapeutic Interventions: Assess for all discharge needs, 1 to 1 time with Social worker, Explore available resources and support systems, Assess for adequacy in community support network, Educate family and significant other(s) on suicide prevention, Complete Psychosocial Assessment, Interpersonal group therapy.  Evaluation of Outcomes: Adequate for Discharge   Progress in Treatment: Attending groups: Yes. Participating in groups: Yes. Taking medication as prescribed: Yes. Toleration medication: Yes. Family/Significant other contact made: Yes, individual(s) contacted:  attempts were made to contact parents by CSW. Provider spoke with mother. Patient understands diagnosis: Yes. Discussing patient identified problems/goals with staff: Yes. Medical problems stabilized or resolved: Yes. Denies suicidal/homicidal ideation: Yes. Issues/concerns per patient self-inventory: No. Other: None.  New problem(s) identified: No, Describe:  None.  New Short Term/Long Term Goal(s): elimination of symptoms of psychosis, medication management for mood stabilization; elimination of SI thoughts; development of comprehensive mental wellness plan.  Patient Goals: "To go home today."   Discharge Plan or Barriers: Pt will continue  treatment on an outpatient basis. She plans to return home and her mother will be picking her up from hospital today.  Reason for Continuation of Hospitalization: Depression  Estimated Length of Stay: 1-7 days   Recreational Therapy: Patient Stressors: N/A Patient Goal: Patient will engage in groups without prompting or encouragement from LRT x3 group sessions within 5 recreation therapy group sessions  Attendees: Patient: Briana Lynch 10/28/2020 10:40 AM  Physician: Les Pou, MD 10/28/2020 10:40 AM  Nursing: Adela Glimpse,  RN 10/28/2020 10:40 AM  RN Care Manager: 10/28/2020 10:40 AM  Social Worker: Vilma Meckel. Algis Greenhouse, MSW, LCSW, LCAS 10/28/2020 10:40 AM  Recreational Therapist: Hilbert Bible, LRT 10/28/2020 10:40 AM  Other:  10/28/2020 10:40 AM  Other:  10/28/2020 10:40 AM  Other: 10/28/2020 10:40 AM    Scribe for Treatment Team: [name], LCSW 10/28/2020 10:40 AM

## 2020-11-15 ENCOUNTER — Other Ambulatory Visit: Payer: Self-pay

## 2020-11-15 ENCOUNTER — Ambulatory Visit (HOSPITAL_COMMUNITY)
Admission: EM | Admit: 2020-11-15 | Discharge: 2020-11-15 | Disposition: A | Discharge status: Home / Self Care | Payer: Self-pay | Attending: Emergency Medicine | Admitting: Emergency Medicine

## 2020-11-15 ENCOUNTER — Encounter (HOSPITAL_COMMUNITY): Payer: Self-pay

## 2020-11-15 DIAGNOSIS — H6002 Abscess of left external ear: Secondary | ICD-10-CM

## 2020-11-15 MED ORDER — LIDOCAINE HCL (PF) 1 % IJ SOLN
INTRAMUSCULAR | Status: AC
  Filled 2020-11-15: qty 30

## 2020-11-15 MED ORDER — DOXYCYCLINE HYCLATE 100 MG PO CAPS: 100.0000 mg | ORAL_CAPSULE | Freq: Two times a day (BID) | ORAL | 0 refills | Status: DC

## 2020-11-15 NOTE — ED Provider Notes (Signed)
[facility]    CSN: [id number] Arrival date & time: 11/15/20  1440      History   Chief Complaint Chief Complaint  Patient presents with  . Otalgia    HPI Briana Lynch is a 25 y.o. female.   Patient presents with left earlobe swelling and tenderness starting 4 days ago, pain radiating down the side of her neck.  Denies itching, decreased hearing, discharge, fever, chills.  Ears are pierced however not recent. Recently changed to new earrings however that was 2 weeks ago.  Past Medical History:  Diagnosis Date  . Ankle sprain and strain 11/15/2010  . Anxiety state 12/09/2012  . PCOS (polycystic ovarian syndrome)     Patient Active Problem List   Diagnosis Date Noted  . MDD (major depressive disorder), recurrent, severe, with psychosis (HCC) 10/26/2020  . MDD (major depressive disorder), recurrent episode, severe (HCC) 10/24/2020  . Caustic injury gastritis   . Ingestion of caustic substance   . Suicide attempt (HCC) 10/23/2020  . Chronic post-traumatic stress disorder (PTSD) 01/04/2019  . Psychosis (HCC) 01/04/2019  . 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 Iron deficiency anemia 10/19/2014  . Vitamin D deficiency 08/20/2014  . Prediabetes 08/20/2014  . Seborrhea capitis 05/18/2014  . PCOS (polycystic ovarian syndrome) 03/03/2013  . Presence of subdermal contraceptive device 03/03/2013  . Urinary incontinence 12/11/2012  . Irregular menses 12/09/2012  . Acne 12/09/2012  . Hirsutism 12/09/2012  . BMI (body mass index), pediatric, > 99% for age 74/27/2014    Past Surgical History:  Procedure Laterality Date  . ESOPHAGOGASTRODUODENOSCOPY (EGD) WITH PROPOFOL N/A 10/24/2020   Procedure: ESOPHAGOGASTRODUODENOSCOPY (EGD) WITH PROPOFOL;  Surgeon: Beverley Fiedler, MD;  Location: [facility] ENDOSCOPY;  Service: Gastroenterology;  Laterality: N/A;    OB History   No obstetric history on file.      Home Medications    Prior to Admission medications    Medication Sig Start Date End Date Taking? Authorizing Provider  doxycycline (VIBRAMYCIN) 100 MG capsule Take 1 capsule (100 mg total) by mouth 2 (two) times daily. 11/15/20  Yes Brigid Vandekamp R, NP  ARIPiprazole (ABILIFY) 10 MG tablet Take 1 tablet (10 mg total) by mouth daily. 10/28/20   Jesse Sans, MD  pantoprazole (PROTONIX) 40 MG tablet Take 1 tablet (40 mg total) by mouth 2 (two) times daily before a meal. 10/28/20 11/27/20  Jesse Sans, MD    Family History Family History  Problem Relation Age of Onset  . Diabetes Mother   . Hypertension Mother   . Mental illness Sister     Social History Social History   Tobacco Use  . Smoking status: Current Some Day Smoker    Packs/day: 1.00    Types: Cigarettes  . Smokeless tobacco: Never Used  . Tobacco comment: Dad smokes  Vaping Use  . Vaping Use: Never used  Substance Use Topics  . Alcohol use: Not Currently    Alcohol/week: 0.0 standard drinks  . Drug use: Yes    Types: Marijuana    Comment: infrequent     Allergies   Penicillins   Review of Systems Review of Systems  Constitutional: Negative.   HENT: Positive for ear pain. Negative for congestion, dental problem, drooling, ear discharge, facial swelling, hearing loss, mouth sores, nosebleeds, postnasal drip, rhinorrhea, sinus pressure, sinus pain, sneezing, sore throat, tinnitus, trouble swallowing and voice change.   Respiratory: Negative.   Cardiovascular: Negative.   Skin: Negative.   Neurological: Negative.  Physical Exam Triage Vital Signs ED Triage Vitals  Enc Vitals Group     BP 11/15/20 1619 123/77     Pulse Rate 11/15/20 1619 89     Resp 11/15/20 1619 19     Temp 11/15/20 1619 98.7 F (37.1 C)     Temp Source 11/15/20 1619 Oral     SpO2 11/15/20 1619 98 %     Weight --      Height --      Head Circumference --      Peak Flow --      Pain Score 11/15/20 1618 10     Pain Loc --      Pain Edu? --      Excl. in GC? --    No data  found.  Updated Vital Signs BP 123/77 (BP Location: Left Arm)   Pulse 89   Temp 98.7 F (37.1 C) (Oral)   Resp 19   LMP 11/12/2020 (Exact Date)   SpO2 98%   Visual Acuity Right Eye Distance:   Left Eye Distance:   Bilateral Distance:    Right Eye Near:   Left Eye Near:    Bilateral Near:     Physical Exam Constitutional:      Appearance: Normal appearance. She is normal weight.  HENT:     Head: Normocephalic.     Right Ear: Hearing, tympanic membrane, ear canal and external ear normal.     Left Ear: Tympanic membrane and ear canal normal.     Ears:     Comments: Swelling and tenderness of Anterior and posterior left earlobe   Eyes:     Extraocular Movements: Extraocular movements intact.  Pulmonary:     Effort: Pulmonary effort is normal.  Musculoskeletal:        General: Normal range of motion.     Cervical back: Normal range of motion.  Lymphadenopathy:     Cervical: Cervical adenopathy present.  Skin:    General: Skin is warm and dry.  Neurological:     Mental Status: She is alert and oriented to person, place, and time. Mental status is at baseline.  Psychiatric:        Mood and Affect: Mood normal.        Behavior: Behavior normal.        Thought Content: Thought content normal.        Judgment: Judgment normal.      UC Treatments / Results  Labs (all labs ordered are listed, but only abnormal results are displayed) Labs Reviewed - No data to display  EKG   Radiology No results found.  Procedures Incision and Drainage  Date/Time: 11/15/2020 5:19 PM Performed by: Valinda Hoar, NP Authorized by: Valinda Hoar, NP   Consent:    Consent obtained:  Verbal   Consent given by:  Patient   Risks, benefits, and alternatives were discussed: yes     Risks discussed:  Bleeding, infection and pain   Alternatives discussed:  Delayed treatment Universal protocol:    Procedure explained and questions answered to patient or proxy's satisfaction:  yes     Patient identity confirmed:  Verbally with patient Location:    Type:  Abscess   Size:  1x2   Location: left earlobe. Pre-procedure details:    Skin preparation:  Chlorhexidine with alcohol Sedation:    Sedation type:  None Anesthesia:    Anesthesia method:  Local infiltration   Local anesthetic:  Lidocaine 1% w/o epi Procedure  type:    Complexity:  Simple Procedure details:    Incision types:  Single straight   Drainage:  Purulent   Drainage amount:  Moderate   Wound treatment:  Wound left open   Packing materials:  None Post-procedure details:    Procedure completion:  Tolerated   (including critical care time)  Medications Ordered in UC Medications - No data to display  Initial Impression / Assessment and Plan / UC Course  I have reviewed the triage vital signs and the nursing notes.  Pertinent labs & imaging results that were available during my care of the patient were reviewed by me and considered in my medical decision making (see chart for details).  Abscess of left earlobe  1. Doxycycline 100 mg bid for 7 days 2. Warm compresses at least 4 times a day  3. Strict return precautions given, verbalized understanding  Final Clinical Impressions(s) / UC Diagnoses   Final diagnoses:  Abscess of left earlobe     Discharge Instructions     Take antibiotic twice a day for the next 7 days  Hold warm compresses to area at least four times a day, the more the better, this helps the area to drain  For worsening pain, swelling, if drainage persist after completion of antibiotics,fever or chills return for reevaluation    ED Prescriptions    Medication Sig Dispense Auth. Provider   doxycycline (VIBRAMYCIN) 100 MG capsule Take 1 capsule (100 mg total) by mouth 2 (two) times daily. 14 capsule Antrell Tipler, Elita Boone, NP     PDMP not reviewed this encounter.   Valinda Hoar, NP 11/15/20 [phone]

## 2020-11-15 NOTE — Discharge Instructions (Addendum)
Take antibiotic twice a day for the next 7 days  Hold warm compresses to area at least four times a day, the more the better, this helps the area to drain  For worsening pain, swelling, if drainage persist after completion of antibiotics,fever or chills return for reevaluation

## 2020-11-15 NOTE — ED Triage Notes (Signed)
Pt c/o ear infection and states she has pain that starts from the left ear to the neck. She states her left earlobe is swollen.

## 2021-01-04 ENCOUNTER — Emergency Department (HOSPITAL_COMMUNITY)
Admission: EM | Admit: 2021-01-04 | Discharge: 2021-01-04 | Disposition: A | Discharge status: Home Health | Payer: Self-pay | Attending: Emergency Medicine | Admitting: Emergency Medicine

## 2021-01-04 ENCOUNTER — Encounter (HOSPITAL_COMMUNITY): Payer: Self-pay | Admitting: *Deleted

## 2021-01-04 ENCOUNTER — Other Ambulatory Visit: Payer: Self-pay

## 2021-01-04 DIAGNOSIS — F17210 Nicotine dependence, cigarettes, uncomplicated: Secondary | ICD-10-CM | POA: Insufficient documentation

## 2021-01-04 DIAGNOSIS — H109 Unspecified conjunctivitis: Secondary | ICD-10-CM | POA: Insufficient documentation

## 2021-01-04 DIAGNOSIS — J069 Acute upper respiratory infection, unspecified: Secondary | ICD-10-CM | POA: Insufficient documentation

## 2021-01-04 MED ORDER — POLYMYXIN B-TRIMETHOPRIM 10000-0.1 UNIT/ML-% OP SOLN: 1.0000 [drp] | OPHTHALMIC | 0 refills | Status: DC

## 2021-01-04 NOTE — Discharge Instructions (Addendum)
Recheck with your primary care provider if not improving after 4 to 5 days.

## 2021-01-04 NOTE — ED Provider Notes (Signed)
[facility] EMERGENCY DEPARTMENT Provider Note   CSN: [id number] Arrival date & time: 01/04/21  0856     History Chief Complaint  Patient presents with   URI    Briana Lynch is a 25 y.o. female.  25 year old female presents with complaint of cough, sneezing, congestion and eye drainage x3 days.  No known fevers, no known sick contacts.  Patient thought that she had a viral illness however reports eyes matted shut for the past few days and concerned she has pinkeye.      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   Iron deficiency anemia 10/19/2014   Vitamin D deficiency 08/20/2014   Prediabetes 08/20/2014   Seborrhea capitis 05/18/2014   PCOS (polycystic ovarian syndrome) 03/03/2013   Presence of subdermal contraceptive device 03/03/2013   Urinary incontinence 12/11/2012   Irregular menses 12/09/2012   Acne 12/09/2012   Hirsutism 12/09/2012   BMI (body mass index), pediatric, > 99% for age 72/27/2014    Past Surgical History:  Procedure Laterality Date   ESOPHAGOGASTRODUODENOSCOPY (EGD) WITH PROPOFOL N/A 10/24/2020   Procedure: ESOPHAGOGASTRODUODENOSCOPY (EGD) WITH PROPOFOL;  Surgeon: Beverley Fiedler, MD;  Location: [facility] ENDOSCOPY;  Service: Gastroenterology;  Laterality: N/A;     OB History   No obstetric history on file.     Family History  Problem Relation Age of Onset   Diabetes Mother     Hypertension Mother    Mental illness Sister     Social History   Tobacco Use   Smoking status: Some Days    Packs/day: 1.00    Pack years: 0.00    Types: Cigarettes   Smokeless tobacco: Never   Tobacco comments:    Dad smokes  Vaping Use   Vaping Use: Never used  Substance Use Topics   Alcohol use: Not Currently    Alcohol/week: 0.0 standard drinks   Drug use: Yes    Types: Marijuana    Comment: infrequent    Home Medications Prior to Admission medications   Medication Sig Start Date End Date Taking? Authorizing Provider  trimethoprim-polymyxin b (POLYTRIM) ophthalmic solution Place 1 drop into both eyes every 4 (four) hours. 01/04/21  Yes Jeannie Fend, PA-C  ARIPiprazole (ABILIFY) 10 MG tablet Take 1 tablet (10 mg total) by mouth daily. 10/28/20   Jesse Sans, MD  doxycycline (VIBRAMYCIN) 100 MG capsule Take 1 capsule (100 mg total) by mouth 2 (two) times daily. 11/15/20   Valinda Hoar, NP  pantoprazole (PROTONIX) 40 MG tablet Take 1 tablet (40 mg total) by mouth 2 (two) times daily before a meal. 10/28/20 11/27/20  Jesse Sans, MD    Allergies    Penicillins  Review of Systems   Review of Systems  Constitutional:  Negative for chills and fever.  HENT:  Positive for congestion, postnasal drip, sneezing and sore throat. Negative for ear pain, facial swelling, rhinorrhea, sinus pressure, sinus pain, trouble swallowing  and voice change.   Eyes:  Positive for discharge and redness.  Respiratory:  Positive for cough. Negative for shortness of breath and wheezing.   Musculoskeletal:  Negative for arthralgias and myalgias.  Skin:  Negative for rash and wound.  Allergic/Immunologic: Negative for immunocompromised state.  Neurological:  Negative for weakness and headaches.  Hematological:  Negative for adenopathy.  All other systems reviewed and are negative.  Physical Exam Updated Vital Signs BP 103/68 (BP Location: Left Arm)   Pulse 71   Temp 98.6 F (37  C) (Oral)   Resp 18   LMP 12/21/2020   SpO2 100%   Physical Exam Vitals and nursing note reviewed.  Constitutional:      General: She is not in acute distress.    Appearance: She is well-developed. She is not diaphoretic.  HENT:     Head: Normocephalic and atraumatic.     Right Ear: Tympanic membrane and ear canal normal.     Left Ear: Tympanic membrane and ear canal normal.     Nose: Nose normal. No congestion or rhinorrhea.     Mouth/Throat:     Mouth: Mucous membranes are moist.     Pharynx: No oropharyngeal exudate or posterior oropharyngeal erythema.  Eyes:     General:        Right eye: No discharge.        Left eye: No discharge.     Conjunctiva/sclera: Conjunctivae normal.  Cardiovascular:     Rate and Rhythm: Normal rate and regular rhythm.     Heart sounds: Normal heart sounds.  Pulmonary:     Effort: Pulmonary effort is normal.     Breath sounds: Normal breath sounds.  Musculoskeletal:     Cervical back: Neck supple.  Lymphadenopathy:     Cervical: No cervical adenopathy.  Skin:    General: Skin is warm and dry.     Findings: No erythema or rash.  Neurological:     Mental Status: She is alert and oriented to person, place, and time.  Psychiatric:        Behavior: Behavior normal.    ED Results / Procedures / Treatments   Labs (all labs ordered are listed, but only abnormal results are displayed) Labs Reviewed - No data to display  EKG None  Radiology No results found.  Procedures Procedures   Medications Ordered in ED Medications - No data to display  ED Course  I have reviewed the triage vital signs and the nursing notes.  Pertinent labs & imaging results that were available during my care of the patient were reviewed by me and considered in my medical decision making (see chart for details).  Clinical Course as of 01/04/21 1512  Wed Jan 04, 2021  1555 25 year old female with URI symptoms and eye drainage x3 days.  Exam is unremarkable.   Due to history of purulent drainage from eyes with matting shut, will offer antibiotic drops.  Discussed possibility of viral infection in which case antibiotics would not help.  Symptoms should resolve however if persisting after 4 to 5 days recommend recheck with PCP. Vitals reassuring including O2 sat 100% on room air.  Patient is afebrile. [LM]    Clinical Course User Index [LM] Alden Hipp   MDM Rules/Calculators/A&P                           Final Clinical Impression(s) / ED Diagnoses Final diagnoses:  Upper respiratory  tract infection, unspecified type  Conjunctivitis of both eyes, unspecified conjunctivitis type    Rx / DC Orders ED Discharge Orders          Ordered    trimethoprim-polymyxin b (POLYTRIM) ophthalmic solution  Every 4 hours        01/04/21 1507             Jeannie Fend, PA-C 01/04/21 1512    Milagros Loll, MD 01/05/21 1600

## 2021-01-04 NOTE — ED Triage Notes (Signed)
Pt has congestion and eye drainage, denies itching or redness. No distress is noted.

## 2021-02-03 ENCOUNTER — Ambulatory Visit (HOSPITAL_COMMUNITY)
Admission: EM | Admit: 2021-02-03 | Discharge: 2021-02-03 | Discharge status: Against Medical Advice | Payer: No Payment, Other

## 2021-02-03 NOTE — BH Assessment (Signed)
TTS triage: Patient presents to [facility] due to insomnia. Took 4 melatonin last night and still no sleep. Usually sleeps 8-12 hours but since she started her new job not sleeping at all. States she is not interested in hospitalization. She denies SI/HI/AVH. States last time she was hospitalized she was on Abilify but she states she cannot afford it.  Patient routine.

## 2021-04-10 ENCOUNTER — Inpatient Hospital Stay (HOSPITAL_COMMUNITY)
Admission: AD | Admit: 2021-04-10 | Discharge: 2021-04-10 | Disposition: A | Discharge status: Home / Self Care | Payer: Medicaid Other | Attending: Family Medicine | Admitting: Family Medicine

## 2021-04-10 ENCOUNTER — Encounter (HOSPITAL_COMMUNITY): Payer: Self-pay

## 2021-04-10 DIAGNOSIS — Z049 Encounter for examination and observation for unspecified reason: Secondary | ICD-10-CM | POA: Diagnosis not present

## 2021-04-10 DIAGNOSIS — O4691 Antepartum hemorrhage, unspecified, first trimester: Secondary | ICD-10-CM | POA: Diagnosis not present

## 2021-04-10 DIAGNOSIS — Z3A01 Less than 8 weeks gestation of pregnancy: Secondary | ICD-10-CM | POA: Insufficient documentation

## 2021-04-10 DIAGNOSIS — Z3491 Encounter for supervision of normal pregnancy, unspecified, first trimester: Secondary | ICD-10-CM | POA: Diagnosis not present

## 2021-04-10 DIAGNOSIS — Z3490 Encounter for supervision of normal pregnancy, unspecified, unspecified trimester: Secondary | ICD-10-CM | POA: Diagnosis present

## 2021-04-10 LAB — POCT PREGNANCY, URINE: Preg Test, Ur: POSITIVE — AB

## 2021-04-10 NOTE — MAU Provider Note (Signed)
Event Date/Time   First Provider Initiated Contact with Patient 04/10/21 1206      S [name] is a 25 y.o. G1P0 patient who presents to MAU today with complaint of vaginal bleeding a couple days ago with a pregnancy test at home.   O BP 119/71   Pulse 77   Temp 98 F (36.7 C)   Resp 18   Ht 5\' 8"  (1.727 m)   Wt 117.9 kg   LMP 03/09/2021   BMI 39.53 kg/m  Physical Exam Vitals and nursing note reviewed.  Pulmonary:     Effort: Pulmonary effort is normal.  Skin:    General: Skin is warm and dry.     Capillary Refill: Capillary refill takes less than 2 seconds.  Neurological:     General: No focal deficit present.  Psychiatric:        Mood and Affect: Mood normal.        Behavior: Behavior normal.        Thought Content: Thought content normal.        Judgment: Judgment normal.    A Medical screening exam complete [redacted]weeks Gestation  P Discharge from MAU in stable condition Referred to [facility] for OB care Warning signs for worsening condition that would warrant emergency follow-up discussed Patient may return to MAU as needed   [facility], DO 04/10/2021 12:06 PM

## 2021-04-10 NOTE — MAU Note (Signed)
Pt reports she took a HPT yesterday and it was positive. Had bleeding like a period two days before but it has stopped no bleeding yesterday or today. Took preg test because period was short only 2 days. Denies any pain or cramping today  no bleeding today.

## 2021-05-05 ENCOUNTER — Encounter (HOSPITAL_COMMUNITY): Payer: Self-pay | Admitting: Obstetrics and Gynecology

## 2021-05-05 ENCOUNTER — Inpatient Hospital Stay (HOSPITAL_COMMUNITY)
Admission: AD | Admit: 2021-05-05 | Discharge: 2021-05-05 | Disposition: A | Discharge status: Home / Self Care | Payer: Medicaid Other | Attending: Obstetrics and Gynecology | Admitting: Obstetrics and Gynecology

## 2021-05-05 ENCOUNTER — Other Ambulatory Visit: Payer: Self-pay

## 2021-05-05 DIAGNOSIS — O210 Mild hyperemesis gravidarum: Secondary | ICD-10-CM | POA: Diagnosis not present

## 2021-05-05 DIAGNOSIS — Z3A00 Weeks of gestation of pregnancy not specified: Secondary | ICD-10-CM | POA: Diagnosis not present

## 2021-05-05 DIAGNOSIS — Z3A08 8 weeks gestation of pregnancy: Secondary | ICD-10-CM | POA: Insufficient documentation

## 2021-05-05 HISTORY — DX: Depression, unspecified: F32.A

## 2021-05-05 LAB — URINALYSIS, ROUTINE W REFLEX MICROSCOPIC
Bilirubin Urine: NEGATIVE
Glucose, UA: NEGATIVE mg/dL
Hgb urine dipstick: NEGATIVE
Ketones, ur: NEGATIVE mg/dL
Leukocytes,Ua: NEGATIVE
Nitrite: NEGATIVE
Protein, ur: 30 mg/dL — AB
Specific Gravity, Urine: 1.025 (ref 1.005–1.030)
pH: 7 (ref 5.0–8.0)

## 2021-05-05 MED ORDER — DOXYLAMINE-PYRIDOXINE 10-10 MG PO TBEC: DELAYED_RELEASE_TABLET | ORAL | 2 refills | Status: DC

## 2021-05-05 MED ORDER — PROMETHAZINE HCL 25 MG PO TABS: 25.0000 mg | ORAL_TABLET | Freq: Four times a day (QID) | ORAL | 1 refills | Status: DC | PRN

## 2021-05-05 MED ORDER — PROMETHAZINE HCL 25 MG PO TABS
25.0000 mg | ORAL_TABLET | Freq: Four times a day (QID) | ORAL | Status: DC | PRN
  Administered 2021-05-05: 25 mg via ORAL
  Filled 2021-05-05: qty 1

## 2021-05-05 NOTE — MAU Note (Signed)
Briana Lynch is a 25 y.o. at [redacted]w[redacted]d here in MAU reporting: has been having nausea and vomiting since [redacted]wks gestation. States it has gotten worse. 3 episodes of emesis since yesterday. No bleeding or pain.  Onset of complaint: ongoing  Pain score: 0/10  Vitals:   05/05/21 1607  BP: 118/64  Pulse: 83  Resp: 16  Temp: 98.5 F (36.9 C)  SpO2: 98%     Lab orders placed from triage: ua

## 2021-05-05 NOTE — Discharge Instructions (Signed)
Keep your appointments as scheduled. Return if your symptoms worsen.

## 2021-05-17 ENCOUNTER — Telehealth (INDEPENDENT_AMBULATORY_CARE_PROVIDER_SITE_OTHER): Payer: Medicaid Other

## 2021-05-17 DIAGNOSIS — Z136 Encounter for screening for cardiovascular disorders: Secondary | ICD-10-CM

## 2021-05-17 DIAGNOSIS — Z3480 Encounter for supervision of other normal pregnancy, unspecified trimester: Secondary | ICD-10-CM | POA: Insufficient documentation

## 2021-05-17 DIAGNOSIS — Z3A00 Weeks of gestation of pregnancy not specified: Secondary | ICD-10-CM

## 2021-05-17 MED ORDER — BLOOD PRESSURE MONITORING DEVI: 1.0000 | 0 refills | Status: DC

## 2021-05-17 NOTE — Progress Notes (Signed)
New OB Intake  I connected with  Briana Lynch on 05/17/21 at 10:15 AM EDT by MyChart Video Visit and verified that I am speaking with the correct person using two identifiers. Nurse is located at [facility] and pt is located at home.  I discussed the limitations, risks, security and privacy concerns of performing an evaluation and management service by telephone and the availability of in person appointments. I also discussed with the patient that there may be a patient responsible charge related to this service. The patient expressed understanding and agreed to proceed.  I explained I am completing New OB Intake today. We discussed her EDD of 12/14/21 that is based on LMP of 03/09/21. Pt is G1/P0. I reviewed her allergies, medications, Medical/Surgical/OB history, and appropriate screenings. I informed her of [facility] services. Based on history, this is a/an  pregnancy uncomplicated .   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   MyChart/Babyscripts MyChart access verified. I explained pt will have some visits in  office and some virtually. Babyscripts instructions given and order placed. Patient verifies receipt of registration text/e-mail. Account successfully created and app downloaded.  Blood Pressure Cuff  Blood pressure cuff ordered for patient to pick-up from [employer]. Explained after first prenatal appt pt will check weekly and document in Babyscripts.  Weight scale: Patient    have weight scale. Weight scale ordered for patient to pick up form [facility].   Anatomy US Explained first scheduled [place] will be around 19 weeks. Anatomy US scheduled for 07/18/20 at 0245p. Pt notified to arrive at 0230p.  Labs Discussed Briana Lynch genetic screening with patient. Would like both Panorama and Horizon drawn at new OB visit. Routine prenatal labs needed.  Covid Vaccine Patient has not covid vaccine.   Mother/ Baby Dyad Candidate?    If yes, offer as possibility  Informed patient of [facility] website  and placed link in her AVS.   Social Determinants of Health Food Insecurity: Patient denies food insecurity. WIC Referral: Patient is interested in referral to [facility]  Transportation: Patient denies transportation needs. Childcare: Discussed no children allowed at ultrasound appointments. Offered childcare services; patient declines childcare services at this time.  Send link to Pregnancy Navigators   Placed OB Box on problem list and updated  First visit review I reviewed new OB appt with pt. I explained she will have a pelvic exam, ob bloodwork with genetic screening, and PAP smear. Explained pt will be seen by Briana Lynch at first visit; encounter routed to appropriate provider. Explained that patient will be seen by pregnancy navigator following visit with provider. [facility] information  placed in AVS.   Briana Lynch, Strong 05/17/2021  10:41 AM

## 2021-05-17 NOTE — Patient Instructions (Addendum)
AREA PEDIATRIC/FAMILY PRACTICE PHYSICIANS  [place] ([address]) [facility] Chambliss, MD; Eniola, MD; Hale, MD; Hensel, MD; McDiarmid, MD; McIntyer, MD; Aleynah Rocchio, MD; Walden, MD [address] [place], [place] [address] [phone] Mon-Fri 8:30-12:30, 1:30-5:00 Providers come to see babies at [facility] Accepting Medicaid [facility] providers who accept newborns: Koirala, MD; Morrow, MD; Wolters, MD [address], [place], [place] [address] [phone] Mon-Fri 8:00-5:30 Babies seen by providers at [facility] Does NOT accept Medicaid Please call early in hospitalization for appointment (limited availability)  [facility] Mulberry, MD [address] [place], [place] [address] [phone] Mon, Tue, Thur, Fri 8:30-5:00, Wed 10:00-7:00 (closed 1-2pm) Babies seen by [facility] providers Accepting Medicaid Rubin - Pediatrician Rubin, MD [address], [place], [place] [address] [phone] Mon-Fri 8:30-5:00, Sat 8:30-12:00 Provider comes to see babies at [facility] Accepting Medicaid Must have been referred from current patients or contacted office prior to delivery [facility] ([facility]) Brown, MD; Chandler, MD; Ettefagh, MD; Grant, MD; Lester, MD; McCormick, MD; McQueen, MD; Prose, MD; Simha, MD; Stanley, MD; Stryffeler, NP; Tebben, NP [address], [place], [place] [address] [phone] Mon, Tue, Thur, Fri 8:30-5:30, Wed 9:30-5:30, Sat 8:30-12:30 Babies seen by [facility] providers Accepting Medicaid Only accepting infants of first-time parents or siblings of current patients Hospital discharge coordinator will make follow-up appointment Jack Amos [address], [place], [place]  [address] [phone]   Fax - [phone] [facility] [address], [place], [place]  [address] Phone - [phone]   Fax - [phone] Shilpa Gosrani [address], [place], [place]  [address] [phone]  [place] ([address]) [facility] Bates, MD; [place]assfield, MD; Cooper, Cox, MD; MD; Davis, MD; Dovico, MD; Ettefaugh, MD; Little, MD; Lowe, MD; Keiffer, MD; Melvin, MD; Sumner, MD; Williams, MD [address], [place], [place] [address] [phone] Mon-Fri 8:30-5:00 (extended evenings Mon-Thur as needed), Sat-Sun 10:00-1:00 Providers come to see babies at [facility] Accepting Medicaid for families of first-time babies and families with all children in the household age 3 and under. Must register with office prior to making appointment (M-F only). [facility] Henson, NP; Knapp, MD; Lalonde, MD; Tysinger, PA [address] [place], [place] [address] [phone] Mon-Fri 8:00-5:00 Babies seen by providers at [facility] Does NOT accept Medicaid/Commercial Insurance Only [facility] ([facility])  Artis, MD; Barnes, MD; Bratton, MD; Coccaro, MD; Lockett Gardner, MD; Kramer, MD; Marshall, MD; Netherton, MD; Poleto, MD; Skinner, MD [address] [place], [place] [address] [phone] Mon-Fri 8:30-5:30, Sat (Oct.-Mar.) 9:00-1:00 Babies seen by providers at [facility] Accepting Medicaid  [place] ([address]) [facility] Reid, MD; Warner, MD [address], [place], [place] [address] [phone] Mon-Fri 8:30-5:00, Sat 8:30-12:00 Providers come to see babies at [facility] Does NOT accept Medicaid [facility] Becker, PA; Hagler, MD; Scifres, PA; Sun, MD; Swayne, MD [address], [place], [place] [address] [phone] Mon-Fri 8:00-5:00 Babies seen by providers at [facility] Does NOT accept Medicaid Only accepting babies of parents who  are patients Please call early in hospitalization for appointment (limited availability) [facility] Clark, MD; Frye, MD; Kelleher, MD; Mack, NP; Miller, MD; O'Keller, MD; Patterson, NP; Pudlo, MD; Puzio, MD; Thomas, MD; Tucker, MD; Twiselton, MD [address], [place], [place] [address] [phone] Mon-Fri 8:00-5:00, Sat 9:00-12:00 Providers come to see babies at [facility] Does NOT accept Medicaid  [place] ([address]) [facility] providers accepting new patients: Brake, NP; Wharton, PA [address], [place], [place] [address] [phone] Mon-Fri 8:00-5:00 Babies seen by providers at [facility] Does NOT accept Medicaid Only accepting babies of parents who are patients Please call early in hospitalization for appointment (limited availability) [facility] Gay, MD; Quinlan, MD [address] [place], [place] [address] [phone] (press 1 to schedule appointment) Mon-Fri 8:00-5:00 Providers come to see babies at [facility] Does NOT accept Medicaid [facility] Mazer, MD [address] [place], [place] [address] [phone] Mon-Fri 8:30-5:00 (lunch 12:30-1:00), extended hours by appointment only Wed 5:00-6:30 Babies seen by [facility] providers Accepting Medicaid [facility] Banks, MD; Jordan, MD; Koberlein, MD [address], [place], [place] [address] [phone] Mon-Fri 8:00-5:00 Babies seen by [facility] providers Does NOT accept Medicaid [facility] Parker, MD; Hunter, MD; Wallace, DO [address] [place], [place] [address] [phone] Mon-Fri 8:00-5:00 Babies seen by [facility] providers Does NOT accept Medicaid [facility] Brandon, PA; Brecken, PA; Christy, NP; Dees, MD; DeClaire, MD; DeWeese, MD; Hansen, NP; Mills, NP; Parrish, NP; Smoot, NP; Summer, MD; Vapne,  MD [address] [place], [place] [address] [phone] Mon-Fri 8:30-5:00, Sat 10:00-1:00 Providers come to see babies at [facility] Does NOT accept Medicaid Free prenatal information session Tuesdays at 4:45pm [facility] Bouska, MD; Gordon, PA; Jeffery, PA; Weber, PA [address] [place] [address] [phone] Mon-Fri 7:30-5:30 Babies seen by [facility] providers [facility] [address], [place], [place]  [address] [phone]   Fax - [phone]  [place] ([address] & [address]) [facility] [place] [address] [place], [place] [address] [phone] Mon-Thur 8:00-6:00 Providers come to see babies at [facility] Accepting Medicaid [facility] Anderson, NP; Badger, MD; Beal, PA; Spencer, PA [address] [place], [place] [address] [phone] Mon-Thur 7:30-7:30, Fri 7:30-4:30 Babies seen by [facility] providers Accepting Medicaid [facility] Agbuya, MD; Klett, NP; Romgoolam, MD [address], [place], [place] [address] [phone] Mon-Fri 8:30-5:00, Sat 8:30-12:00 Providers come to see babies at [facility] Accepting Medicaid Must have "Meet & Greet" appointment at office prior to delivery [facility] ([facility]) McCord, MD; Wallace, MD; Wood, MD [address], [place], [place] [address] [phone] Mon-Wed 8:00-6:00, Thur-Fri 8:00-5:00, Sat 9:00-12:00 Providers come to see babies at [facility] Does NOT accept Medicaid Only accepting siblings of current patients [facility]  [address], [place], [place]  [address] [phone]   Fax - [phone] [facility] [address], [place], [place]  [address] [phone]   Fax -  [phone]  [place] ([address] & [address]) [facility] Cirigliano, DO; Matthews, DO [address] [place], [place] [address] [phone] Mon-Fri 7:00-5:00 Babies seen by [facility] providers Does NOT accept Medicaid [facility] Briscoe, MD; Howley, PA; Moreira, PA [address], [place], [place] [address] [phone] Mon-Fri 8:00-5:00 Babies seen by [facility] providers Accepting Medicaid [facility] Boyd, MD; Church, PA; Jones, NP; Osborn, PA [address], , [place] [address] [phone] Mon-Fri 8:00-5:00 Babies seen by providers at [facility] Accepting Medicaid  [place] ([address]) [facility] [place] Wendling, DO [address] [place], [place] [address] [phone] Mon-Fri 8:00-5:00 Babies seen by [facility] providers Does NOT accept Medicaid Limited availability, please call early in hospitalization to schedule follow-up [facility] Calderon, PA; Cummings, MD; Dillard, MD; Martin, PA; Olson, MD; VanDeven, PA [address], [place], [place] [address] [phone] Mon-Fri 8:30-5:00, Sat 9:00-12:00 Babies seen by providers at [facility] Accepting Medicaid Please register online then schedule online or call office [email or web address] ([facility] Hunter, NP; Kumar, MD; Martin Rogers, PA [address], [place], [place] [address] [phone] Mon-Fri 8:00-5:00 Babies seen by providers at [facility] Accepting Medicaid [facility] ([facility]) Dewey, MD; Kristi Fleenor, NP; West, MD [address], [place], [place] [address] [phone] Mon-Fri 8:00-5:30, Sat&Sun by appointment (phones open at  8:30) Babies seen by [facility] providers Accepting Medicaid Must be a first-time baby or sibling of current patient [facility] [place]  [address], [place], [place]  [address] [phone]   Fax - [phone]  [place] ([address]) [facility] Brown, PA; Cowen, PA; Rice, MD; Helton, PA; Spry, MD [address] [place], [place] [address] [phone] Mon-Thur 8:00-7:00, Fri 8:00-5:00, Sat 8:00-12:00, Sun 9:00-12:00 Babies seen by [facility] providers Accepting Medicaid [facility] Coe-Goins, MD; Marshall, MD; Pierre-Louis, MD [address], [place], [place] [address] [phone] Mon-Thur 8:00-5:00 Babies seen by providers at [facility] Accepting Medicaid [facility] Bratton, MD; Coe-Goins, MD; Hayes, MD; Lewis, MD; List, MD; Lott, MD; Marshall, MD; Moran, MD; O'Ndea Kilroy, MD; Pierre-Louis, MD; Pitonzo, MD; Scholer, MD; Spangle, MD [address] [place], [place] [address] [phone] Mon-Fri 8:00-5:30, Sat (Oct.-Mar.) 9:00-1:00 Babies seen by providers at [facility] Accepting Medicaid Must fill out new patient packet, available online at [email or web address] [facility] ([facility]) Friddle, NP; Harris, NP; Kelly, NP; Logan, MD; Melvin, PA; Poth, MD; Ramadoss, MD; Stanton, NP [address], [place], [place] [address] [phone] Mon-Thur 8:00-5:30, Fri 8:00-5:00 Babies seen by providers at [facility] Accepting Medicaid  [place] ([address]) [facility] Dixon, PA; Odell, MD; Pickard, MD; Tapia, PA [address], [place], [place] [address] [phone] Mon-Fri 8:00-5:00 Babies seen by providers at [facility] Accepting Medicaid   [place] ([address]) [facility] [place] Masneri, DO; Meyers, MD; Nelson, PA [address], [place], [place] [address] [phone] Mon-Fri 8:00-5:00 Babies seen by providers at [facility] Does NOT accept Medicaid Limited appointment availability, please call early in hospitalization  [facility] [place] Kunedd, DO; McGowen, MD [address], [place], [place] [address] [phone] Mon-Fri 8:00-5:00 Babies seen by [facility] providers Does NOT accept Medicaid [facility] [place] Cameron, MD; MacDonald, MD; Michaels, PA; Nayak, MD [address] [place] [address], [place], [place] [address] [phone] Mon-Fri 8:00-5:00 After hours clinic ([address] [place],  [address]) [phone] Mon-Fri 5:00-8:00, Sat 12:00-6:00, Sun 10:00-4:00 Babies seen by [facility] providers Accepting Medicaid [facility] [place] [address], [place], [place]  [address] [phone]   Fax - [phone]  [place] ([address]) [facility] [place] [facility] Andy, MD [address], [place], [place] [address] [phone] Mon-Fri 8:00-5:00 Babies seen by [facility] providers Does NOT accept Medicaid [facility] [place] ([facility] [place]) Eksir, MD [address], [place], [place] [address] [phone] Mon-Thur 8:00-7:00, Fri 8:00-5:00, Sat 8:00-12:00 Babies seen by providers at [facility] Accepting Medicaid - but does not have vaccinations in office (must be received elsewhere) Limited availability, please call early in hospitalization  [place] ([address]) [facility]  Charlene Flemming, MD [address], [place] [address] [phone]  Fax [phone]  [facility]  Kimberly [place], MD, Annamarie Streilein, PA, Carla Hampton, PA [address] [place], [place]  [address] [phone] [facility]  [address], [place], [place] [address] [phone] [address], [place], [place] [address] [phone] (West Office)  [facility] [address], [place], [place] [address] [phone] [facility] [address], [place], [place] [address] [phone] [facility] [address], [place], [place] [address] [phone] [facility] [address], [place], [place] [address] [phone] [facility] [address], [place], [place] [address] [phone] [facility] [address], [place], [place] [address] [phone] [facility]  [address], [place], [place] [address] [phone] Dr. [facility] [address] [place] [address], [place], [place] [address] [phone] [facility] [address], [place], [place] [address] [phone] [facility] [address], , [place] [address] [phone]   At our [facility], we work as an integrated team, providing care to address both physical and emotional health. Your medical provider may refer you to see our Behavioral Health Clinician (BHC) on the same day you see your medical provider, as availability permits; often scheduled virtually at your convenience.  Our BHC is available to all patients, visits generally last between 20-30 minutes, but can be longer or shorter, depending on patient need. The BHC offers help with stress management, coping with symptoms of depression and anxiety, major life changes , sleep issues, changing risky behavior, grief and loss, life stress, working on personal life goals, and  behavioral health issues, as these all affect your overall health and wellness.  The BHC is NOT available for the following: FMLA paperwork, court-ordered evaluations, specialty assessments (custody or disability), letters to employers, or  obtaining certification for an emotional support animal. The BHC does not provide long-term therapy. You have the right to refuse integrated behavioral health services, or to reschedule to see the BHC at a later date.  Confidentiality exception: If it is suspected that a child or disabled adult is being abused or neglected, we are required by law to report that to either Child Protective Services or Adult Protective Services.  If you have a diagnosis of Bipolar affective disorder, Schizophrenia, or recurrent Major depressive disorder, we will recommend that you establish care with a psychiatrist, as these are lifelong, chronic conditions, and we want your overall emotional health and medications to be more closely monitored. If you anticipate needing extended maternity leave due to mental health issues postpartum, it it recommended you inform your medical provider, so we can put in a referral to a psychiatrist as soon as possible. The BHC is unable to recommend an extended maternity leave for mental health issues. Your medical provider   or BHC may refer you to a therapist for ongoing, traditional therapy, or to a psychiatrist, for medication management, if it would benefit your overall health. Depending on your insurance, you may have a copay or be charged a deductible, depending on your insurance, to see the BHC. If you are uninsured, it is recommended that you apply for financial assistance. (Forms may be requested at the front desk for in-person visits, via MyChart, or request a form during a virtual visit).  If you see the BHC more than 6 times, you will have to complete a comprehensive clinical assessment interview with the BHC to resume integrated services.  For virtual visits with the BHC, you must be physically in the state of [place] at the time of the visit. For example, if you live in [place], you will have to do an in-person visit with the BHC, and your out-of-state insurance may not cover  behavioral health services in [place]. If you are going out of the state or country for any reason, the BHC may see you virtually when you return to [place], but not while you are physically outside of .   

## 2021-05-22 NOTE — MAU Provider Note (Signed)
History     CSN: [id number]  Arrival date and time: 05/05/21 1544   Event Date/Time   First Provider Initiated Contact with Patient 05/05/21 1617      Chief Complaint  Patient presents with   Emesis   HPI LAKENDRIA NICASTRO 25 y.o. First trimester pregnancy  Comes to MAU as she has been having nausea and occasional vomiting.  Diet recall today - has kept down food today.   OB History     Gravida  1   Para      Term      Preterm      AB      Living         SAB      IAB      Ectopic      Multiple      Live Births              Past Medical History:  Diagnosis Date   Ankle sprain and strain 11/15/2010   Anxiety state 12/09/2012   Depression    PCOS (polycystic ovarian syndrome)     Past Surgical History:  Procedure Laterality Date   ESOPHAGOGASTRODUODENOSCOPY (EGD) WITH PROPOFOL N/A 10/24/2020   Procedure: ESOPHAGOGASTRODUODENOSCOPY (EGD) WITH PROPOFOL;  Surgeon: Beverley Fiedler, MD;  Location: [facility] ENDOSCOPY;  Service: Gastroenterology;  Laterality: N/A;    Family History  Problem Relation Age of Onset   Diabetes Mother    Hypertension Mother    Mental illness Sister     Social History   Tobacco Use   Smoking status: Every Day    Packs/day: 0.25    Types: Cigarettes   Smokeless tobacco: Never   Tobacco comments:    Dad smokes  Vaping Use   Vaping Use: Never used  Substance Use Topics   Alcohol use: Not Currently    Alcohol/week: 0.0 standard drinks   Drug use: Yes    Types: Marijuana    Comment: infrequent    Allergies:  Allergies  Allergen Reactions   Penicillins Other (See Comments)    Did it involve swelling of the face/tongue/throat, SOB, or low BP? No Did it involve sudden or severe rash/hives, skin peeling, or any reaction on the inside of your mouth or nose? No Did you need to seek medical attention at a hospital or doctor's office? No When did it last happen? Never      If all above answers are "NO", may proceed with  cephalosporin use.  Unknown-pt's sister is allergic    No medications prior to admission.    Review of Systems  Constitutional:  Negative for fever.  Gastrointestinal:  Negative for nausea and vomiting.  Genitourinary:  Negative for dysuria, vaginal bleeding and vaginal discharge.  Musculoskeletal:  Negative for back pain.  Physical Exam   Blood pressure 108/68, pulse 81, temperature 98.5 F (36.9 C), temperature source Oral, resp. rate 16, height 5\' 8"  (1.727 m), weight 119 kg, last menstrual period 03/09/2021, SpO2 98 %.  Physical Exam Vitals and nursing note reviewed.  Constitutional:      Appearance: She is well-developed. She is not ill-appearing.  HENT:     Head: Normocephalic.  Cardiovascular:     Rate and Rhythm: Regular rhythm.  Pulmonary:     Effort: Pulmonary effort is normal.  Abdominal:     Palpations: Abdomen is soft.     Tenderness: There is no abdominal tenderness. There is no guarding or rebound.  Musculoskeletal:  General: Normal range of motion.     Cervical back: Neck supple.  Skin:    General: Skin is warm and dry.  Neurological:     Mental Status: She is alert and oriented to person, place, and time.  Psychiatric:        Mood and Affect: Mood normal.        Behavior: Behavior normal.        Thought Content: Thought content normal.    MAU Course  Procedures LABS No ketones seen in urinalysis  MDM Seems well but having nausea.  Does not want IVF.  Will give tablet of phenergan now and with no vomiting may go home. Discussed at length - handling nausea and vomiting in pregnancy.  Assessment and Plan  Morning sickness  Plan Prescribed diclegis  and reviewed instructions on taking medication. No vomiting in MAU today. Prescribed Phenergan for vomiting if needed.  Reviewed how to take Phenergan. Already has new OB appointment scheduled.  Ailine Hefferan L Linkin Vizzini 05/22/2021, 5:17 PM

## 2021-05-24 ENCOUNTER — Encounter: Payer: Self-pay | Admitting: Obstetrics and Gynecology

## 2021-05-25 ENCOUNTER — Other Ambulatory Visit: Payer: Self-pay

## 2021-05-25 ENCOUNTER — Ambulatory Visit (INDEPENDENT_AMBULATORY_CARE_PROVIDER_SITE_OTHER): Payer: Medicaid Other | Admitting: Family Medicine

## 2021-05-25 ENCOUNTER — Other Ambulatory Visit (HOSPITAL_COMMUNITY)
Admission: RE | Admit: 2021-05-25 | Discharge: 2021-05-25 | Disposition: A | Discharge status: Home / Self Care | Payer: Medicaid Other | Source: Ambulatory Visit | Attending: Obstetrics and Gynecology | Admitting: Obstetrics and Gynecology

## 2021-05-25 VITALS — BP 110/72 | HR 83 | Wt 263.9 lb

## 2021-05-25 DIAGNOSIS — O99011 Anemia complicating pregnancy, first trimester: Secondary | ICD-10-CM

## 2021-05-25 DIAGNOSIS — O99210 Obesity complicating pregnancy, unspecified trimester: Secondary | ICD-10-CM | POA: Insufficient documentation

## 2021-05-25 DIAGNOSIS — Z3480 Encounter for supervision of other normal pregnancy, unspecified trimester: Secondary | ICD-10-CM

## 2021-05-25 DIAGNOSIS — O99330 Smoking (tobacco) complicating pregnancy, unspecified trimester: Secondary | ICD-10-CM | POA: Insufficient documentation

## 2021-05-25 DIAGNOSIS — R7303 Prediabetes: Secondary | ICD-10-CM

## 2021-05-25 DIAGNOSIS — O99331 Smoking (tobacco) complicating pregnancy, first trimester: Secondary | ICD-10-CM

## 2021-05-25 DIAGNOSIS — O99211 Obesity complicating pregnancy, first trimester: Secondary | ICD-10-CM

## 2021-05-25 LAB — OB RESULTS CONSOLE GC/CHLAMYDIA: Gonorrhea: NEGATIVE

## 2021-05-25 MED ORDER — ASPIRIN EC 81 MG PO TBEC: 81.0000 mg | DELAYED_RELEASE_TABLET | Freq: Every day | ORAL | 2 refills | Status: DC

## 2021-05-25 NOTE — Progress Notes (Signed)
INITIAL PRENATAL VISIT  Subjective:   Briana Lynch is being seen today for her first obstetrical visit.  This is not a planned pregnancy. This is a desired pregnancy.  She is at [redacted]w[redacted]d gestation by LMP Her obstetrical history is significant for obesity and smoker. Relationship with FOB: significant other, not living together. Patient does intend to breast feed. Pregnancy history fully reviewed.  Patient reports nausea.  Indications for ASA therapy (per uptodate) One of the following: Previous pregnancy with preeclampsia, especially early onset and with an adverse outcome No Multifetal gestation No Chronic hypertension No Type 1 or 2 diabetes mellitus No Chronic kidney disease No Autoimmune disease (antiphospholipid syndrome, systemic lupus erythematosus) No  Two or more of the following: Nulliparity Yes Obesity (body mass index >30 kg/m2) Yes Family history of preeclampsia in mother or sister No Age ?35 years No Sociodemographic characteristics (African American race, low socioeconomic level) Yes Personal risk factors (eg, previous pregnancy with low birth weight or small for gestational age infant, previous adverse pregnancy outcome [eg, stillbirth], interval >10 years between pregnancies) No  Indications for early GDM screening  First-degree relative with diabetes No BMI >30kg/m2 Yes Age > 25 Yes Previous birth of an infant weighing ?4000 g No Gestational diabetes mellitus in a previous pregnancy No Glycated hemoglobin ?5.7 percent (39 mmol/mol), impaired glucose tolerance, or impaired fasting glucose on previous testing No High-risk race/ethnicity (eg, African American, Latino, Native American, Asian American, Pacific Islander) Yes Previous stillbirth of unknown cause No Maternal birthweight > 9 lbs No History of cardiovascular disease No Hypertension or on therapy for hypertension No High-density lipoprotein cholesterol level <35 mg/dL (0.90 mmol/L) and/or a  triglyceride level >250 mg/dL (2.82 mmol/L) No Polycystic ovary syndrome Yes Physical inactivity No Other clinical condition associated with insulin resistance (eg, severe obesity, acanthosis nigricans) No Current use of glucocorticoids No   Early screening tests: FBS, A1C, Random CBG, glucose challenge   Review of Systems:   Review of Systems  Objective:    Obstetric History OB History  Gravida Para Term Preterm AB Living  1            SAB IAB Ectopic Multiple Live Births               # Outcome Date GA Lbr Len/2nd Weight Sex Delivery Anes PTL Lv  1 Current             Past Medical History:  Diagnosis Date   Ankle sprain and strain 11/15/2010   Anxiety state 12/09/2012   Depression    PCOS (polycystic ovarian syndrome)     Past Surgical History:  Procedure Laterality Date   ESOPHAGOGASTRODUODENOSCOPY (EGD) WITH PROPOFOL N/A 10/24/2020   Procedure: ESOPHAGOGASTRODUODENOSCOPY (EGD) WITH PROPOFOL;  Surgeon: Jerene Bears, MD;  Location: [facility] ENDOSCOPY;  Service: Gastroenterology;  Laterality: N/A;    Current Outpatient Medications on File Prior to Visit  Medication Sig Dispense Refill   Blood Pressure Monitoring DEVI 1 each by Does not apply route once a week. 1 each 0   Doxylamine-Pyridoxine (DICLEGIS) 10-10 MG TBEC Take 2 tablets at bedtime and one in the morning and one in the afternoon as needed for nausea. (Patient not taking: Reported on 05/17/2021) 60 tablet 2   Prenatal MV & Min w/FA-DHA (PRENATAL GUMMIES) 0.18-25 MG CHEW Chew by mouth.     promethazine (PHENERGAN) 25 MG tablet Take 1 tablet (25 mg total) by mouth every 6 (six) hours as needed for nausea or vomiting. Can take  1/2 tablet for milder symptoms. 30 tablet 1   No current facility-administered medications on file prior to visit.    Allergies  Allergen Reactions   Penicillins Other (See Comments)    Did it involve swelling of the face/tongue/throat, SOB, or low BP? No Did it involve sudden or severe  rash/hives, skin peeling, or any reaction on the inside of your mouth or nose? No Did you need to seek medical attention at a hospital or doctor's office? No When did it last happen? Never      If all above answers are "NO", may proceed with cephalosporin use.  Unknown-pt's sister is allergic    Social History:  reports that she has been smoking cigarettes. She has been smoking an average of .25 packs per day. She has never used smokeless tobacco. She reports that she does not currently use alcohol. She reports current drug use. Drug: Marijuana.  Family History  Problem Relation Age of Onset   Diabetes Mother    Hypertension Mother    Mental illness Sister     The following portions of the patient's history were reviewed and updated as appropriate: allergies, current medications, past family history, past medical history, past social history, past surgical history and problem list.  Review of Systems Review of Systems  Constitutional:  Negative for chills and fever.  HENT:  Negative for congestion and sore throat.   Eyes:  Negative for pain and visual disturbance.  Respiratory:  Negative for cough, chest tightness and shortness of breath.   Cardiovascular:  Negative for chest pain.  Gastrointestinal:  Negative for abdominal pain, diarrhea, nausea and vomiting.  Endocrine: Negative for cold intolerance and heat intolerance.  Genitourinary:  Negative for dysuria and flank pain.  Musculoskeletal:  Negative for back pain.  Skin:  Negative for rash.  Allergic/Immunologic: Negative for food allergies.  Neurological:  Negative for dizziness and light-headedness.  Psychiatric/Behavioral:  Negative for agitation.      Physical Exam:  LMP 03/09/2021  CONSTITUTIONAL: Well-developed, well-nourished female in no acute distress.  HENT:  Normocephalic, atraumatic, External right and left ear normal. Oropharynx is clear and moist EYES: Conjunctivae normal. No scleral icterus.  NECK: Normal  range of motion, supple, no masses.  Normal thyroid.  SKIN: Skin is warm and dry. No rash noted. Not diaphoretic. No erythema. No pallor. MUSCULOSKELETAL: Normal range of motion. No tenderness.  No cyanosis, clubbing, or edema.   NEUROLOGIC: Alert and oriented to person, place, and time. Normal muscle tone coordination.  PSYCHIATRIC: Normal mood and affect. Normal behavior. Normal judgment and thought content. CARDIOVASCULAR: Normal heart rate noted, regular rhythm RESPIRATORY: Clear to auscultation bilaterally. Effort and breath sounds normal, no problems with respiration noted. BREASTS: Symmetric in size. No masses, skin changes, nipple drainage, or lymphadenopathy. ABDOMEN: Soft, normal bowel sounds, no distention noted.  No tenderness, rebound or guarding. Fundal ht: below pelvis PELVIC: Normal appearing external genitalia; normal appearing vaginal mucosa and cervix.  No abnormal discharge noted.  Pap smear obtained.  Normal uterine size, no other palpable masses, no uterine or adnexal tenderness. FHR: 150   Assessment:    Pregnancy: G1P0000 1. Supervision of other normal pregnancy, antepartum - Genetic Screening - CBC/D/Plt+RPR+Rh+ABO+RubIgG... - Culture, OB Urine - Hemoglobin A1c - Cytology - PAP( [facility]) - aspirin EC 81 MG tablet; Take 1 tablet (81 mg total) by mouth daily. Take after 12 weeks for prevention of preeclampsia later in pregnancy  Dispense: 300 tablet; Refill: 2  2. Obesity affecting pregnancy in  first trimester - aspirin EC 81 MG tablet; Take 1 tablet (81 mg total) by mouth daily. Take after 12 weeks for prevention of preeclampsia later in pregnancy  Dispense: 300 tablet; Refill: 2     Plan:     Initial labs drawn. Prenatal vitamins. Problem list reviewed and updated. Reviewed in detail the nature of the practice with collaborative care between  Genetic screening discussed: NIPS/First trimester screen/Quad/AFP requested. Role of ultrasound in pregnancy  discussed; Anatomy US: requested. Amniocentesis discussed: not indicated. Follow up in 4 weeks. Discussed clinic routines, schedule of care and testing, genetic screening options, involvement of students and residents under the direct supervision of APPs and doctors and presence of female providers. Pt verbalized understanding.   Caren Macadam, MD 05/25/2021 3:49 PM

## 2021-05-26 ENCOUNTER — Encounter: Payer: Self-pay | Admitting: *Deleted

## 2021-05-26 LAB — CBC/D/PLT+RPR+RH+ABO+RUBIGG...
Antibody Screen: NEGATIVE
Basophils Absolute: 0 10*3/uL (ref 0.0–0.2)
Basos: 0 %
EOS (ABSOLUTE): 0.1 10*3/uL (ref 0.0–0.4)
Eos: 1 %
HCV Ab: <0.1 s/co ratio (ref 0.0–0.9)
HIV Screen 4th Generation wRfx: NONREACTIVE
Hematocrit: 30.6 % — ABNORMAL LOW (ref 34.0–46.6)
Hemoglobin: 10.3 g/dL — ABNORMAL LOW (ref 11.1–15.9)
Hepatitis B Surface Ag: NEGATIVE
Immature Grans (Abs): 0.1 10*3/uL (ref 0.0–0.1)
Immature Granulocytes: 1 %
Lymphocytes Absolute: 3.3 10*3/uL — ABNORMAL HIGH (ref 0.7–3.1)
Lymphs: 32 %
MCH: 29.3 pg (ref 26.6–33.0)
MCHC: 33.7 g/dL (ref 31.5–35.7)
MCV: 87 fL (ref 79–97)
Monocytes Absolute: 0.5 10*3/uL (ref 0.1–0.9)
Monocytes: 5 %
Neutrophils Absolute: 6.5 10*3/uL (ref 1.4–7.0)
Neutrophils: 61 %
Platelets: 336 10*3/uL (ref 150–450)
RBC: 3.51 x10E6/uL — ABNORMAL LOW (ref 3.77–5.28)
RDW: 14.3 % (ref 11.7–15.4)
RPR Ser Ql: NONREACTIVE
Rh Factor: POSITIVE
Rubella Antibodies, IGG: 1.61 index (ref >0.99)
WBC: 10.4 10*3/uL (ref 3.4–10.8)

## 2021-05-26 LAB — HEMOGLOBIN A1C
Est. average glucose Bld gHb Est-mCnc: 126 mg/dL
Hgb A1c MFr Bld: 6 % — ABNORMAL HIGH (ref 4.8–5.6)

## 2021-05-26 LAB — CYTOLOGY - PAP
Chlamydia: NEGATIVE
Comment: NEGATIVE
Comment: NEGATIVE
Comment: NEGATIVE
Comment: NORMAL
Diagnosis: NEGATIVE
High risk HPV: NEGATIVE
Neisseria Gonorrhea: NEGATIVE
Trichomonas: NEGATIVE

## 2021-05-26 LAB — HCV INTERPRETATION

## 2021-05-27 LAB — CULTURE, OB URINE

## 2021-05-27 LAB — URINE CULTURE, OB REFLEX

## 2021-06-01 DIAGNOSIS — O99019 Anemia complicating pregnancy, unspecified trimester: Secondary | ICD-10-CM | POA: Insufficient documentation

## 2021-06-01 DIAGNOSIS — O99012 Anemia complicating pregnancy, second trimester: Secondary | ICD-10-CM | POA: Insufficient documentation

## 2021-06-01 DIAGNOSIS — O99013 Anemia complicating pregnancy, third trimester: Secondary | ICD-10-CM | POA: Insufficient documentation

## 2021-06-01 HISTORY — DX: Anemia complicating pregnancy, second trimester: O99.012

## 2021-06-13 ENCOUNTER — Encounter (HOSPITAL_COMMUNITY): Payer: Self-pay | Admitting: Obstetrics and Gynecology

## 2021-06-13 ENCOUNTER — Inpatient Hospital Stay (HOSPITAL_COMMUNITY)
Admission: AD | Admit: 2021-06-13 | Discharge: 2021-06-13 | Disposition: A | Discharge status: Home / Self Care | Payer: Medicaid Other | Attending: Obstetrics and Gynecology | Admitting: Obstetrics and Gynecology

## 2021-06-13 ENCOUNTER — Inpatient Hospital Stay (HOSPITAL_COMMUNITY): Payer: Medicaid Other

## 2021-06-13 DIAGNOSIS — O4692 Antepartum hemorrhage, unspecified, second trimester: Secondary | ICD-10-CM | POA: Insufficient documentation

## 2021-06-13 DIAGNOSIS — R109 Unspecified abdominal pain: Secondary | ICD-10-CM | POA: Insufficient documentation

## 2021-06-13 DIAGNOSIS — O26892 Other specified pregnancy related conditions, second trimester: Secondary | ICD-10-CM | POA: Diagnosis not present

## 2021-06-13 DIAGNOSIS — Z3A14 14 weeks gestation of pregnancy: Secondary | ICD-10-CM | POA: Diagnosis not present

## 2021-06-13 DIAGNOSIS — Z3480 Encounter for supervision of other normal pregnancy, unspecified trimester: Secondary | ICD-10-CM

## 2021-06-13 LAB — URINALYSIS, ROUTINE W REFLEX MICROSCOPIC
Bilirubin Urine: NEGATIVE
Glucose, UA: NEGATIVE mg/dL
Ketones, ur: NEGATIVE mg/dL
Leukocytes,Ua: NEGATIVE
Nitrite: NEGATIVE
Protein, ur: NEGATIVE mg/dL
Specific Gravity, Urine: 1.02 (ref 1.005–1.030)
pH: 6 (ref 5.0–8.0)

## 2021-06-13 LAB — URINALYSIS, MICROSCOPIC (REFLEX)

## 2021-06-13 NOTE — Progress Notes (Signed)
Written and verbal d/c instructions given and understanding voiced. 

## 2021-06-13 NOTE — MAU Note (Signed)
I started bleeding about ago. I had a sharp pain in lower abdomen right before bleeding started but no pain since then.

## 2021-06-13 NOTE — MAU Provider Note (Signed)
Chief Complaint: Vaginal Bleeding   Event Date/Time   First Provider Initiated Contact with Patient 06/13/21 2221        SUBJECTIVE HPI: Briana Lynch is a 25 y.o. G1P0 at [redacted]w[redacted]d by LMP who presents to maternity admissions reporting seeing small drop of blood yesterday and then some more blood tonight.  Had one sharp pain but no constant cramping. . She denies vaginal itching/burning, urinary symptoms, h/a, dizziness, n/v, or fever/chills.   Has not had any Ultrasounds yet, except a bedside one at new OB  Vaginal Bleeding The patient's primary symptoms include vaginal bleeding. The patient's pertinent negatives include no genital itching, genital lesions, genital odor or pelvic pain (only one sharp pain once). This is a new problem. The current episode started yesterday. The problem occurs intermittently. The problem has been gradually improving. She is pregnant. Associated symptoms include abdominal pain (one sharp pain once, now none). Pertinent negatives include no chills, constipation, diarrhea, dysuria, fever, frequency, nausea or vomiting. The vaginal discharge was bloody. The vaginal bleeding is lighter than menses. She has not been passing clots. She has not been passing tissue. Nothing aggravates the symptoms. She has tried nothing for the symptoms.  RN Note: I started bleeding about ago. I had a sharp pain in lower abdomen right before bleeding started but no pain since then  Past Medical History:  Diagnosis Date   Ankle sprain and strain 11/15/2010   Anxiety state 12/09/2012   Depression    PCOS (polycystic ovarian syndrome)    Past Surgical History:  Procedure Laterality Date   ESOPHAGOGASTRODUODENOSCOPY (EGD) WITH PROPOFOL N/A 10/24/2020   Procedure: ESOPHAGOGASTRODUODENOSCOPY (EGD) WITH PROPOFOL;  Surgeon: Beverley Fiedler, MD;  Location: [facility] ENDOSCOPY;  Service: Gastroenterology;  Laterality: N/A;   Social History   Socioeconomic History   Marital status: Single     Spouse name: Not on file   Number of children: Not on file   Years of education: Not on file   Highest education level: 12th grade  Occupational History   Not on file  Tobacco Use   Smoking status: Every Day    Packs/day: 0.25    Types: Cigarettes   Smokeless tobacco: Never   Tobacco comments:    Dad smokes  Vaping Use   Vaping Use: Never used  Substance and Sexual Activity   Alcohol use: Not Currently    Alcohol/week: 0.0 standard drinks   Drug use: Yes    Types: Marijuana    Comment: infrequent   Sexual activity: Not Currently    Birth control/protection: None  Other Topics Concern   Not on file  Social History Narrative   2014:Sophomore in high school, taking AP classes, enjoys school manages basketball, football and track teams   Social Determinants of Health   Financial Resource Strain: Not on file  Food Insecurity: Not on file  Transportation Needs: Not on file  Physical Activity: Not on file  Stress: Not on file  Social Connections: Not on file  Intimate Partner Violence: Not on file   No current facility-administered medications on file prior to encounter.   Current Outpatient Medications on File Prior to Encounter  Medication Sig Dispense Refill   aspirin EC 81 MG tablet Take 1 tablet (81 mg total) by mouth daily. Take after 12 weeks for prevention of preeclampsia later in pregnancy 300 tablet 2   Blood Pressure Monitoring DEVI 1 each by Does not apply route once a week. 1 each 0   Doxylamine-Pyridoxine (DICLEGIS)  10-10 MG TBEC Take 2 tablets at bedtime and one in the morning and one in the afternoon as needed for nausea. (Patient not taking: Reported on 05/25/2021) 60 tablet 2   Prenatal MV & Min w/FA-DHA (PRENATAL GUMMIES) 0.18-25 MG CHEW Chew by mouth.     promethazine (PHENERGAN) 25 MG tablet Take 1 tablet (25 mg total) by mouth every 6 (six) hours as needed for nausea or vomiting. Can take 1/2 tablet for milder symptoms. (Patient not taking: Reported on  05/25/2021) 30 tablet 1   Allergies  Allergen Reactions   Penicillins Other (See Comments)    Did it involve swelling of the face/tongue/throat, SOB, or low BP? No Did it involve sudden or severe rash/hives, skin peeling, or any reaction on the inside of your mouth or nose? No Did you need to seek medical attention at a hospital or doctor's office? No When did it last happen? Never      If all above answers are "NO", may proceed with cephalosporin use.  Unknown-pt's sister is allergic    I have reviewed patient's Past Medical Hx, Surgical Hx, Family Hx, Social Hx, medications and allergies.   ROS:  Review of Systems  Constitutional:  Negative for chills and fever.  Gastrointestinal:  Positive for abdominal pain (one sharp pain once, now none). Negative for constipation, diarrhea, nausea and vomiting.  Genitourinary:  Positive for vaginal bleeding. Negative for dysuria, frequency and pelvic pain (only one sharp pain once).  Review of Systems  Other systems negative   Physical Exam  Physical Exam Patient Vitals for the past 24 hrs:  BP Temp Pulse Resp SpO2 Height Weight  06/13/21 2028 124/74 98.4 F (36.9 C) 88 17 100 % 5\' 8"  (1.727 m) 119.7 kg   Constitutional: Well-developed, well-nourished female in no acute distress.  Cardiovascular: normal rate Respiratory: normal effort GI: Abd soft, non-tender.  MS: Extremities nontender, no edema, normal ROM Neurologic: Alert and oriented x 4.  GU: Neg CVAT.  PELVIC EXAM: Cervix pink, visually closed, without lesion, scant bloody discharge, vaginal walls and external genitalia normal Bimanual exam: Cervix 0/long/high, firm, anterior, neg CMT, uterus nontender, adnexa without tenderness, enlargement, or mass   LAB RESULTS Results for orders placed or performed during the hospital encounter of 06/13/21 (from the past 24 hour(s))  Urinalysis, Routine w reflex microscopic Urine, Clean Catch     Status: Abnormal   Collection Time:  06/13/21  9:14 PM  Result Value Ref Range   Color, Urine YELLOW YELLOW   APPearance CLEAR CLEAR   Specific Gravity, Urine 1.020 1.005 - 1.030   pH 6.0 5.0 - 8.0   Glucose, UA NEGATIVE NEGATIVE mg/dL   Hgb urine dipstick LARGE (A) NEGATIVE   Bilirubin Urine NEGATIVE NEGATIVE   Ketones, ur NEGATIVE NEGATIVE mg/dL   Protein, ur NEGATIVE NEGATIVE mg/dL   Nitrite NEGATIVE NEGATIVE   Leukocytes,Ua NEGATIVE NEGATIVE  Urinalysis, Microscopic (reflex)     Status: Abnormal   Collection Time: 06/13/21  9:14 PM  Result Value Ref Range   RBC / HPF 0-5 0 - 5 RBC/hpf   WBC, UA 0-5 0 - 5 WBC/hpf   Bacteria, UA RARE (A) NONE SEEN   Squamous Epithelial / LPF 0-5 0 - 5   Mucus PRESENT     O/Positive/-- (11/10 1651)  IMAGING 08-28-2004 OB Comp Less 14 Wks  Result Date: 06/13/2021 CLINICAL DATA:  Bleeding in early pregnancy. EXAM: OBSTETRIC <14 WK ULTRASOUND TECHNIQUE: Transabdominal ultrasound was performed for evaluation of the gestation  as well as the maternal uterus and adnexal regions. COMPARISON:  None. FINDINGS: Intrauterine gestational sac: Single Yolk sac:  Not Visualized. Embryo:  Visualized. Cardiac Activity: Visualized. Heart Rate: 157 bpm CRL:   8.44 cm mm   14 w 2 d                  [place] EDC: 12/10/2021 Subchorionic hemorrhage:  None visualized. Maternal uterus/adnexae: Bilateral ovaries within normal limits. No free fluid. IMPRESSION: 1. Single live intrauterine gestation measuring 14 weeks 2 days by crown-rump length. No acute abnormality. Electronically Signed   By: Darliss Cheney M.D.   On: 06/13/2021 21:21    MAU Management/MDM: Ordered UA to assess for UTI, negative.   Pelvic exam and cultures done Will check baseline Ultrasound to rule out previa or large hematoma.  This bleeding/pain can represent a normal pregnancy with bleeding, spontaneous abortion or even an ectopic which can be life-threatening.  The process as listed above helps to determine which of these is present.  US showed  live single IUP at [redacted]w[redacted]d with no obvious subchorionic hematoma noted Reviewed with patient.  It is unclear why she is bleeding. Recommend pelvic rest until 2 weeks with no bleeding Patient to return if bleeding becomes heavy or accompanied by severe pain   ASSESSMENT Single IUP at [redacted]w[redacted]d Vaginal bleeding in second trimester, unclear source  PLAN Discharge home Pelvic rest Bleeding precautions  Pt stable at time of discharge. Encouraged to return here if she develops worsening of symptoms, increase in pain, fever, or other concerning symptoms.    Wynelle Bourgeois CNM, MSN Certified Nurse-Midwife 06/13/2021  10:21 PM

## 2021-06-16 ENCOUNTER — Encounter: Payer: Self-pay | Admitting: Family Medicine

## 2021-06-16 ENCOUNTER — Telehealth: Payer: Self-pay | Admitting: Lactation Services

## 2021-06-16 DIAGNOSIS — D563 Thalassemia minor: Secondary | ICD-10-CM | POA: Insufficient documentation

## 2021-06-16 NOTE — Telephone Encounter (Signed)
Called patient to inform her of [facility] Screening Results showing she is a silent carrier for Alpha Thalassemia.   Reviewed with patient that is is recommended that she call [facility] at [phone] to set up a Telephone Genetic Counseling Session to discuss results.   Reviewed it is recommended that FOB be tested and that we have saliva kits in the office that she can pick up at her next visit to take home for father to completed and mail back.   Patient with no questions or concerns. Patient to call back with any questions as needed.

## 2021-06-27 ENCOUNTER — Other Ambulatory Visit: Payer: Self-pay

## 2021-06-27 ENCOUNTER — Ambulatory Visit (INDEPENDENT_AMBULATORY_CARE_PROVIDER_SITE_OTHER): Payer: Medicaid Other | Admitting: Family Medicine

## 2021-06-27 VITALS — BP 97/61 | HR 104 | Wt 258.8 lb

## 2021-06-27 DIAGNOSIS — Z3480 Encounter for supervision of other normal pregnancy, unspecified trimester: Secondary | ICD-10-CM

## 2021-06-27 DIAGNOSIS — D563 Thalassemia minor: Secondary | ICD-10-CM

## 2021-06-27 DIAGNOSIS — O99019 Anemia complicating pregnancy, unspecified trimester: Secondary | ICD-10-CM

## 2021-06-27 DIAGNOSIS — O99330 Smoking (tobacco) complicating pregnancy, unspecified trimester: Secondary | ICD-10-CM

## 2021-06-27 DIAGNOSIS — O99211 Obesity complicating pregnancy, first trimester: Secondary | ICD-10-CM

## 2021-06-27 NOTE — Progress Notes (Signed)
° °  Subjective:  Briana Lynch is a 25 y.o. G1P0 at [redacted]w[redacted]d being seen today for ongoing prenatal care.  She is currently monitored for the following issues for this low-risk pregnancy and has Urinary incontinence; PCOS (polycystic ovarian syndrome); Vitamin D deficiency; Prediabetes; Iron deficiency anemia; HSV-2 (herpes simplex virus 2) infection; Suicide attempt (HCC); Caustic injury gastritis; Supervision of other normal pregnancy, antepartum; Obesity affecting pregnancy; Tobacco smoking affecting pregnancy; Anemia affecting pregnancy; and Alpha thalassemia silent carrier on their problem list.  Patient reports no complaints.  Contractions: Not present. Vag. Bleeding: None.  Movement: Present. Denies leaking of fluid.   The following portions of the patient's history were reviewed and updated as appropriate: allergies, current medications, past family history, past medical history, past social history, past surgical history and problem list. Problem list updated.  Objective:   Vitals:   06/27/21 1108  BP: 97/61  Pulse: (!) 104  Weight: 258 lb 12.8 oz (117.4 kg)    Fetal Status: Fetal Heart Rate (bpm): 156   Movement: Present     General:  Alert, oriented and cooperative. Patient is in no acute distress.  Skin: Skin is warm and dry. No rash noted.   Cardiovascular: Normal heart rate noted  Respiratory: Normal respiratory effort, no problems with respiration noted  Abdomen: Soft, gravid, appropriate for gestational age. Pain/Pressure: Absent     Pelvic: Vag. Bleeding: None     Cervical exam deferred        Extremities: Normal range of motion.  Edema: None  Mental Status: Normal mood and affect. Normal behavior. Normal judgment and thought content.   Urinalysis:      Assessment and Plan:  Pregnancy: G1P0 at [redacted]w[redacted]d  1. Anemia affecting pregnancy, antepartum Hgb 10.3 on new OB labs, start PO iron if <10  2. Obesity affecting pregnancy in first trimester   3. Tobacco smoking  affecting pregnancy, antepartum 3-4 cig/day Encouraged to quit  4. Supervision of other normal pregnancy, antepartum BP and FHR normal AFP today Reviewed abnormal screening A1c, will obtain early 2hr GTT - AFP, Serum, Open Spina Bifida  5. Alpha thalassemia silent carrier Partner with known sickle trait Declines further partner testing  Preterm labor symptoms and general obstetric precautions including but not limited to vaginal bleeding, contractions, leaking of fluid and fetal movement were reviewed in detail with the patient. Please refer to After Visit Summary for other counseling recommendations.  Return for Dyad patient, ob visit.   Venora Maples, MD

## 2021-06-29 ENCOUNTER — Other Ambulatory Visit: Payer: Self-pay | Admitting: *Deleted

## 2021-06-29 DIAGNOSIS — O99810 Abnormal glucose complicating pregnancy: Secondary | ICD-10-CM

## 2021-06-30 LAB — AFP, SERUM, OPEN SPINA BIFIDA
AFP MoM: 0.59
AFP Value: 15.8 ng/mL
Gest. Age on Collection Date: 15.7 weeks
Maternal Age At EDD: 24.9 yr
OSBR Risk 1 IN: 10000
Test Results:: NEGATIVE
Weight: 258 [lb_av]

## 2021-07-04 ENCOUNTER — Other Ambulatory Visit: Payer: Medicaid Other

## 2021-07-04 ENCOUNTER — Other Ambulatory Visit: Payer: Self-pay

## 2021-07-04 DIAGNOSIS — O99810 Abnormal glucose complicating pregnancy: Secondary | ICD-10-CM

## 2021-07-05 LAB — GLUCOSE TOLERANCE, 2 HOURS W/ 1HR
Glucose, 1 hour: 109 mg/dL (ref 70–179)
Glucose, 2 hour: 54 mg/dL — ABNORMAL LOW (ref 70–152)
Glucose, Fasting: 86 mg/dL (ref 70–91)

## 2021-07-16 NOTE — L&D Delivery Note (Signed)
OB/GYN Faculty Practice Delivery Note  Briana Lynch is a 26 y.o. G1P0 s/p SVD at [redacted]w[redacted]d. She was admitted for preterm labor, incompetent cervix, and bulging membranes.   ROM: 0h 67m with clear fluid GBS Status: unkno Maximum Maternal Temperature: 98.8  Labor Progress: Presented with pressure, was found to be 2-3 cm with bulging bag on speculum exam. She was started on Magnesium for tocolysis and neuroprotection.  She also received BMZ at 1400 on 2/2 and a second dose 12 hours later. She had an [place] that showed her cervix was fully dilated and ultimately she started having more painful and frequent contractions and had a bulging bag in her vagina. Patient pushed and her membrane ruptured  Delivery Date/Time: 0545 on 08/18/2021 Delivery: Called to room and patient was complete and pushing. Head delivered OP. No nuchal cord present. Shoulder and body delivered in usual fashion. Infant dried and stimulated and cord clamped x 2 immediately and cut by Dr. Ephriam Jenkins. Cord blood drawn. Placenta delivered spontaneously with gentle cord traction. Fundus firm with massage and Pitocin. Labia, perineum, vagina, and cervix inspected and found to be intact.  Placenta: Intact, 3V cord, Pathology Complications: preterm delivery Lacerations: none EBL: 275cc Analgesia: none    Infant: female   APGARs 6,8   570g  Warner Mccreedy, MD, MPH OB Fellow, Faculty Practice Center for [facility], [facility] 08/18/2021, 6:42 AM

## 2021-07-18 ENCOUNTER — Ambulatory Visit: Payer: Commercial Managed Care - PPO | Attending: Family Medicine

## 2021-07-18 ENCOUNTER — Other Ambulatory Visit: Payer: Self-pay | Admitting: Family Medicine

## 2021-07-18 ENCOUNTER — Other Ambulatory Visit: Payer: Self-pay | Admitting: *Deleted

## 2021-07-18 ENCOUNTER — Telehealth: Payer: Self-pay | Admitting: Family Medicine

## 2021-07-18 ENCOUNTER — Other Ambulatory Visit: Payer: Self-pay

## 2021-07-18 DIAGNOSIS — Z3480 Encounter for supervision of other normal pregnancy, unspecified trimester: Secondary | ICD-10-CM | POA: Diagnosis present

## 2021-07-18 DIAGNOSIS — Z3A18 18 weeks gestation of pregnancy: Secondary | ICD-10-CM | POA: Insufficient documentation

## 2021-07-18 DIAGNOSIS — Z148 Genetic carrier of other disease: Secondary | ICD-10-CM

## 2021-07-18 DIAGNOSIS — Z363 Encounter for antenatal screening for malformations: Secondary | ICD-10-CM | POA: Diagnosis not present

## 2021-07-18 DIAGNOSIS — O99332 Smoking (tobacco) complicating pregnancy, second trimester: Secondary | ICD-10-CM | POA: Diagnosis not present

## 2021-07-18 DIAGNOSIS — O99212 Obesity complicating pregnancy, second trimester: Secondary | ICD-10-CM

## 2021-07-18 DIAGNOSIS — Z362 Encounter for other antenatal screening follow-up: Secondary | ICD-10-CM

## 2021-07-18 NOTE — Telephone Encounter (Signed)
Patient came to the front desk following her ultrasound appointment and wanted to know the results of her 2 hour glucose test. I informed her I would send an inbasket message to the nurses to see if someone could give her a call about those results.

## 2021-07-19 NOTE — Telephone Encounter (Signed)
Called patient & informed her of normal results. Patient verbalized understanding & had no other questions.

## 2021-07-26 ENCOUNTER — Other Ambulatory Visit: Payer: Self-pay

## 2021-07-26 ENCOUNTER — Ambulatory Visit (INDEPENDENT_AMBULATORY_CARE_PROVIDER_SITE_OTHER): Payer: Commercial Managed Care - PPO | Admitting: Family Medicine

## 2021-07-26 VITALS — BP 109/71 | HR 87 | Wt 266.4 lb

## 2021-07-26 DIAGNOSIS — Z3480 Encounter for supervision of other normal pregnancy, unspecified trimester: Secondary | ICD-10-CM

## 2021-07-26 DIAGNOSIS — O99330 Smoking (tobacco) complicating pregnancy, unspecified trimester: Secondary | ICD-10-CM

## 2021-07-26 DIAGNOSIS — D563 Thalassemia minor: Secondary | ICD-10-CM

## 2021-07-26 DIAGNOSIS — O99019 Anemia complicating pregnancy, unspecified trimester: Secondary | ICD-10-CM

## 2021-07-26 DIAGNOSIS — O99212 Obesity complicating pregnancy, second trimester: Secondary | ICD-10-CM

## 2021-07-26 NOTE — Progress Notes (Signed)
° °  PRENATAL VISIT NOTE  Subjective:  Briana Lynch is a 26 y.o. G1P0 at [redacted]w[redacted]d being seen today for ongoing prenatal care.  She is currently monitored for the following issues for this low-risk pregnancy and has Urinary incontinence; PCOS (polycystic ovarian syndrome); Vitamin D deficiency; Prediabetes; Iron deficiency anemia; HSV-2 (herpes simplex virus 2) infection; Suicide attempt (HCC); Caustic injury gastritis; Supervision of other normal pregnancy, antepartum; Obesity affecting pregnancy; Tobacco smoking affecting pregnancy; Anemia affecting pregnancy; and Alpha thalassemia silent carrier on their problem list.  Patient reports no complaints.  Contractions: Not present. Vag. Bleeding: None.  Movement: Present. Denies leaking of fluid.   The following portions of the patient's history were reviewed and updated as appropriate: allergies, current medications, past family history, past medical history, past social history, past surgical history and problem list.   Objective:   Vitals:   07/26/21 1519  BP: 109/71  Pulse: 87  Weight: 266 lb 6.4 oz (120.8 kg)    Fetal Status: Fetal Heart Rate (bpm): 150   Movement: Present     General:  Alert, oriented and cooperative. Patient is in no acute distress.  Skin: Skin is warm and dry. No rash noted.   Cardiovascular: Normal heart rate noted  Respiratory: Normal respiratory effort, no problems with respiration noted  Abdomen: Soft, gravid, appropriate for gestational age.  Pain/Pressure: Absent     Pelvic: Cervical exam deferred        Extremities: Normal range of motion.  Edema: None  Mental Status: Normal mood and affect. Normal behavior. Normal judgment and thought content.   Assessment and Plan:  Pregnancy: G1P0 at [redacted]w[redacted]d  1. Anemia affecting pregnancy, antepartum Mild (10.3) encouraged higher iron in food  2. Obesity affecting pregnancy in second trimester TWG=16 lb 6.4 oz (7.439 kg)   3. Tobacco smoking affecting pregnancy,  antepartum Monitor Continues to smoke 1/4 ppd. Used motivational interviewing to discuss barriers to tobacco cessation. Spent 3-10 minutes in direct counseling about ways to reduce use with the goal of eventual cessation. Reviewed health concerns for pregnancy and infant related to tobacco use.  Discussed gradual reduction and replacement of cigs with other activities.  Patient stated goal: do not escalate/decrease. Father smokes   4. Supervision of other normal pregnancy, antepartum UTD Has fu [place] scheduled  5. Alpha thalassemia silent carrier Declines partner testing  Preterm labor symptoms and general obstetric precautions including but not limited to vaginal bleeding, contractions, leaking of fluid and fetal movement were reviewed in detail with the patient. Please refer to After Visit Summary for other counseling recommendations.   Return for scheduled visit.  Future Appointments  Date Time Provider Department Center  08/15/2021  2:30 PM [facility] NURSE [facility]  08/15/2021  2:45 PM [facility] US5 [facility]  08/23/2021  3:35 PM [facility]  09/19/2021  8:15 AM [facility]  10/04/2021  1:15 PM [facility]    Federico Flake, MD

## 2021-08-02 ENCOUNTER — Encounter: Payer: Self-pay | Admitting: Family Medicine

## 2021-08-09 ENCOUNTER — Inpatient Hospital Stay (HOSPITAL_COMMUNITY)
Admission: AD | Admit: 2021-08-09 | Discharge: 2021-08-09 | Disposition: A | Discharge status: Home / Self Care | Payer: Commercial Managed Care - PPO | Attending: Obstetrics and Gynecology | Admitting: Obstetrics and Gynecology

## 2021-08-09 ENCOUNTER — Other Ambulatory Visit: Payer: Self-pay

## 2021-08-09 ENCOUNTER — Encounter (HOSPITAL_COMMUNITY): Payer: Self-pay | Admitting: Obstetrics and Gynecology

## 2021-08-09 DIAGNOSIS — N898 Other specified noninflammatory disorders of vagina: Secondary | ICD-10-CM | POA: Diagnosis not present

## 2021-08-09 DIAGNOSIS — Z3480 Encounter for supervision of other normal pregnancy, unspecified trimester: Secondary | ICD-10-CM

## 2021-08-09 DIAGNOSIS — O26892 Other specified pregnancy related conditions, second trimester: Secondary | ICD-10-CM | POA: Diagnosis present

## 2021-08-09 DIAGNOSIS — D563 Thalassemia minor: Secondary | ICD-10-CM

## 2021-08-09 DIAGNOSIS — Z3A21 21 weeks gestation of pregnancy: Secondary | ICD-10-CM | POA: Insufficient documentation

## 2021-08-09 LAB — WET PREP, GENITAL
Clue Cells Wet Prep HPF POC: NONE SEEN
Sperm: NONE SEEN
Trich, Wet Prep: NONE SEEN
WBC, Wet Prep HPF POC: <10 (ref <10)
Yeast Wet Prep HPF POC: NONE SEEN

## 2021-08-09 LAB — URINALYSIS, ROUTINE W REFLEX MICROSCOPIC
Bilirubin Urine: NEGATIVE
Glucose, UA: NEGATIVE mg/dL
Hgb urine dipstick: NEGATIVE
Ketones, ur: NEGATIVE mg/dL
Leukocytes,Ua: NEGATIVE
Nitrite: NEGATIVE
Protein, ur: NEGATIVE mg/dL
Specific Gravity, Urine: 1.004 — ABNORMAL LOW (ref 1.005–1.030)
pH: 7 (ref 5.0–8.0)

## 2021-08-09 NOTE — MAU Provider Note (Signed)
Chief Complaint:  Vaginal Discharge   Event Date/Time   First Provider Initiated Contact with Patient 08/09/21 0105     HPI: Briana Lynch is a 26 y.o. G1P0 at 54w6dwho presents to maternity admissions reporting mucous discharge she thinks is her mucous plug.  Just happened tonight so came in.  Had 2 episodes of painless contractions. . She reports good fetal movement, denies LOF, vaginal bleeding, vaginal itching/burning, urinary symptoms, h/a, dizziness, n/v, diarrhea, constipation or fever/chills.    Vaginal Discharge The patient's primary symptoms include vaginal discharge. The patient's pertinent negatives include no genital itching, genital lesions, genital odor, pelvic pain or vaginal bleeding. This is a new problem. The current episode started today. The patient is experiencing no pain. She is pregnant. Pertinent negatives include no abdominal pain, chills, fever, nausea or vomiting. The vaginal discharge was mucoid and thick. There has been no bleeding. She has not been passing clots. She has not been passing tissue. Nothing aggravates the symptoms.   RN Note: Yesterday saw small amt of mucousy d/c. Tonight had a lot more clear, thick d/c like jello. No pain. States over wkend stomach was tightening some.  Past Medical History: Past Medical History:  Diagnosis Date   Ankle sprain and strain 11/15/2010   Anxiety state 12/09/2012   Depression    PCOS (polycystic ovarian syndrome)     Past obstetric history: OB History  Gravida Para Term Preterm AB Living  1            SAB IAB Ectopic Multiple Live Births               # Outcome Date GA Lbr Len/2nd Weight Sex Delivery Anes PTL Lv  1 Current             Past Surgical History: Past Surgical History:  Procedure Laterality Date   ESOPHAGOGASTRODUODENOSCOPY (EGD) WITH PROPOFOL N/A 10/24/2020   Procedure: ESOPHAGOGASTRODUODENOSCOPY (EGD) WITH PROPOFOL;  Surgeon: Beverley Fiedler, MD;  Location: [facility] ENDOSCOPY;  Service:  Gastroenterology;  Laterality: N/A;    Family History: Family History  Problem Relation Age of Onset   Diabetes Mother    Hypertension Mother    Mental illness Sister     Social History: Social History   Tobacco Use   Smoking status: Every Day    Packs/day: 0.25    Types: Cigarettes   Smokeless tobacco: Never   Tobacco comments:    Dad smokes  Vaping Use   Vaping Use: Never used  Substance Use Topics   Alcohol use: Not Currently    Alcohol/week: 0.0 standard drinks   Drug use: Yes    Types: Marijuana    Comment: infrequent    Allergies:  Allergies  Allergen Reactions   Penicillins Other (See Comments)    Did it involve swelling of the face/tongue/throat, SOB, or low BP? No Did it involve sudden or severe rash/hives, skin peeling, or any reaction on the inside of your mouth or nose? No Did you need to seek medical attention at a hospital or doctor's office? No When did it last happen? Never      If all above answers are NO, may proceed with cephalosporin use.  Unknown-pt's sister is allergic    Meds:  Medications Prior to Admission  Medication Sig Dispense Refill Last Dose   aspirin EC 81 MG tablet Take 1 tablet (81 mg total) by mouth daily. Take after 12 weeks for prevention of preeclampsia later in pregnancy 300 tablet 2  Blood Pressure Monitoring DEVI 1 each by Does not apply route once a week. 1 each 0    Prenatal MV & Min w/FA-DHA (PRENATAL GUMMIES) 0.18-25 MG CHEW Chew by mouth.       I have reviewed patient's Past Medical Hx, Surgical Hx, Family Hx, Social Hx, medications and allergies.   ROS:  Review of Systems  Constitutional:  Negative for chills and fever.  Gastrointestinal:  Negative for abdominal pain, nausea and vomiting.  Genitourinary:  Positive for vaginal discharge. Negative for pelvic pain.  Other systems negative  Physical Exam  Patient Vitals for the past 24 hrs:  BP Temp Pulse Resp SpO2 Height Weight  08/09/21 0051 121/74 -- --  -- -- -- --  08/09/21 0050 -- 97.7 F (36.5 C) 89 17 100 % 5\' 8"  (1.727 m) 123.8 kg   Constitutional: Well-developed, well-nourished female in no acute distress.  Cardiovascular: normal rate and rhythm Respiratory: normal effort, clear to auscultation bilaterally GI: Abd soft, non-tender, gravid appropriate for gestational age.   No rebound or guarding. MS: Extremities nontender, no edema, normal ROM Neurologic: Alert and oriented x 4.  GU: Neg CVAT.  PELVIC EXAM: Cervix pink, visually closed, without lesion, scant white creamy discharge, vaginal walls and external genitalia normal  FHT: 147    Labs: Results for orders placed or performed during the hospital encounter of 08/09/21 (from the past 24 hour(s))  Wet prep, genital     Status: None   Collection Time: 08/09/21  1:21 AM  Result Value Ref Range   Yeast Wet Prep HPF POC NONE SEEN NONE SEEN   Trich, Wet Prep NONE SEEN NONE SEEN   Clue Cells Wet Prep HPF POC NONE SEEN NONE SEEN   WBC, Wet Prep HPF POC <10 <10   Sperm NONE SEEN   Urinalysis, Routine w reflex microscopic     Status: Abnormal   Collection Time: 08/09/21  1:26 AM  Result Value Ref Range   Color, Urine STRAW (A) YELLOW   APPearance CLEAR CLEAR   Specific Gravity, Urine 1.004 (L) 1.005 - 1.030   pH 7.0 5.0 - 8.0   Glucose, UA NEGATIVE NEGATIVE mg/dL   Hgb urine dipstick NEGATIVE NEGATIVE   Bilirubin Urine NEGATIVE NEGATIVE   Ketones, ur NEGATIVE NEGATIVE mg/dL   Protein, ur NEGATIVE NEGATIVE mg/dL   Nitrite NEGATIVE NEGATIVE   Leukocytes,Ua NEGATIVE NEGATIVE    O/Positive/-- (11/10 1651)  Imaging:    MAU Course/MDM: I have ordered labs and reviewed results. Wet prep and UA normal   Treatments in MAU included SSE  Discussed mucous discharge can be normal in pregnancy .  Cervix is closed. No evidence of mucous plug being passed.    Assessment: Single IUP at [redacted]w[redacted]d Mucous discharge  Plan: Discharge home Preterm Labor precautions and fetal kick  counts Follow up in Office for prenatal visits  Encouraged to return if she develops worsening of symptoms, increase in pain, fever, or other concerning symptoms.  Pt stable at time of discharge.  [redacted]w[redacted]d CNM, MSN Certified Nurse-Midwife 08/09/2021 1:05 AM

## 2021-08-09 NOTE — MAU Note (Signed)
Yesterday saw small amt of mucousy d/c. Tonight had a lot more clear, thick d/c like jello. No pain. States over wkend stomach was tightening some.

## 2021-08-15 ENCOUNTER — Ambulatory Visit: Payer: Commercial Managed Care - PPO | Admitting: *Deleted

## 2021-08-15 ENCOUNTER — Other Ambulatory Visit: Payer: Self-pay

## 2021-08-15 ENCOUNTER — Ambulatory Visit (HOSPITAL_BASED_OUTPATIENT_CLINIC_OR_DEPARTMENT_OTHER): Payer: Commercial Managed Care - PPO

## 2021-08-15 VITALS — BP 102/62 | HR 101

## 2021-08-15 DIAGNOSIS — O99212 Obesity complicating pregnancy, second trimester: Secondary | ICD-10-CM | POA: Insufficient documentation

## 2021-08-15 DIAGNOSIS — O99019 Anemia complicating pregnancy, unspecified trimester: Secondary | ICD-10-CM

## 2021-08-15 DIAGNOSIS — O99332 Smoking (tobacco) complicating pregnancy, second trimester: Secondary | ICD-10-CM | POA: Insufficient documentation

## 2021-08-15 DIAGNOSIS — O99342 Other mental disorders complicating pregnancy, second trimester: Secondary | ICD-10-CM | POA: Insufficient documentation

## 2021-08-15 DIAGNOSIS — O99330 Smoking (tobacco) complicating pregnancy, unspecified trimester: Secondary | ICD-10-CM

## 2021-08-15 DIAGNOSIS — O99210 Obesity complicating pregnancy, unspecified trimester: Secondary | ICD-10-CM | POA: Insufficient documentation

## 2021-08-15 DIAGNOSIS — E668 Other obesity: Secondary | ICD-10-CM

## 2021-08-15 DIAGNOSIS — D563 Thalassemia minor: Secondary | ICD-10-CM | POA: Insufficient documentation

## 2021-08-15 DIAGNOSIS — Z3A22 22 weeks gestation of pregnancy: Secondary | ICD-10-CM | POA: Insufficient documentation

## 2021-08-15 DIAGNOSIS — O99012 Anemia complicating pregnancy, second trimester: Secondary | ICD-10-CM | POA: Insufficient documentation

## 2021-08-15 DIAGNOSIS — Z362 Encounter for other antenatal screening follow-up: Secondary | ICD-10-CM

## 2021-08-15 DIAGNOSIS — Z3480 Encounter for supervision of other normal pregnancy, unspecified trimester: Secondary | ICD-10-CM | POA: Insufficient documentation

## 2021-08-15 DIAGNOSIS — O3432 Maternal care for cervical incompetence, second trimester: Secondary | ICD-10-CM | POA: Diagnosis not present

## 2021-08-16 ENCOUNTER — Other Ambulatory Visit: Payer: Self-pay | Admitting: *Deleted

## 2021-08-16 DIAGNOSIS — D563 Thalassemia minor: Secondary | ICD-10-CM

## 2021-08-16 DIAGNOSIS — O99332 Smoking (tobacco) complicating pregnancy, second trimester: Secondary | ICD-10-CM

## 2021-08-16 DIAGNOSIS — Z6838 Body mass index (BMI) 38.0-38.9, adult: Secondary | ICD-10-CM

## 2021-08-17 ENCOUNTER — Inpatient Hospital Stay (HOSPITAL_BASED_OUTPATIENT_CLINIC_OR_DEPARTMENT_OTHER): Payer: Commercial Managed Care - PPO

## 2021-08-17 ENCOUNTER — Other Ambulatory Visit: Payer: Self-pay

## 2021-08-17 ENCOUNTER — Inpatient Hospital Stay (HOSPITAL_COMMUNITY)
Admission: AD | Admit: 2021-08-17 | Discharge: 2021-08-20 | DRG: 805 | Disposition: A | Discharge status: Home / Self Care | Payer: Commercial Managed Care - PPO | Attending: Obstetrics & Gynecology | Admitting: Obstetrics & Gynecology

## 2021-08-17 ENCOUNTER — Encounter (HOSPITAL_COMMUNITY): Payer: Self-pay | Admitting: Obstetrics and Gynecology

## 2021-08-17 DIAGNOSIS — Z3480 Encounter for supervision of other normal pregnancy, unspecified trimester: Secondary | ICD-10-CM

## 2021-08-17 DIAGNOSIS — O99019 Anemia complicating pregnancy, unspecified trimester: Secondary | ICD-10-CM | POA: Diagnosis present

## 2021-08-17 DIAGNOSIS — O99210 Obesity complicating pregnancy, unspecified trimester: Secondary | ICD-10-CM

## 2021-08-17 DIAGNOSIS — Z3A23 23 weeks gestation of pregnancy: Secondary | ICD-10-CM

## 2021-08-17 DIAGNOSIS — O09899 Supervision of other high risk pregnancies, unspecified trimester: Secondary | ICD-10-CM

## 2021-08-17 DIAGNOSIS — O9902 Anemia complicating childbirth: Secondary | ICD-10-CM | POA: Diagnosis present

## 2021-08-17 DIAGNOSIS — D563 Thalassemia minor: Secondary | ICD-10-CM | POA: Diagnosis present

## 2021-08-17 DIAGNOSIS — O4692 Antepartum hemorrhage, unspecified, second trimester: Secondary | ICD-10-CM | POA: Diagnosis not present

## 2021-08-17 DIAGNOSIS — R7303 Prediabetes: Secondary | ICD-10-CM | POA: Diagnosis present

## 2021-08-17 DIAGNOSIS — O3432 Maternal care for cervical incompetence, second trimester: Secondary | ICD-10-CM | POA: Diagnosis present

## 2021-08-17 DIAGNOSIS — D509 Iron deficiency anemia, unspecified: Secondary | ICD-10-CM | POA: Diagnosis present

## 2021-08-17 DIAGNOSIS — O99214 Obesity complicating childbirth: Secondary | ICD-10-CM | POA: Diagnosis present

## 2021-08-17 DIAGNOSIS — B009 Herpesviral infection, unspecified: Secondary | ICD-10-CM | POA: Diagnosis present

## 2021-08-17 DIAGNOSIS — O99334 Smoking (tobacco) complicating childbirth: Secondary | ICD-10-CM | POA: Diagnosis present

## 2021-08-17 DIAGNOSIS — Z20822 Contact with and (suspected) exposure to covid-19: Secondary | ICD-10-CM | POA: Diagnosis present

## 2021-08-17 DIAGNOSIS — F17210 Nicotine dependence, cigarettes, uncomplicated: Secondary | ICD-10-CM | POA: Diagnosis present

## 2021-08-17 DIAGNOSIS — O99012 Anemia complicating pregnancy, second trimester: Secondary | ICD-10-CM | POA: Diagnosis present

## 2021-08-17 DIAGNOSIS — O322XX0 Maternal care for transverse and oblique lie, not applicable or unspecified: Secondary | ICD-10-CM | POA: Diagnosis present

## 2021-08-17 DIAGNOSIS — Z880 Allergy status to penicillin: Secondary | ICD-10-CM | POA: Diagnosis not present

## 2021-08-17 DIAGNOSIS — O3431 Maternal care for cervical incompetence, first trimester: Secondary | ICD-10-CM | POA: Diagnosis present

## 2021-08-17 DIAGNOSIS — O99330 Smoking (tobacco) complicating pregnancy, unspecified trimester: Secondary | ICD-10-CM

## 2021-08-17 DIAGNOSIS — N883 Incompetence of cervix uteri: Secondary | ICD-10-CM | POA: Diagnosis not present

## 2021-08-17 DIAGNOSIS — O99013 Anemia complicating pregnancy, third trimester: Secondary | ICD-10-CM | POA: Diagnosis present

## 2021-08-17 DIAGNOSIS — O99892 Other specified diseases and conditions complicating childbirth: Secondary | ICD-10-CM | POA: Diagnosis present

## 2021-08-17 DIAGNOSIS — R32 Unspecified urinary incontinence: Secondary | ICD-10-CM | POA: Diagnosis present

## 2021-08-17 LAB — URINALYSIS, ROUTINE W REFLEX MICROSCOPIC
Bilirubin Urine: NEGATIVE
Glucose, UA: NEGATIVE mg/dL
Ketones, ur: NEGATIVE mg/dL
Nitrite: NEGATIVE
Protein, ur: NEGATIVE mg/dL
Specific Gravity, Urine: <1.005 — ABNORMAL LOW (ref 1.005–1.030)
pH: 7 (ref 5.0–8.0)

## 2021-08-17 LAB — WET PREP, GENITAL
Clue Cells Wet Prep HPF POC: NONE SEEN
Sperm: NONE SEEN
Trich, Wet Prep: NONE SEEN
WBC, Wet Prep HPF POC: >=10 — AB (ref <10)
Yeast Wet Prep HPF POC: NONE SEEN

## 2021-08-17 LAB — URINALYSIS, MICROSCOPIC (REFLEX)

## 2021-08-17 LAB — CBC
HCT: 28.6 % — ABNORMAL LOW (ref 36.0–46.0)
Hemoglobin: 9.4 g/dL — ABNORMAL LOW (ref 12.0–15.0)
MCH: 29.5 pg (ref 26.0–34.0)
MCHC: 32.9 g/dL (ref 30.0–36.0)
MCV: 89.7 fL (ref 80.0–100.0)
Platelets: 370 10*3/uL (ref 150–400)
RBC: 3.19 MIL/uL — ABNORMAL LOW (ref 3.87–5.11)
RDW: 13.7 % (ref 11.5–15.5)
WBC: 14.7 10*3/uL — ABNORMAL HIGH (ref 4.0–10.5)
nRBC: 0 % (ref 0.0–0.2)

## 2021-08-17 LAB — RESP PANEL BY RT-PCR (FLU A&B, COVID) ARPGX2
Influenza A by PCR: NEGATIVE
Influenza B by PCR: NEGATIVE
SARS Coronavirus 2 by RT PCR: NEGATIVE

## 2021-08-17 LAB — TYPE AND SCREEN
ABO/RH(D): O POS
Antibody Screen: NEGATIVE

## 2021-08-17 LAB — APTT: aPTT: 27 seconds (ref 24–36)

## 2021-08-17 LAB — PROTIME-INR
INR: 0.9 (ref 0.8–1.2)
Prothrombin Time: 12.5 seconds (ref 11.4–15.2)

## 2021-08-17 LAB — FIBRINOGEN: Fibrinogen: 688 mg/dL — ABNORMAL HIGH (ref 210–475)

## 2021-08-17 MED ORDER — MAGNESIUM SULFATE 40 GM/1000ML IV SOLN
2.0000 g/h | INTRAVENOUS | Status: DC
  Administered 2021-08-17: 1 g/h via INTRAVENOUS
  Filled 2021-08-17: qty 1000

## 2021-08-17 MED ORDER — PRENATAL MULTIVITAMIN CH
1.0000 | ORAL_TABLET | Freq: Every day | ORAL | Status: DC
  Administered 2021-08-18 – 2021-08-19 (×2): 1 via ORAL
  Filled 2021-08-17 (×2): qty 1

## 2021-08-17 MED ORDER — DOCUSATE SODIUM 100 MG PO CAPS
100.0000 mg | ORAL_CAPSULE | Freq: Every day | ORAL | Status: DC
  Administered 2021-08-18 – 2021-08-19 (×2): 100 mg via ORAL
  Filled 2021-08-17 (×2): qty 1

## 2021-08-17 MED ORDER — LACTATED RINGERS IV SOLN
INTRAVENOUS | Status: DC
  Administered 2021-08-17: 10 mL/h via INTRAVENOUS

## 2021-08-17 MED ORDER — ZOLPIDEM TARTRATE 5 MG PO TABS
5.0000 mg | ORAL_TABLET | Freq: Every evening | ORAL | Status: DC | PRN
  Administered 2021-08-17: 5 mg via ORAL
  Filled 2021-08-17: qty 1

## 2021-08-17 MED ORDER — CALCIUM CARBONATE ANTACID 500 MG PO CHEW
2.0000 | CHEWABLE_TABLET | ORAL | Status: DC | PRN
  Administered 2021-08-18: 400 mg via ORAL
  Filled 2021-08-17: qty 2

## 2021-08-17 MED ORDER — LACTATED RINGERS IV BOLUS
1000.0000 mL | Freq: Once | INTRAVENOUS | Status: AC
  Administered 2021-08-17: 1000 mL via INTRAVENOUS

## 2021-08-17 MED ORDER — MAGNESIUM SULFATE BOLUS VIA INFUSION
4.0000 g | Freq: Once | INTRAVENOUS | Status: AC
  Administered 2021-08-17: 4 g via INTRAVENOUS
  Filled 2021-08-17: qty 1000

## 2021-08-17 MED ORDER — ACETAMINOPHEN 325 MG PO TABS
650.0000 mg | ORAL_TABLET | ORAL | Status: DC | PRN
  Administered 2021-08-17 – 2021-08-19 (×2): 650 mg via ORAL
  Filled 2021-08-17 (×2): qty 2

## 2021-08-17 MED ORDER — BETAMETHASONE SOD PHOS & ACET 6 (3-3) MG/ML IJ SUSP
12.0000 mg | INTRAMUSCULAR | Status: AC
  Administered 2021-08-17 – 2021-08-18 (×2): 12 mg via INTRAMUSCULAR
  Filled 2021-08-17 (×2): qty 5

## 2021-08-17 NOTE — Progress Notes (Signed)
CTSP regarding new onset vaginal bleeding and mild abdominal pain.  Upon arrival the patient is awake and alert and appears comfortable.  She notes intermittent " vaginal pain."  Inspection of perineum does not show active bleeding.  There was a small amount of stained blood on her pad.  2-3 small clots were noted in toilet.  Bedside ultrasound showed viable fetus  in unstable lie.  Discussed with patient and her mother on the phone current findings.  Discussed if delivery was imminent, due to current fetal position, she would likely need classical c section.  Explained to patient that classical c section will prevent any trial of labor in the future. Discussed if fetus is delivered we are at the limits of medical science and there may be increased risk of mortality for the fetus and  much increased risk of sequelae of severe prematurity if delivered at this stage.  Neonatologist called to discuss findings and need for increased urgency of neonatology consult to discuss possible interventions if the premature fetus is delivered and long term aftereffects.  1st dose of steroids just given at approx 1400.  If uterine activity increases will discuss magnesium sulfate for neuroprotection with MFM.   Mariel Aloe, MD Faculty attending, Center for [facility]

## 2021-08-17 NOTE — H&P (Signed)
FACULTY PRACTICE ANTEPARTUM ADMISSION HISTORY AND PHYSICAL NOTE   History of Present Illness: Briana Lynch is a 26 y.o. G1P0 at [redacted]w[redacted]d admitted for cervical incompetence.    Patient presents today reporting that she has felt cramping in her abdomen and back since last night. This morning she was having a bowel movement and she felt that something was coming out of her vagina. This morning also started having some light vaginal bleeding. No leaking fluid. No burning or pain with urination. No fevers.   Patient reports the fetal movement as active. Patient reports uterine contraction  activity as none. Patient reports  vaginal bleeding as less flow than a normal period. Patient describes fluid per vagina as None. Fetal presentation is  unstable .  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   Iron deficiency anemia 10/19/2014   Vitamin D deficiency 08/20/2014   Prediabetes 08/20/2014   PCOS (polycystic ovarian syndrome) 03/03/2013   Urinary incontinence 12/11/2012    Past Medical History:  Diagnosis Date   Ankle sprain and strain 11/15/2010   Anxiety state 12/09/2012   Depression    PCOS (polycystic ovarian syndrome)     Past Surgical History:  Procedure Laterality Date   ESOPHAGOGASTRODUODENOSCOPY (EGD) WITH PROPOFOL N/A 10/24/2020   Procedure: ESOPHAGOGASTRODUODENOSCOPY (EGD) WITH PROPOFOL;  Surgeon: Beverley Fiedler, MD;  Location: [facility] ENDOSCOPY;  Service: Gastroenterology;  Laterality: N/A;    OB History  Gravida Para Term Preterm AB Living  1            SAB IAB Ectopic Multiple  Live Births               # Outcome Date GA Lbr Len/2nd Weight Sex Delivery Anes PTL Lv  1 Current             Social History   Socioeconomic History   Marital status: Single    Spouse name: Not on file   Number of children: Not on file   Years of education: Not on file   Highest education level: 12th grade  Occupational History   Not on file  Tobacco Use   Smoking status: Every Day    Packs/day: 0.50    Types: Cigarettes   Smokeless tobacco: Never   Tobacco comments:    Dad smokes  Vaping Use   Vaping Use: Never used  Substance and Sexual Activity   Alcohol use: Not Currently    Alcohol/week: 0.0 standard drinks   Drug use: Yes    Types: Marijuana    Comment: infrequent   Sexual activity: Yes    Birth control/protection: None  Other Topics Concern   Not on file  Social History Narrative   2014:Sophomore in high school, taking AP classes, enjoys school manages basketball, football and track teams   Social Determinants of Health   Financial Resource Strain: Not on file  Food Insecurity: Not on file  Transportation Needs: Not on file  Physical Activity: Not on file  Stress: Not on file  Social Connections: Not on file    Family History  Problem Relation Age of Onset   Diabetes Mother  Hypertension Mother    Mental illness Sister     Allergies  Allergen Reactions   Penicillins Other (See Comments)    Did it involve swelling of the face/tongue/throat, SOB, or low BP? No Did it involve sudden or severe rash/hives, skin peeling, or any reaction on the inside of your mouth or nose? No Did you need to seek medical attention at a hospital or doctor's office? No When did it last happen? Never      If all above answers are "NO", may proceed with cephalosporin use.  Unknown-pt's sister is allergic    Medications Prior to Admission  Medication Sig Dispense Refill Last Dose   aspirin EC 81 MG tablet Take 1 tablet (81 mg total) by mouth daily. Take after 12  weeks for prevention of preeclampsia later in pregnancy 300 tablet 2 08/16/2021   Prenatal MV & Min w/FA-DHA (PRENATAL GUMMIES) 0.18-25 MG CHEW Chew by mouth.   08/16/2021   Blood Pressure Monitoring DEVI 1 each by Does not apply route once a week. 1 each 0     Review of Systems Pertinent positives and negative per HPI, all others reviewed and negative   Vitals:  BP 133/78    Pulse (!) 106    Temp 98.1 F (36.7 C) (Oral)    Resp 18    Ht 5\' 8"  (1.727 m)    Wt 123.7 kg    LMP 03/09/2021    SpO2 99%    BMI 41.46 kg/m  Physical Examination: Physical Exam Vitals reviewed.  Constitutional:      General: She is not in acute distress.    Appearance: Normal appearance. She is not ill-appearing or diaphoretic.  Eyes:     General: No scleral icterus. Cardiovascular:     Rate and Rhythm: Normal rate.  Pulmonary:     Effort: Pulmonary effort is normal. No respiratory distress.  Abdominal:     General: Abdomen is flat.     Palpations: Abdomen is soft.     Tenderness: There is no abdominal tenderness. There is no guarding.  Genitourinary:    General: Normal vulva.     Comments: Cervix visually dilated around 2-3 cm. Membranes and fetal hair observed Skin:    General: Skin is warm and dry.  Neurological:     General: No focal deficit present.     Mental Status: She is alert.  Psychiatric:        Mood and Affect: Mood normal.        Behavior: Behavior normal.        Thought Content: Thought content normal.        Judgment: Judgment normal.     Fetal Monitoring:Baseline: 150 bpm, Variability: Good {> 6 bpm), Accelerations: Reactive, and Decelerations: Absent Tocometer: Flat  Labs:  No results found for this or any previous visit (from the past 24 hour(s)).  Imaging Studies: [place] MFM OB DETAIL +14 WK  Result Date: 07/18/2021 ----------------------------------------------------------------------  OBSTETRICS REPORT                       (Signed Final 07/18/2021 05:11 pm)  ---------------------------------------------------------------------- Patient Info  ID #:       [id number]                          D.O.B.:  13-Jun-1996 (25 yrs)  Name:       Briana Lynch  Visit Date: 07/18/2021 02:54 pm ---------------------------------------------------------------------- Performed By  Attending:        Johnell Comings MD         Ref. Address:     [address]                                                             [place], [place]  Performed By:     Rodrigo Ran BS      [facility]                                 [facility] at                                                             [place] for                                                             Women  Referred By:      [facility]                    for Women ---------------------------------------------------------------------- Orders  #  Description                           Code        Ordered By  1  [place] MFM OB DETAIL +14 [place]               [id number]    Ethelean Colla ----------------------------------------------------------------------  #  Order #                     Accession #                Episode #  1  [id number]                   [id number]                 [id number] ---------------------------------------------------------------------- Indications  Obesity complicating pregnancy, second         O99.212  trimester (pregravid BMI 38)  [redacted] weeks gestation of pregnancy                Z3A.18  Antenatal screening for  malformations          Z36.3  Genetic carrier (silent alpha thal) partner    Z14.8  declined testing  Other mental disorder complicating             O99.340  pregnancy, second trimester  Low risk NIPS, neg AFP  Tobacco use complicating pregnancy,            O99.332  second trimester ---------------------------------------------------------------------- Fetal Evaluation  Num Of  Fetuses:         1  Fetal Heart Rate(bpm):  148  Cardiac Activity:       Observed  Presentation:           Variable  Placenta:               Posterior  P. Cord Insertion:      Visualized  Amniotic Fluid  AFI FV:      Within normal limits                              Largest Pocket(cm)                              7.3 ---------------------------------------------------------------------- Biometry  BPD:      42.5  mm     G. Age:  18w 6d         58  %    CI:        73.67   %    70 - 86                                                          FL/HC:      17.5   %    16.1 - 18.3  HC:      157.3  mm     G. Age:  18w 4d         38  %    HC/AC:      1.11        1.09 - 1.39  AC:      141.7  mm     G. Age:  19w 4d         73  %    FL/BPD:     64.9   %  FL:       27.6  mm     G. Age:  18w 3d         33  %    FL/AC:      19.5   %    20 - 24  HUM:      27.9  mm     G. Age:  19w 0d         59  %  CER:      19.5  mm     G. Age:  19w 0d         52  %  NFT:       2.4  mm  LV:        6.1  mm  CM:        2.3  mm  Est. FW:     267  gm      0  lb 9 oz     61  % ---------------------------------------------------------------------- OB History  Gravidity:    1         Term:   0        Prem:   0        SAB:   0  TOP:          0       Ectopic:  0        Living: 0 ---------------------------------------------------------------------- Gestational Age  LMP:           18w 5d        Date:  03/09/21                 EDD:   12/14/21  U/S Today:     18w 6d                                        EDD:   12/13/21  Best:          18w 5d     Det. By:  LMP  (03/09/21)          EDD:   12/14/21 ---------------------------------------------------------------------- Anatomy  Cranium:               Appears normal         Aortic Arch:            Appears normal  Cavum:                 Appears normal         Ductal Arch:            Appears normal  Ventricles:            Appears normal         Diaphragm:              Appears normal  Choroid Plexus:        Appears  normal         Stomach:                Appears normal, left                                                                        sided  Cerebellum:            Appears normal         Abdomen:                Appears normal  Posterior Fossa:       Appears normal         Abdominal Wall:         Appears nml (cord                                                                        insert,  abd wall)  Nuchal Fold:           Appears normal         Cord Vessels:           Appears normal (3                                                                        vessel cord)  Face:                  Appears normal         Kidneys:                Appear normal                         (orbits and profile)  Lips:                  Appears normal         Bladder:                Appears normal  Thoracic:              Appears normal         Spine:                  Appears normal  Heart:                 Not well visualized    Upper Extremities:      Appears normal  RVOT:                  Appears normal         Lower Extremities:      Appears normal  LVOT:                  Not well visualized  Other:  Nasal bone, lenses, heels/feet and open hands/5th digits visualized. ---------------------------------------------------------------------- Cervix Uterus Adnexa  Cervix  Length:            3.3  cm.  Normal appearance by transabdominal scan.  Uterus  No abnormality visualized.  Right Ovary  Within normal limits.  Left Ovary  Within normal limits.  Cul De Sac  No free fluid seen.  Adnexa  No abnormality visualized. ---------------------------------------------------------------------- Comments  This patient was seen for a detailed fetal anatomy scan due  to maternal obesity with a BMI of 38.  She denies any significant past medical history and denies  any problems in her current pregnancy.  She had a cell free DNA test earlier in her pregnancy which  indicated a low risk for trisomy 4, 84, and 13. A female fetus is  predicted.  She was  informed that the fetal growth and amniotic fluid  level were appropriate for her gestational age.  There were no obvious fetal anomalies noted on today's  ultrasound exam.  However, the views of the fetal anatomy  were limited today due to the fetal position.  The patient was informed that anomalies may be missed due  to technical limitations. If the fetus is in a suboptimal position  or maternal habitus is increased, visualization of the fetus  in  the maternal uterus may be impaired.  A follow-up exam was scheduled in 4 weeks to complete the  views of the fetal anatomy. ----------------------------------------------------------------------                   Johnell Comings, MD Electronically Signed Final Report   07/18/2021 05:11 pm ----------------------------------------------------------------------  [place] MFM OB FOLLOW UP  Result Date: 08/15/2021 ----------------------------------------------------------------------  OBSTETRICS REPORT                       (Signed Final 08/15/2021 03:57 pm) ---------------------------------------------------------------------- Patient Info  ID #:       [id number]                          D.O.B.:  10-02-1995 (25 yrs)  Name:       Briana Lynch                 Visit Date: 08/15/2021 02:47 pm ---------------------------------------------------------------------- Performed By  Attending:        Johnell Comings MD         Ref. Address:     [address]                                                             [place], [place]  Performed By:     Marye Round Pharisien     Location[facility]                                     [facility] at                                                             [place] for                                                             Women  Referred By:      [facility]                    for Women  ---------------------------------------------------------------------- Orders  #  Description  Code        Ordered By  1  [place] MFM OB FOLLOW UP                   [id number]    Peterson Ao ----------------------------------------------------------------------  #  Order #                     Accession #                Episode #  1  [id number]                   [id number]                 [id number] ---------------------------------------------------------------------- Indications  Obesity complicating pregnancy, second         O99.212  trimester (pregravid BMI 38)  Tobacco use complicating pregnancy,            O99.332  second trimester  Genetic carrier (silent alpha thal) partner    Z14.8  declined testing  Other mental disorder complicating             O99.340  pregnancy, second trimester  Anemia during pregnancy in second trimester    O99.012  Low risk NIPS, neg AFP  [redacted] weeks gestation of pregnancy                Z3A.22 ---------------------------------------------------------------------- Fetal Evaluation  Num Of Fetuses:         1  Fetal Heart Rate(bpm):  150  Cardiac Activity:       Observed  Presentation:           Cephalic  Placenta:               Posterior  P. Cord Insertion:      Previously Visualized  Amniotic Fluid  AFI FV:      Within normal limits                              Largest Pocket(cm)                              4.72 ---------------------------------------------------------------------- Biometry  BPD:      55.3  mm     G. Age:  22w 6d         52  %    CI:        78.23   %    70 - 86                                                          FL/HC:      19.8   %    19.2 - 20.8  HC:      197.8  mm     G. Age:  22w 0d         12  %    HC/AC:      1.06        1.05 - 1.21  AC:      187.2  mm     G. Age:  23w 3d         67  %  FL/BPD:     70.9   %    71 - 87  FL:       39.2  mm     G. Age:  22w 4d         35  %    FL/AC:      20.9   %    20 - 24  HUM:      37.7  mm     G. Age:  23w 2d          56  %  LV:          4  mm  Est. FW:     552  gm      1 lb 3 oz     57  % ---------------------------------------------------------------------- OB History  Gravidity:    1         Term:   0        Prem:   0        SAB:   0  TOP:          0       Ectopic:  0        Living: 0 ---------------------------------------------------------------------- Gestational Age  LMP:           22w 5d        Date:  03/09/21                 EDD:   12/14/21  U/S Today:     22w 5d                                        EDD:   12/14/21  Best:          22w 5d     Det. By:  LMP  (03/09/21)          EDD:   12/14/21 ---------------------------------------------------------------------- Anatomy  Cranium:               Appears normal         Aortic Arch:            Previously seen  Cavum:                 Appears normal         Ductal Arch:            Previously seen  Ventricles:            Appears normal         Diaphragm:              Appears normal  Choroid Plexus:        Previously seen        Stomach:                Appears normal, left                                                                        sided  Cerebellum:            Previously seen  Abdomen:                Appears normal  Posterior Fossa:       Previously seen        Abdominal Wall:         Appears nml (cord                                                                        insert, abd wall)  Nuchal Fold:           Previously seen        Cord Vessels:           Appears normal (3                                                                        vessel cord)  Face:                  Appears normal         Kidneys:                Appear normal                         (orbits and profile)  Lips:                  Appears normal         Bladder:                Appears normal  Thoracic:              Appears normal         Spine:                  Previously seen  Heart:                 Appears normal         Upper Extremities:      Previously seen                          (4CH, axis, and                         situs)  RVOT:                  Appears normal         Lower Extremities:      Previously seen  LVOT:                  Appears normal  Other:  Fetus appears to be a female. VC, 3VV and 3VTV visualized. Nasal          bone, lenses, heels/feet and open hands/5th digits previously          visualized. ---------------------------------------------------------------------- Cervix Uterus Adnexa  Cervix  Length:  3.47  cm.  Not visualized (advanced GA >24wks)  Uterus  Normal shape and size.  Right Ovary  Within normal limits.  Left Ovary  Within normal limits.  Cul De Sac  No free fluid seen.  Adnexa  No adnexal mass visualized. ---------------------------------------------------------------------- Comments  This patient was seen for a follow up exam as the views of  the fetal anatomy were unable to be fully visualized during  her last exam due to maternal obesity with a BMI of 38.  She  denies any problems since her last exam.  She was informed that the fetal growth and amniotic fluid  level appears appropriate for her gestational age.  The views of the fetal anatomy were visualized today.  There  were no obvious anomalies noted.  The limitations of ultrasound in the detection of all anomalies  was discussed.  Due to maternal obesity, a follow-up growth scan was  scheduled in the third trimester. ----------------------------------------------------------------------                   Johnell Comings, MD Electronically Signed Final Report   08/15/2021 03:57 pm ----------------------------------------------------------------------    Assessment and Plan: Patient Active Problem List   Diagnosis Date Noted   Cervical incompetence 08/17/2021   Alpha thalassemia silent carrier 06/16/2021   Anemia affecting pregnancy 06/01/2021   Obesity affecting pregnancy 05/25/2021   Tobacco smoking affecting pregnancy 05/25/2021   Supervision of other normal pregnancy,  antepartum 05/17/2021   Caustic injury gastritis    Suicide attempt ([place]) 10/23/2020   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   Iron deficiency anemia 10/19/2014   Vitamin D deficiency 08/20/2014   Prediabetes 08/20/2014   PCOS (polycystic ovarian syndrome) 03/03/2013   Urinary incontinence 12/11/2012   Cervical incompetence Admit to Antenatal Unclear if benefit to betamethasone and Mg at this gestational age or if more appropriate to wait until [redacted]w[redacted]d, will discuss w MFM TVUS to assess cervical length Consider rescue cerclage though risk benefit ratio not favorable at this time Will recheck presentation if she does progress in preterm labor to determine route of delivery Routine antenatal care GBS swab collected  Clarnce Flock, MD/MPH Attending Family Medicine Physician, [facility] for [place]

## 2021-08-17 NOTE — MAU Note (Signed)
Presents stating she was attempting to have have a BM, while having BM felt vaginal pressure and felt as if something was coming out.  States put hands in vagina and felt something hard.  Reports now having VB, not saturating sanitary napkin but similar to menstrual cycle.

## 2021-08-17 NOTE — Consult Note (Signed)
Consultation Service: Neonatology   Dr. Elgie Congo has asked for consultation on Briana Lynch regarding the care of a premature infant at 23.0 weeks. Thank you for inviting [place] to see this patient.   Reason for consult:  Explain the possible complications, the prognosis, and the care of a premature infant at 23 weeks.   Chief complaint: 26 yo female with a single IUP with an estimated weight of 552 grams.   My key findings of this patient's HPI are:  I have reviewed the patient's chart and have met with her. The salient information is as follows. Briana Lynch is a 26 yo G1P0 with concern for cervical incompetence and abruption. Mother is dilated 2-3 cm with membranes and fetal hair visible. On u/s infant with transverse fetal lie.   Active Problems:  Cervical incompetence 08/17/2021   Alpha thalassemia silent carrier 06/16/2021   Anemia affecting pregnancy 06/01/2021   Obesity affecting pregnancy 05/25/2021   Tobacco smoking affecting pregnancy 05/25/2021   Supervision of other normal pregnancy, antepartum 05/17/2021   Caustic injury gastritis     Suicide attempt ([place]) 10/23/2020   HSV-2 (herpes simplex virus 2) infection      Prenatal labs:Testing negative, h/o HSV   Prenatal care:   good Maternal Steroids: 1st dose given 2/2 Neurprotective mag started 2/22  My recommendations for this patient and my actions included:   1. In the presence of the Briana Lynch, I spent 35 minutes discussing the possible complications and outcomes of prematurity at this gestational age. I discussed the common complications and survival data of the premature infant at this age. I discussed the potential need for resuscitation at birth, mechanical ventilation and surfactant administration for respiratory distress, IV fluids pending establishment of enteral feeds (encouraged breast milk feeding to which she planned to do), antibiotics for possible sepsis, temperature support, and monitoring. I also discussed the  potential risk of complications such as intracranial hemorrhage, retinopathy, hearing deficit, and chronic lung disease. I also discussed the potential length of stay in the neonatal intensive care unit for about 20 weeks. I discussed this with mother in detail and she expressed an understanding of the risks and complications of prematurity.   2. I also discussed the expected survival of an infant born at 24 weeks, which is near 30% with steroids. We further discussed that roughly 34-78% of neonates born at this age have severe neurological complications.   3. I informed her that the NICU team would be present at the delivery. She agreed that all appropriate medical measures could be taken to resuscitate her infant at the delivery. She also understood that depending on her infant's initial condition and NICU course, some difficult decisions may have to be made.     Final Impression:  26 yo  female with a 23 week IUP who is threatening to deliver and who now understands the possible complications and prognosis of her infant.     Davonna Belling, MD Attending Neonatologist

## 2021-08-18 ENCOUNTER — Encounter (HOSPITAL_COMMUNITY): Payer: Self-pay | Admitting: Family Medicine

## 2021-08-18 ENCOUNTER — Other Ambulatory Visit: Payer: Self-pay

## 2021-08-18 DIAGNOSIS — O09899 Supervision of other high risk pregnancies, unspecified trimester: Secondary | ICD-10-CM

## 2021-08-18 DIAGNOSIS — Z3A23 23 weeks gestation of pregnancy: Secondary | ICD-10-CM

## 2021-08-18 HISTORY — DX: Supervision of other high risk pregnancies, unspecified trimester: O09.899

## 2021-08-18 LAB — GC/CHLAMYDIA PROBE AMP (~~LOC~~) NOT AT ARMC
Chlamydia: NEGATIVE
Comment: NEGATIVE
Comment: NORMAL
Neisseria Gonorrhea: NEGATIVE

## 2021-08-18 MED ORDER — LIDOCAINE HCL (PF) 1 % IJ SOLN: 30.0000 mL | INTRAMUSCULAR | Status: DC | PRN

## 2021-08-18 MED ORDER — OXYTOCIN-SODIUM CHLORIDE 30-0.9 UT/500ML-% IV SOLN
2.5000 [IU]/h | INTRAVENOUS | Status: DC
  Filled 2021-08-18: qty 500

## 2021-08-18 MED ORDER — FENTANYL CITRATE (PF) 100 MCG/2ML IJ SOLN: 50.0000 ug | INTRAMUSCULAR | Status: DC | PRN

## 2021-08-18 MED ORDER — SOD CITRATE-CITRIC ACID 500-334 MG/5ML PO SOLN: 30.0000 mL | ORAL | Status: DC | PRN

## 2021-08-18 MED ORDER — INDOMETHACIN 25 MG PO CAPS
50.0000 mg | ORAL_CAPSULE | Freq: Once | ORAL | Status: AC
  Administered 2021-08-18: 50 mg via ORAL
  Filled 2021-08-18: qty 2

## 2021-08-18 MED ORDER — ONDANSETRON HCL 4 MG/2ML IJ SOLN: 4.0000 mg | Freq: Four times a day (QID) | INTRAMUSCULAR | Status: DC | PRN

## 2021-08-18 MED ORDER — ACETAMINOPHEN 325 MG PO TABS: 650.0000 mg | ORAL_TABLET | ORAL | Status: DC | PRN

## 2021-08-18 MED ORDER — OXYCODONE-ACETAMINOPHEN 5-325 MG PO TABS: 2.0000 | ORAL_TABLET | ORAL | Status: DC | PRN

## 2021-08-18 MED ORDER — LACTATED RINGERS IV SOLN: INTRAVENOUS | Status: DC

## 2021-08-18 MED ORDER — OXYTOCIN BOLUS FROM INFUSION
333.0000 mL | Freq: Once | INTRAVENOUS | Status: AC
  Administered 2021-08-18: 333 mL via INTRAVENOUS

## 2021-08-18 MED ORDER — OXYCODONE-ACETAMINOPHEN 5-325 MG PO TABS
1.0000 | ORAL_TABLET | ORAL | Status: DC | PRN
  Administered 2021-08-19 (×2): 1 via ORAL
  Filled 2021-08-18 (×3): qty 1

## 2021-08-18 MED ORDER — LACTATED RINGERS IV SOLN: 500.0000 mL | INTRAVENOUS | Status: DC | PRN

## 2021-08-18 MED ORDER — CEFAZOLIN SODIUM-DEXTROSE 1-4 GM/50ML-% IV SOLN
1.0000 g | Freq: Three times a day (TID) | INTRAVENOUS | Status: DC
  Filled 2021-08-18: qty 50

## 2021-08-18 MED ORDER — CEFAZOLIN SODIUM-DEXTROSE 2-4 GM/100ML-% IV SOLN
2.0000 g | Freq: Once | INTRAVENOUS | Status: AC
  Administered 2021-08-18: 2 g via INTRAVENOUS
  Filled 2021-08-18: qty 100

## 2021-08-18 MED ORDER — INDOMETHACIN 25 MG PO CAPS
25.0000 mg | ORAL_CAPSULE | Freq: Four times a day (QID) | ORAL | Status: DC
  Filled 2021-08-18 (×2): qty 1

## 2021-08-18 MED ORDER — IBUPROFEN 600 MG PO TABS
600.0000 mg | ORAL_TABLET | Freq: Four times a day (QID) | ORAL | Status: DC
  Administered 2021-08-18 – 2021-08-20 (×8): 600 mg via ORAL
  Filled 2021-08-18 (×8): qty 1

## 2021-08-18 NOTE — Progress Notes (Signed)
Kandy Garrison is a 26 y.o. G1P0 at [redacted]w[redacted]d by LMP admitted for Preterm labor  Subjective: sharp pelvic pain more frequent   Objective: BP (!) 92/52 (BP Location: Right Arm)    Pulse 83    Temp 98 F (36.7 C) (Oral)    Resp 18    Ht 5\' 8"  (1.727 m)    Wt 123.7 kg    LMP 03/09/2021    SpO2 100%    BMI 41.46 kg/m  I/O last 3 completed shifts: In: 734.8 [P.O.:720; I.V.:14.8] Out: 1600 [Urine:1600] Total I/O In: 600 [P.O.:600] Out: 600 [Urine:600]  FHT:  135 UC:   none, none on toco SVE:   Dilation: 2.5 Exam by:: Dr. Dione Plover Bulging bag cx not palpated Bedside US cephalic Labs: Lab Results  Component Value Date   WBC 14.7 (H) 08/17/2021   HGB 9.4 (L) 08/17/2021   HCT 28.6 (L) 08/17/2021   MCV 89.7 08/17/2021   PLT 370 08/17/2021    Assessment / Plan: Preterm labor   Preeclampsia:  on magnesium sulfate and no signs or symptoms of toxicity Fetal Wellbeing:  Category I To LD for monitoring anticipate SVD. GBS unknown will give PCN  Emeterio Reeve 08/18/2021, 1:31 AM

## 2021-08-18 NOTE — Progress Notes (Signed)
Pt transferred to LXD Room 215. Report was given to RN Berna Spare.

## 2021-08-18 NOTE — Discharge Summary (Addendum)
Postpartum Discharge Summary       Patient Name: Briana Lynch DOB: 04/19/1996 MRN: [id number]  Date of admission: 08/17/2021 Delivery date:08/18/2021  Delivering provider: Renard Matter  Date of discharge: 08/20/2021  Admitting diagnosis: Cervical incompetence [N88.3] Intrauterine pregnancy: [redacted]w[redacted]d     Secondary diagnosis:  Principal Problem:   Cervical incompetence Active Problems:   Urinary incontinence   Prediabetes   HSV-2 (herpes simplex virus 2) infection   Supervision of other normal pregnancy, antepartum   Anemia affecting pregnancy   Preterm delivery  Additional problems: None    Discharge diagnosis: Preterm Pregnancy Delivered                                              Post partum procedures:   Augmentation: N/A Complications:   Hospital course: Onset of Labor With Vaginal Delivery      26 y.o. yo G1P0101 at [redacted]w[redacted]d was admitted for bulging bag of water and preterm labor on 08/17/2021. Patient had an uncomplicated labor course as follows:  Membrane Rupture Time/Date: 5:45 AM ,08/18/2021   Delivery Method:Vaginal, Spontaneous  Episiotomy: None  Lacerations:  None  Patient had an uncomplicated postpartum course.  She is ambulating, tolerating a regular diet, passing flatus, and urinating well. Patient is discharged home in stable condition on 08/20/21.  Newborn Data: Birth date:08/18/2021  Birth time:5:45 AM  Gender:Female  Living status:Living  Apgars:6 ,8  Weight:570 g   Magnesium Sulfate received: Yes: Neuroprotection  BMZ received: Yes Rhophylac:N/A MMR:Yes T-DaP: Not done Flu: declined Transfusion:No  Physical exam  Vitals:   08/19/21 0730 08/19/21 1147 08/19/21 1714 08/19/21 2043  BP: (!) 116/53 (!) 108/55 108/63 (!) 106/59  Pulse: 98 100 (!) 101 84  Resp: $Remo'18 17 18 17  'oaQOj$ Temp: 98.7 F (37.1 C) 97.8 F (36.6 C) 97.9 F (36.6 C) 98.3 F (36.8 C)  TempSrc: Oral Oral Oral Oral  SpO2: 100% 100% 100% 100%  Weight:      Height:       General: alert,  cooperative, and no distress Lochia: appropriate Uterine Fundus: firm Incision:  DVT Evaluation: No evidence of DVT seen on physical exam. Labs: Lab Results  Component Value Date   WBC 14.7 (H) 08/17/2021   HGB 9.4 (L) 08/17/2021   HCT 28.6 (L) 08/17/2021   MCV 89.7 08/17/2021   PLT 370 08/17/2021   CMP Latest Ref Rng & Units 10/25/2020  Glucose 70 - 99 mg/dL 90  BUN 6 - 20 mg/dL <5(L)  Creatinine 0.44 - 1.00 mg/dL 0.78  Sodium 135 - 145 mmol/L 137  Potassium 3.5 - 5.1 mmol/L 3.8  Chloride 98 - 111 mmol/L 108  CO2 22 - 32 mmol/L 25  Calcium 8.9 - 10.3 mg/dL 8.5(L)  Total Protein 6.5 - 8.1 g/dL -  Total Bilirubin 0.3 - 1.2 mg/dL -  Alkaline Phos 38 - 126 U/L -  AST 15 - 41 U/L -  ALT 0 - 44 U/L -   Edinburgh Score: Edinburgh Postnatal Depression Scale Screening Tool 08/18/2021  I have been able to laugh and see the funny side of things. 0  I have looked forward with enjoyment to things. 0  I have blamed myself unnecessarily when things went wrong. 0  I have been anxious or worried for no good reason. 0  I have felt scared or panicky for no good reason.  0  Things have been getting on top of me. 0  I have been so unhappy that I have had difficulty sleeping. 0  I have felt sad or miserable. 0  I have been so unhappy that I have been crying. 0  The thought of harming myself has occurred to me. 0  Edinburgh Postnatal Depression Scale Total 0     After visit meds:  Allergies as of 08/20/2021       Reactions   Penicillins Other (See Comments)   Did it involve swelling of the face/tongue/throat, SOB, or low BP? No Did it involve sudden or severe rash/hives, skin peeling, or any reaction on the inside of your mouth or nose? No Did you need to seek medical attention at a hospital or doctor's office? No When did it last happen? Never      If all above answers are "NO", may proceed with cephalosporin use. Unknown-pt's sister is allergic        Medication List     STOP  taking these medications    aspirin EC 81 MG tablet       TAKE these medications    Blood Pressure Monitoring Devi 1 each by Does not apply route once a week.   ibuprofen 600 MG tablet Commonly known as: ADVIL Take 1 tablet (600 mg total) by mouth every 6 (six) hours.   Prenatal Gummies 0.18-25 MG Chew Chew by mouth.               Durable Medical Equipment  (From admission, onward)           Start     Ordered   08/20/21 0358  For home use only DME double electric breast pump  Once       Comments: ICD code Z39.1   08/20/21 0358             Discharge home in stable condition Infant Feeding:  n/a Infant Disposition:NICU Discharge instruction: per After Visit Summary and Postpartum booklet. Activity: Advance as tolerated. Pelvic rest for 6 weeks.  Diet: routine diet Future Appointments: Future Appointments  Date Time Provider [place]  09/19/2021  8:15 AM [facility]   Follow up Visit:  [place] for [place] at [facility] Follow up in 5 week(s).   Specialty: Obstetrics and Gynecology Why: pp visit Contact information: [place] [id number] [phone]               Message sent to [facility] RN) by Dr. Cy Blamer on 08/18/2021  Please schedule this patient for a In person postpartum visit in 4 weeks with the following provider: Any provider. Additional Postpartum F/U: None   High risk pregnancy complicated by:  preterm delivery Delivery mode:  Vaginal, Spontaneous  Anticipated Birth Control:  Unsure   08/20/2021 Florian Buff, MD

## 2021-08-18 NOTE — Plan of Care (Signed)
Problem: Education: °Goal: Knowledge of General Education information will improve °Description: Including pain rating scale, medication(s)/side effects and non-pharmacologic comfort measures °Outcome: Completed/Met °  °

## 2021-08-18 NOTE — Lactation Note (Signed)
This note was copied from a baby's chart. Lactation Consultation Note  Patient Name: Briana Lynch [id number] Date: 08/18/2021 Reason for consult: Initial assessment;Primapara;1st time breastfeeding;NICU baby;Infant < 6lbs;Preterm <34wks;Maternal endocrine disorder Age:25 hours  Visited with mom of 6 hours old pre-term NICU female, she's a P1 and reports (+) breast changes during the pregnancy. LC assisted with hand expression (no colostrum noted yet) and pump settings. OB Specialty care RN Briana Lynch has already set up a pump for mom in her room, this consult took place in the NICU.  Mom inquired about getting a wearable pump, explained to her that the best type of pump to establish supply for a NICU baby is a hospital grade one, followed by a full size personal pump. She also inquired about smoking and BF, advised her to cut back/quit on cigarettes but also let her know that the benefits of BF will outweigh the risks of smoking.   Reviewed pumping schedule, pumping log, lactogenesis II, expectations and benefits of premature milk.    Maternal Data Has patient been taught Hand Expression?: Yes Does the patient have breastfeeding experience prior to this delivery?: No  Feeding Mother's Current Feeding Choice: Breast Milk  Lactation Tools Discussed/Used Tools: Pump Breast pump type: Double-Electric Breast Pump Pump Education: Setup, frequency, and cleaning;Milk Storage Reason for Pumping: pre-term infant in NICU Pumping frequency: q 3 hours (recommended) Pumped volume: 0 mL  Interventions Interventions: Breast feeding basics reviewed;Breast massage;Hand express;DEBP;Education;"The NICU and Your Baby" book;LC Services brochure  Plan of care Encouraged mom to start pumping every 3 hours, at least 8 pumping sessions/24 hours Breast massage and hand expression were also encouraged prior pumping OB Specialty care RN Briana Lynch to put an order for a stork pump, mom has private insurance through  [employer]  No other support person at this time. All questions and concerns answered, mom to call NICU LC PRN.  Discharge Pump: DEBP WIC Program: Yes  Consult Status Consult Status: Follow-up Date: 08/18/21 Follow-up type: In-patient   Briana Lynch Briana Lynch 08/18/2021, 12:38 PM

## 2021-08-18 NOTE — Progress Notes (Signed)
Briana Lynch is a 26 y.o. G1P0 at [redacted]w[redacted]d  being transferred from [facility] specialty to L&D for worsening abdominal cramping and pain in setting of preterm labor  Subjective: Reports in between cramping feels ok. But cramping is getting more intense.  Objective: BP 120/71    Pulse 80    Temp 98 F (36.7 C) (Oral)    Resp 17    Ht 5\' 8"  (1.727 m)    Wt 123.7 kg    LMP 03/09/2021    SpO2 100%    BMI 41.46 kg/m  I/O last 3 completed shifts: In: 734.8 [P.O.:720; I.V.:14.8] Out: 1600 [Urine:1600] Total I/O In: 600 [P.O.:600] Out: 600 [Urine:600]  FHT:  FHR: 140 bpm, variability: moderate,  accelerations:  Present,  decelerations:  Present occassional variable decel UC:   regular, every 3-4 minutes SVE:   Dilation: 2.5 Exam by:: Dr. Dione Plover  Labs: Lab Results  Component Value Date   WBC 14.7 (H) 08/17/2021   HGB 9.4 (L) 08/17/2021   HCT 28.6 (L) 08/17/2021   MCV 89.7 08/17/2021   PLT 370 08/17/2021    Assessment / Plan: G1P0 at [redacted]w[redacted]d  being transferred from [facility] specialty to L&D for worsening abdominal cramping and pain in setting of preterm labor with bulging membranes (on visual exam and [place]). Vertex by [place] on OB specialty prior to transfer. - already on Mg for neuroprotection as well as tocolysis - will add on Indocin for further tocolysis - Start IV antibiotics for GBS ppx (swab sent but still pending). Per chart PCN allergy listed. Patient reports she has never gotten PCN but her sister had a very severe reaction as a child (hair loss and significant rash) and therefore her mother has avoided PCN for her and her siblings. We discussed the gold standard for treatment/PPX is PCN however at this time patient prefers to avoid PCN. Discussed with pharmacy and will start Ancef for patient - Will do second dose of BMZ at 12 hrs since last dose which was given at [facility] ([facility]) - will avoid cervical check at this time unless clinically warranted - NICU already saw patient while on OB specialty, transport  warmer brought to room - Discussed pain control options and at this time patient reports she can hold off.   Renard Matter, MD, MPH OB Fellow, Faculty Practice

## 2021-08-18 NOTE — Progress Notes (Signed)
CSW acknowledged consult and will meet with MOB once magnesium is discontinued.  ° °Amarionna Arca, LCSW °Clinical Social Worker °[facility] °Cell#: [phone] °

## 2021-08-19 LAB — CULTURE, BETA STREP (GROUP B ONLY)

## 2021-08-19 NOTE — Clinical Social Work Maternal (Signed)
°CLINICAL SOCIAL WORK MATERNAL/CHILD NOTE ° °Patient Details  °Name: Briana Lynch °MRN: [id number] °Date of Birth: 05/03/1996 ° °Date:  08/19/2021 ° °Clinical Social Worker Initiating Note:  Mar Zettler, LCSW Date/Time: Initiated:  08/19/21/1413    ° °Child's Name:  Briana Lynch  ° °Biological Parents:  Mother, Father (Father: Briana Lynch)  ° °Need for Interpreter:  None  ° °Reason for Referral:  Parental Support of Premature Babies < 32 weeks/or Critically Ill babies, Behavioral Health Concerns  ° °Address:  [address] °[place] [address]  °  °Phone number:  [phone] (home)    ° °Additional phone number:  ° °Household Members/Support Persons (HM/SP):   Household Member/Support Person 1, Household Member/Support Person 2 ° ° °HM/SP Name Relationship DOB or Age  °HM/SP -1   mother    °HM/SP -2   father    °HM/SP -3        °HM/SP -4        °HM/SP -5        °HM/SP -6        °HM/SP -7        °HM/SP -8        ° ° °Natural Supports (not living in the home):  Spouse/significant other  ° °Professional Supports: None  ° °Employment: Full-time  ° °Type of Work: [employer] (Work from home)  ° °Education:  College graduate  ° °Homebound arranged:   ° °Financial Resources:  Private Insurance , Medicaid  ° °Other Resources:  WIC  ° °Cultural/Religious Considerations Which May Impact Care:   ° °Strengths:  Ability to meet basic needs  , Pediatrician chosen, Understanding of illness, Home prepared for child    ° °Psychotropic Medications:        ° °Pediatrician:    [place] area ° °Pediatrician List:  ° °[place] Other ([facility]  °[place]    °[place]    °[place]    °[place]    °[place]    ° ° °Pediatrician Fax Number:   ° °Risk Factors/Current Problems:  Mental Health Concerns    ° °Cognitive State:  Able to Concentrate  , Alert  , Goal Oriented  , Linear Thinking    ° °Mood/Affect:  Calm  , Interested  , Comfortable    ° °CSW Assessment:  CSW met with MOB at bedside to complete psychosocial assessment. CSW introduced self and explained role. MOB was welcoming, pleasant, and remained engaged during assessment. MOB reported that she resides with her parents and recently started a new job working from home. MOB reported that she receives WIC. MOB reported having a lot of items for infant including a car seat. MOB reported that she still has to get a crib, noting she will be able to get one prior to infant's discharge. CSW inquired about MOB's support system, MOB reported that her parents and FOB are supports.  ° °CSW inquired about MOB's mental health history. MOB reported that she was diagnosed with Bipolar Disorder 5 or 6 years ago. MOB denied any symptoms of Bipolar Disorder. MOB reported that she was diagnosed with Major Depressive Disorder last year. MOB denied any current symptoms of MDD and was unable to recall the last time she had symptoms. MOB reported that she has been hospitalized a few times in the past but is currently in a good place. MOB reported that she is not taking any medication nor participating in therapy to treat mental health   diagnoses. CSW inquired about how MOB was feeling emotionally since giving birth, MOB reported that she is feeling good and elaborated stating that of course she would like to be with baby at all times in the same room. CSW acknowledged, normalized, and validated MOB's feelings. MOB presented calm and did not demonstrate any acute mental health signs/symptoms. CSW assessed for safety, MOB denied SI, HI, and domestic violence.  ° °CSW provided education regarding the baby blues period vs. perinatal mood disorders, discussed treatment and gave resources for mental health follow up if concerns arise.  CSW recommends self-evaluation during the postpartum time period using the New Mom Checklist from Postpartum Progress and encouraged MOB to contact a medical professional if symptoms are noted at any time.    ° °CSW did not provide a review of Sudden Infant Death Syndrome (SIDS) precautions at this time. SIDS education will be provided prior to discharge.  ° °CSW and MOB discussed infant's NICU admission. CSW informed about the NICU, what to expect, and resources/supports available while infant is admitted to the NICU. MOB reported that she feels well informed about infant's care and denied any transportation barriers with visiting infant in the NICU. MOB denied any questions/concerns regarding the NICU. CSW informed MOB that infant qualifies to apply for SSI benefits, MOB reported that she is interested. CSW explained SSI benefits and application process. CSW provided MOB with paperwork about applying for SSI benefits.    ° °CSW will continue to offer resources/supports while infant is admitted to the NICU.  ° ° °CSW Plan/Description:  Perinatal Mood and Anxiety Disorder (PMADs) Education, Psychosocial Support and Ongoing Assessment of Needs, Other Patient/Family Education, Supplemental Security Income (SSI) Information  ° ° °Sherrita Riederer L Shashwat Cleary, LCSW °08/19/2021, 2:15 PM °

## 2021-08-19 NOTE — Lactation Note (Signed)
This note was copied from a baby's chart. Lactation Consultation Note  Patient Name: Briana Lynch [id number] Date: 08/19/2021   Age:25 hours  Attempted to visit with mom but she was asleep. LC to F/U at a later time.  Bea Duren S Emanuelle Bastos 08/19/2021, 10:47 AM

## 2021-08-19 NOTE — Lactation Note (Signed)
This note was copied from a baby's chart. Lactation Consultation Note  Patient Name: Briana Lynch [id number] Date: 08/19/2021 Reason for consult: Maternal endocrine disorder;Follow-up assessment;NICU baby;1st time breastfeeding;Primapara;Infant < 6lbs;Preterm <34wks Age:25 hours  Visited with mom of 29 hours old pre-term NICU female, she's a P1 and reports pumping is going well. She has been pumping consistently every 3 hours and getting some drops of colostrum, praised her for her efforts.  Provided a hands-free pumping top, mom voiced that she's already taking those droplets of EBM to the NICU, NICU RN assisted with oral care last night and was able to "rescued" droplets left within the flange/membrane that could not go into the container.  Maternal Data  Mom's supply is WNL  Feeding Mother's Current Feeding Choice: Breast Milk  Lactation Tools Discussed/Used Tools: Pump;Hands-free pumping top Breast pump type: Double-Electric Breast Pump Pump Education: Setup, frequency, and cleaning;Milk Storage Reason for Pumping: pre-term in NICU Pumping frequency: 8 times/24 hours Pumped volume:  (drops)  Interventions Interventions: Breast feeding basics reviewed;DEBP;Education  Plan of care Encouraged mom to continue pumping every 3 hours, at least 8 pumping sessions/24 hours Breast massage and hand expression were also encouraged prior pumping OB Specialty care RN Thayer Ohm put an order for a stork pump yesterday, mom has private insurance through [employer], still waiting to hear back from [employer]; [facility] referral was sent yesterday   No other support person at this time. All questions and concerns answered, mom to call NICU LC PRN.  Discharge Pump: DEBP  Consult Status Consult Status: Follow-up Date: 08/19/21 Follow-up type: In-patient   Jud Fanguy Venetia Constable 08/19/2021, 11:48 AM

## 2021-08-19 NOTE — Progress Notes (Signed)
Post Partum Day 1 Subjective: no complaints, up ad lib, voiding, tolerating PO, and + flatus  Objective: Blood pressure (!) 116/53, pulse 98, temperature 98.7 F (37.1 C), temperature source Oral, resp. rate 18, height 5\' 8"  (1.727 m), weight 123.7 kg, last menstrual period 03/09/2021, SpO2 100 %, unknown if currently breastfeeding.  Physical Exam:  General: alert, cooperative, and no distress Lochia: appropriate Uterine Fundus: firm Incision:  DVT Evaluation: No evidence of DVT seen on physical exam.  Recent Labs    08/17/21 1155  HGB 9.4*  HCT 28.6*    Assessment/Plan: Baby is doing relatively well Observe today Discharge tomorrow most likely   LOS: 2 days   10/15/21 08/19/2021, 7:54 AM

## 2021-08-20 MED ORDER — OXYCODONE-ACETAMINOPHEN 5-325 MG PO TABS
1.0000 | ORAL_TABLET | Freq: Once | ORAL | Status: AC
  Administered 2021-08-20: 1 via ORAL

## 2021-08-20 MED ORDER — IBUPROFEN 600 MG PO TABS: 600.0000 mg | ORAL_TABLET | Freq: Four times a day (QID) | ORAL | 0 refills | Status: DC

## 2021-08-20 NOTE — Progress Notes (Signed)
Pt. went up to NICU after given percocet for HA. When rounded on at 12:00 pt. was no longer in room and no longer in NICU when called. Pt. left before RN was able to assess pain scale after medication.

## 2021-08-20 NOTE — Lactation Note (Signed)
This note was copied from a baby's chart. Lactation Consultation Note  Patient Name: Briana Lynch [id number] Date: 08/20/2021 Reason for consult: Follow-up assessment;Maternal endocrine disorder;Preterm <34wks;NICU baby;Infant < 6lbs;1st time breastfeeding;Primapara;Maternal discharge Age:26 years  Visited with mom of 26 years old pre-term NICU female, she's a P1 and reports her milk is slowly coming in. Mom is going home today,  reviewed discharge education, pump settings, lactogenesis II and expectations for the next few days.  Maternal Data  Mom's supply is WNL  Feeding Mother's Current Feeding Choice: Breast Milk  Lactation Tools Discussed/Used Tools: Pump Breast pump type: Double-Electric Breast Pump Pump Education: Setup, frequency, and cleaning;Milk Storage Reason for Pumping: pre-term in NICU Pumping frequency: 8 times/24 hours Pumped volume: 4 mL  Interventions Interventions: Breast feeding basics reviewed;DEBP;Education  Plan of care Encouraged mom to continue pumping every 3 hours, at least 8 pumping sessions/24 hours She'll take all her pump pieces to baby's room. Will switch from initiation to expression mode after her milk comes in She'll notify lactation in case she doesn't receive her Stork pump, it's supposed to be delivered within the next hour.   Female visitor present but asleep. All questions and concerns answered, mom to call NICU LC PRN.  Discharge Discharge Education: Engorgement and breast care Pump: DEBP;Stork Pump  Consult Status Consult Status: Follow-up Date: 08/20/21 Follow-up type: In-patient   Kol Consuegra Venetia Constable 08/20/2021, 10:14 AM

## 2021-08-21 LAB — SURGICAL PATHOLOGY

## 2021-08-22 ENCOUNTER — Ambulatory Visit: Payer: Self-pay

## 2021-08-22 NOTE — Lactation Note (Signed)
This note was copied from a baby's chart. Lactation Consultation Note  Patient Name: Briana Lynch Date: 08/22/2021 Reason for consult: Follow-up assessment;NICU baby Age:25 days  MOB reports no signs or symptoms of engorgement. Breast were slightly tender when pumping, but not uncomfortable.   We answered MOB questions about nutrition during breast pumping.    Feeding Mother's Current Feeding Choice: Breast Milk   Lactation Tools Discussed/Used Pumping frequency: q3h Pumped volume: 10 mL  Interventions Interventions: Education  Consult Status Consult Status: Follow-up Date: 08/22/21 Follow-up type: In-patient    Kadra Kohan Robinson-Sullivan 08/22/2021, 9:02 AM

## 2021-08-24 ENCOUNTER — Ambulatory Visit: Payer: Self-pay

## 2021-08-24 NOTE — Lactation Note (Signed)
This note was copied from a baby's chart. Lactation Consultation Note  Patient Name: Briana Lynch [id number] Date: 08/24/2021 Reason for consult: Follow-up assessment;NICU baby;Mother's request;Preterm <34wks;Infant < 6lbs;Primapara;1st time breastfeeding;Maternal endocrine disorder Age:25 days  Visited with mom of 71 33/33 weeks old (adjusted) NICU female, she's a P1 and reports a slight increase in her supply but it's still < 30 ml at 6 days pp. She called out for [facility] because milk was spilling, assisted mom with pumping session and advised mom to flip her flanges horizontally instead of vertically, this seemed to solve the spillage problem.  Reviewed lactogenesis II/II, pumping schedule and pumping log, advised mom to start tracking her pumping sessions since she mentioned that sometimes she might go 4 hours without pumping. Explained to her the importance of consistent pumping for the full onset of lactogenesis II and to protect her supply.  Maternal Data  Mom's supply continues to slowly increase and is WNL but on the lower side  Feeding Mother's Current Feeding Choice: Breast Milk  Lactation Tools Discussed/Used Tools: Pump;Flanges Flange Size: 24 Breast pump type: Double-Electric Breast Pump Pump Education: Setup, frequency, and cleaning;Milk Storage Reason for Pumping: pre-term in NICU Pumping frequency: 7 times/24 hours Pumped volume: 20 mL  Interventions Interventions: Breast feeding basics reviewed;DEBP;Education  Plan of care Encouraged mom to start pumping consistently every 3 hours, at least 8 pumping sessions/24 hours She'll wake up at least once at night to pump   No other support person at this time. All questions and concerns answered, mom to call NICU LC PRN.  Discharge Pump: DEBP;Stork Pump  Consult Status Consult Status: Follow-up Date: 08/24/21 Follow-up type: In-patient   Briana Lynch 08/24/2021, 5:20 PM

## 2021-08-28 ENCOUNTER — Ambulatory Visit: Payer: Self-pay

## 2021-08-28 NOTE — Lactation Note (Signed)
This note was copied from a baby's chart.  NICU Lactation Consultation Note  Patient Name: Briana Lynch [id number] Date: 08/28/2021 Age:26 years   Subjective Reason for consult: Follow-up assessment; Weekly NICU follow-up Mother continues to pump frequently. She denies breast discomfort or difficulty pumping.   Objective Infant data: Mother's Current Feeding Choice: Breast Milk   Maternal data: G1P0101  Vaginal, Spontaneous  Pumping frequency: q3-4h Pumped volume: 60 mL   WIC Program: Yes WIC Referral Sent?: Yes Pump: DEBP, Stork Pump  Assessment Maternal: Milk volume: Normal   Intervention/Plan Interventions: Education  Plan: Consult Status: Follow-up  NICU Follow-up type: Weekly NICU follow up  Mother will continue to pump   Elder Negus 08/28/2021, 5:51 PM

## 2021-08-29 ENCOUNTER — Telehealth (HOSPITAL_COMMUNITY): Payer: Self-pay | Admitting: *Deleted

## 2021-08-29 NOTE — Telephone Encounter (Signed)
Patient voiced no questions or concerns at this time. EPDS=1. Infant remains in NICU. Patient requested RN email information on hospital's virtual postpartum classes and support groups. Email sent. Deforest Hoyles, RN, 08/29/21, [phone]

## 2021-09-04 ENCOUNTER — Ambulatory Visit: Payer: Self-pay

## 2021-09-04 NOTE — Lactation Note (Signed)
This note was copied from a baby's chart. Lactation Consultation Note  Patient Name: Briana Lynch [id number] Date: 09/04/2021 Reason for consult: Follow-up assessment;NICU baby Age:25 wk.o.  Mom stated that she was doing breast massages before pumping. I encouraged her to continue breast massages and pumping q3h.   Mom denies any breast concerns or issues.    Feeding Mother's Current Feeding Choice: Breast Milk   Lactation Tools Discussed/Used Breast pump type: Double-Electric Breast Pump Reason for Pumping: NICU Pumping frequency: q3h Pumped volume: 90 mL  Consult Status Consult Status: Follow-up Date: 09/04/21 Follow-up type: In-patient    Briana Lynch 09/04/2021, 2:22 PM

## 2021-09-09 ENCOUNTER — Encounter: Payer: Self-pay | Admitting: Radiology

## 2021-09-10 ENCOUNTER — Ambulatory Visit: Payer: Self-pay

## 2021-09-10 NOTE — Lactation Note (Signed)
This note was copied from a baby's chart.  NICU Lactation Consultation Note  Patient Name: Boy Jessicamarie Amiri [id number] Date: 09/10/2021 Age:26 y.o.   Subjective Reason for consult: Follow-up assessment; NICU baby; Primapara; 1st time breastfeeding  Lactation followed up. Ms. Starrett was pumping upon entry. She denied need for an additional hands free top. She is going to try to purchase a pumping bra.  She expressed concerns about pumping 60 mls/session. I reviewed pumping frequency, lactogenic foods, and discussed option to power pump one time/day for 4-5 days to stimulate production. I also mentioned moringa as a possible supplement that may support lactation. I encouraged her to research that option and ask questions if she is interested.  Objective Infant data: Mother's Current Feeding Choice: Breast Milk  Infant feeding assessment No data recorded    Maternal data: G1P0101  Vaginal, Spontaneous No data recorded Current breast feeding challenges:: NICU  No data recorded Does the patient have breastfeeding experience prior to this delivery?: No  Pumping frequency: q3 hours Pumped volume: 60 mL  No data recorded  WIC Program: Yes WIC Referral Sent?: Yes Pump: DEBP, Stork Pump  Assessment   Maternal: Milk volume: Normal   Intervention/Plan Interventions: Education  Tools: Pump Pump Education: Setup, frequency, and cleaning  Plan: Consult Status: Follow-up Pump q3 hours. Power pump 1/daily for 4-5 days. Hydrate and eat a lactogenic diet, as able. NICU Follow-up type: Weekly NICU follow up    Walker Shadow 09/10/2021, 3:43 PM

## 2021-09-17 ENCOUNTER — Ambulatory Visit: Payer: Self-pay

## 2021-09-17 NOTE — Lactation Note (Signed)
This note was copied from a baby's chart. ?Lactation Consultation Note ? ?Patient Name: Briana Lynch ?Today's Date: 09/17/2021 ?Reason for consult: Maternal endocrine disorder;Follow-up assessment;NICU baby;Preterm <34wks;1st time breastfeeding;Primapara;Infant < 6lbs ?Age:25 wk.o. ? ?Visited with mom of 35 10/62 weeks old (adjusted) NICU female, she's a P1 and reports that after trying oatmeal and power pumping once/day her supply has doubled; praised her for her efforts. Reviewed galactagogues and strategies to increase her supply. ? ?She had questions regarding breastfeeding and pumping, educated on normalcy of milk supply and her discharge goals per her request. She'd like to exclusively breastfeed once baby is ready; providing with an exclusive breastmilk diet is her ultimate goal; let her know that our team will support her decision in every way we can. ? ?Maternal Data ? Mom's supply has increased and is WNL ? ?Feeding ?Mother's Current Feeding Choice: Breast Milk ? ?Lactation Tools Discussed/Used ?Tools: Pump;Flanges ?Flange Size: 24 ?Breast pump type: Double-Electric Breast Pump ?Pump Education: Setup, frequency, and cleaning;Milk Storage ?Reason for Pumping: pre-term in NICU ?Pumping frequency: 6-7 times/24 hours ?Pumped volume: 120 mL ? ?Interventions ?Interventions: DEBP;Education ? ?Plan of care ?Encouraged mom to start pumping consistently every 3 hours, at least 8 pumping sessions/24 hours ?She'll wake up at least once at night to pump; if she sleeps through her alarm clock she'll power pump the next morning ?  ?No other support person at this time. All questions and concerns answered, mom to call NICU LC PRN. ? ?Discharge ?Pump: DEBP;Stork Pump ? ?Consult Status ?Consult Status: Follow-up ?Date: 09/17/21 ?Follow-up type: In-patient ? ? ?Briana Lynch S Briana Lynch ?09/17/2021, 12:21 PM ? ? ? ?

## 2021-09-19 ENCOUNTER — Ambulatory Visit (INDEPENDENT_AMBULATORY_CARE_PROVIDER_SITE_OTHER): Payer: Commercial Managed Care - PPO | Admitting: Family Medicine

## 2021-09-19 ENCOUNTER — Other Ambulatory Visit: Payer: Self-pay

## 2021-09-19 DIAGNOSIS — F17200 Nicotine dependence, unspecified, uncomplicated: Secondary | ICD-10-CM | POA: Diagnosis not present

## 2021-09-19 MED ORDER — NICOTINE POLACRILEX 2 MG MT GUM: 2.0000 mg | CHEWING_GUM | OROMUCOSAL | 2 refills | Status: DC | PRN

## 2021-09-19 MED ORDER — NICOTINE 21-14-7 MG/24HR TD KIT: 1.0000 | PACK | TRANSDERMAL | 0 refills | Status: DC

## 2021-09-19 NOTE — Progress Notes (Signed)
? ? ?Post Partum Visit Note ? ?Briana Lynch is a 26 y.o. G9P0101 female who presents for a postpartum visit. She is 5 weeks postpartum following a normal spontaneous vaginal delivery.  I have fully reviewed the prenatal and intrapartum course. The delivery was at 23.1 gestational weeks.  Anesthesia: none. Postpartum course has been uneventful. Baby is doing well despite very early gestational age. Baby is feeding by breast. Bleeding no bleeding. Bowel function is normal. Bladder function is normal. Patient is sexually active. Contraception method is condoms. Postpartum depression screening: negative. ? ? ?The pregnancy intention screening data noted above was reviewed. Potential methods of contraception were discussed. The patient elected to proceed with No data recorded. ? ? Edinburgh Postnatal Depression Scale - 09/19/21 0828   ? ?  ? Edinburgh Postnatal Depression Scale:  In the Past 7 Days  ? I have been able to laugh and see the funny side of things. 0   ? I have looked forward with enjoyment to things. 0   ? I have blamed myself unnecessarily when things went wrong. 0   ? I have been anxious or worried for no good reason. 0   ? I have felt scared or panicky for no good reason. 0   ? Things have been getting on top of me. 0   ? I have been so unhappy that I have had difficulty sleeping. 0   ? I have felt sad or miserable. 0   ? I have been so unhappy that I have been crying. 0   ? The thought of harming myself has occurred to me. 0   ? Edinburgh Postnatal Depression Scale Total 0   ? ?  ?  ? ?  ? ? ?Health Maintenance Due  ?Topic Date Due  ? TETANUS/TDAP  03/08/2018  ? ? ?The following portions of the patient's history were reviewed and updated as appropriate: allergies, current medications, past family history, past medical history, past social history, past surgical history, and problem list. ? ?Review of Systems ?Pertinent items noted in HPI and remainder of comprehensive ROS otherwise  negative. ? ?Objective:  ?BP 107/72   Pulse 77   Wt 274 lb (124.3 kg)   LMP  (LMP Unknown)   Breastfeeding Yes   BMI 41.66 kg/m?   ? ?General:  alert, cooperative, and appears stated age  ? Breasts:  not indicated  ?Lungs: Comfortable on room air  ?GU exam:  not indicated  ?     ?Assessment:  ? ? There are no diagnoses linked to this encounter. ? ?Normal postpartum exam.  ? ?Plan:  ? ?Essential components of care per ACOG recommendations: ? ?1.  Mood and well being: Patient with negative depression screening today. Reviewed local resources for support.  ?- Patient tobacco use? Yes. Patient desires to quit? Yes.offered nicotine replacement therapy which was accepted.  ?- hx of drug use? No.   ? ?2. Infant care and feeding:  ?-Patient currently breastmilk feeding? Yes. Reviewed importance of draining breast regularly to support lactation.  ?-Social determinants of health (SDOH) reviewed in EPIC. No concerns ? ?3. Sexuality, contraception and birth spacing ?- Patient does not want a pregnancy in the next year.  Desired family size is unsure..  ?- Reviewed reproductive life planning. Reviewed contraceptive methods based on pt preferences and effectiveness.  Patient desired condoms today. We had a long talk about different methods. She does not want any hormonal method due to intolerable side effects with prior use.  We discussed importance of consistently using barriers, but would also be advisable to use more reliable non-hormonal method such as copper IUD. I showed her anatomical pictures as well as a demonstration kit. She will consider and schedule appt if she decides to go forward with it.  ?- Discussed birth spacing of 18 months as well as high risk of preterm delivery in the future. Discussed she would be candidate for cerclage and vaginal progesterone in subsequent pregnancy. ? ?4. Sleep and fatigue ?-Encouraged family/partner/community support of 4 hrs of uninterrupted sleep to help with mood and  fatigue ? ?5. Physical Recovery  ?- Discussed patients delivery and complications. She describes her labor as mixed. ?- Patient had a Vaginal, no problems at delivery. Patient had no laceration. Perineal healing reviewed. Patient expressed understanding ?- Patient has urinary incontinence? No. ?- Patient is safe to resume physical and sexual activity ? ?6.  Health Maintenance ?- HM due items addressed No - up to date ?- Last pap smear  ?Diagnosis  ?Date Value Ref Range Status  ?05/25/2021   Final  ? - Negative for intraepithelial lesion or malignancy (NILM)  ? Pap smear not done at today's visit.  ?-Breast Cancer screening indicated? No.  ? ?7. Chronic Disease/Pregnancy Condition follow up: None ? ?- PCP follow up ? ?Clarnce Flock, MD ?[facility] ? ?

## 2021-09-29 ENCOUNTER — Ambulatory Visit: Payer: Self-pay

## 2021-09-29 NOTE — Lactation Note (Signed)
This note was copied from a baby's chart. ?Lactation Consultation Note ? ?Patient Name: Briana Lynch ?Today's Date: 09/29/2021 ?Reason for consult: Maternal endocrine disorder;Follow-up assessment;NICU baby;1st time breastfeeding;Primapara;Infant < 6lbs ?Age:25 wk.o. ? ?Visited with mom of 15 67/34 weeks old NICU female, she's a P1 and reports pumping is going well; she was pumping when entered the room, praised her for her efforts. Mom reports an increase on her supply again, she's above target goals, let her know that if she continues to maintain this volumes, this will be a strong indicator for baby to be on an exclusive breastmilk diet after discharge.  ? ?Reviewed strategies to sustain her supply, pumping schedule, volume goals and supply/demand. Mom requested a second pump kit, provided a sealed one for her to take home and use it with her WIC pump. ? ?Maternal Data ? Mom's supply is WNL ? ?Feeding ?Mother's Current Feeding Choice: Breast Milk ? ?Lactation Tools Discussed/Used ?Tools: Pump;Flanges;Coconut oil ?Flange Size: 24 ?Breast pump type: Double-Electric Breast Pump ?Pump Education: Setup, frequency, and cleaning;Milk Storage ?Reason for Pumping: pre-term in NICU ?Pumping frequency: 6-8 times/24 hours ?Pumped volume: 120 mL (120-270 ml) ? ?Interventions ?Interventions: DEBP;Education ? ?Plan of care ?Encouraged mom to continue pumping consistently every 3 hours, at least 8 pumping sessions/24 hours ?She'll wake up at least once at night to pump; if she sleeps through her alarm clock she'll power pump the next morning ?  ?No other support person at this time. All questions and concerns answered, mom to call NICU LC PRN. ? ?Discharge ?Pump: DEBP;Stork Pump, WIC pump ? ?Consult Status ?Consult Status: Follow-up ?Date: 09/29/21 ?Follow-up type: In-patient ? ? ?Jamylah Marinaccio S Panfilo Ketchum ?09/29/2021, 2:56 PM ? ? ? ?

## 2021-10-05 ENCOUNTER — Ambulatory Visit: Payer: Self-pay

## 2021-10-05 NOTE — Lactation Note (Signed)
This note was copied from a baby's chart. ?Lactation Consultation Note ? ?Patient Name: Briana Lynch ?Today's Date: 10/05/2021 ?Reason for consult: Follow-up assessment;NICU baby;Preterm <34wks;Infant < 6lbs ?Age:25 wk.o. ? ?Lactation followed up with Ms. Florido. She reports that she has been trying to pump consistently. She has occasional days when she misses sessions due to family obligations, and she notices that her milk volume drops. She states that she is able to recover with power pumping. Ms. Saldarriaga had a few diy pumping "bras" that became worn out. We discussed the Simple Wishes bra and the lactamed pumping kit as alternatives to holding the flanges on for long-term use. ? ?Ms. Elsbury notices that her greatest milk volume is at her 0300 pump, and she continues to do this session. She is averaging approximately 24 ounces a day. We discussed that this is expected pumping volumes to continue providing breast milk as [name] matures. ? ?Maternal Data ?Does the patient have breastfeeding experience prior to this delivery?: No ? ?Feeding ?Mother's Current Feeding Choice: Breast Milk ? ?Lactation Tools Discussed/Used ?Tools: Pump ?Breast pump type: Double-Electric Breast Pump ?Pump Education: Setup, frequency, and cleaning;Other (comment) (pumping bras) ?Reason for Pumping: NICU ?Pumping frequency: 6 times/daily ?Pumped volume: 120 mL ? ?Interventions ?Interventions: Breast feeding basics reviewed;DEBP;Education ? ?Discharge ?Pump:  (WIC pump) ? ?Consult Status ?Consult Status: Follow-up ?Date: 10/05/21 ?Follow-up type: In-patient ? ? ? ?Lenore Manner ?10/05/2021, 9:47 AM ? ? ? ?

## 2021-10-10 ENCOUNTER — Ambulatory Visit: Payer: Commercial Managed Care - PPO

## 2021-10-12 ENCOUNTER — Encounter (HOSPITAL_COMMUNITY): Payer: Self-pay

## 2021-10-12 ENCOUNTER — Ambulatory Visit (HOSPITAL_COMMUNITY)
Admission: EM | Admit: 2021-10-12 | Discharge: 2021-10-12 | Disposition: A | Discharge status: Home / Self Care | Payer: Commercial Managed Care - PPO | Attending: Family Medicine | Admitting: Family Medicine

## 2021-10-12 DIAGNOSIS — N898 Other specified noninflammatory disorders of vagina: Secondary | ICD-10-CM | POA: Insufficient documentation

## 2021-10-12 DIAGNOSIS — R350 Frequency of micturition: Secondary | ICD-10-CM | POA: Insufficient documentation

## 2021-10-12 LAB — POCT URINALYSIS DIPSTICK, ED / UC
Bilirubin Urine: NEGATIVE
Glucose, UA: NEGATIVE mg/dL
Hgb urine dipstick: NEGATIVE
Ketones, ur: NEGATIVE mg/dL
Leukocytes,Ua: NEGATIVE
Nitrite: NEGATIVE
Protein, ur: NEGATIVE mg/dL
Specific Gravity, Urine: 1.025 (ref 1.005–1.030)
Urobilinogen, UA: 0.2 mg/dL (ref 0.0–1.0)
pH: 7 (ref 5.0–8.0)

## 2021-10-12 LAB — HIV ANTIBODY (ROUTINE TESTING W REFLEX): HIV Screen 4th Generation wRfx: NONREACTIVE

## 2021-10-12 LAB — POC URINE PREG, ED: Preg Test, Ur: NEGATIVE

## 2021-10-12 NOTE — ED Provider Notes (Signed)
?[facility] ? ? ? ?CSN: [id number] ?Arrival date & time: 10/12/21  4585 ? ? ?  ? ?History   ?Chief Complaint ?Chief Complaint  ?Patient presents with  ? Dysuria  ? Vaginal Discharge  ? ? ?HPI ?Briana Lynch is a 26 y.o. female.  ? ?Patient is here for vaginal d/c x several days.  Not a lot, but leaves a stain.  Yellow in color.  No vaginal itching or pain.  ?She also thinks she has a UTI.  She did have a baby 2 months ago.  Started with symptoms x 1 week, urinary frequency.  Some back pain, some cramping.  No dysuria noted.  ?Sexually active, 1 partner.  Condom use prn.  Not currently on birth control.  ?No known std exposure.  ?Lmp was 2 weeks ago ? ?Past Medical History:  ?Diagnosis Date  ? Ankle sprain and strain 11/15/2010  ? Anxiety state 12/09/2012  ? Depression   ? PCOS (polycystic ovarian syndrome)   ? ? ?Patient Active Problem List  ? Diagnosis Date Noted  ? Preterm delivery 08/18/2021  ? Cervical incompetence 08/17/2021  ? Caustic injury gastritis   ? Suicide attempt ([address]) 10/23/2020  ? 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 Iron deficiency anemia 10/19/2014  ? Vitamin D deficiency 08/20/2014  ? Prediabetes 08/20/2014  ? PCOS (polycystic ovarian syndrome) 03/03/2013  ? Urinary incontinence 12/11/2012  ? ? ?Past Surgical History:  ?Procedure Laterality Date  ? ESOPHAGOGASTRODUODENOSCOPY (EGD) WITH PROPOFOL N/A 10/24/2020  ? Procedure: ESOPHAGOGASTRODUODENOSCOPY (EGD) WITH PROPOFOL;  Surgeon: Jerene Bears, MD;  Location: [facility];  Service: Gastroenterology;  Laterality: N/A;  ? ? ?OB History   ? ? Gravida  ?1  ? Para  ?1  ? Term  ?   ? Preterm  ?1  ? AB  ?   ? Living  ?1  ?  ? ? SAB  ?   ? IAB  ?   ? Ectopic  ?   ? Multiple  ?0  ? Live Births  ?1  ?   ?  ?  ? ? ? ?Home Medications   ? ?Prior to Admission medications   ?Medication Sig Start Date End Date Taking? Authorizing Provider  ?Nicotine 21-14-7 MG/24HR KIT Place 1 patch onto the skin as directed. 09/19/21   Clarnce Flock, MD  ?nicotine polacrilex (NICORETTE) 2 MG gum Take 1 each (2 mg total) by mouth as needed for smoking cessation. 09/19/21   Clarnce Flock, MD  ?Prenatal MV & Min w/FA-DHA (PRENATAL GUMMIES) 0.18-25 MG CHEW Chew by mouth.    [provider]  ? ? ?Family History ?Family History  ?Problem Relation Age of Onset  ? Diabetes Mother   ? Hypertension Mother   ? Mental illness Sister   ? ? ?Social History ?Social History  ? ?Tobacco Use  ? Smoking status: Every Day  ?  Packs/day: 0.50  ?  Types: Cigarettes  ? Smokeless tobacco: Never  ? Tobacco comments:  ?  Dad smokes  ?Vaping Use  ? Vaping Use: Never used  ?Substance Use Topics  ? Alcohol use: Not Currently  ?  Alcohol/week: 0.0 standard drinks  ? Drug use: Yes  ?  Types: Marijuana  ?  Comment: infrequent  ? ? ? ?Allergies   ?Penicillins ? ? ?Review of Systems ?Review of Systems  ?Constitutional: Negative.   ?HENT: Negative.    ?Respiratory: Negative.    ?Cardiovascular: Negative.   ?Gastrointestinal:  Negative.   ?Genitourinary:  Positive for dysuria and vaginal discharge.  ? ? ?Physical Exam ?Triage Vital Signs ?ED Triage Vitals [10/12/21 1039]  ?Enc Vitals Group  ?   BP 101/68  ?   Pulse Rate 78  ?   Resp 18  ?   Temp 97.8 ?F (36.6 ?C)  ?   Temp Source Oral  ?   SpO2 100 %  ?   Weight   ?   Height   ?   Head Circumference   ?   Peak Flow   ?   Pain Score   ?   Pain Loc   ?   Pain Edu?   ?   Excl. in [place]?   ? ?No data found. ? ?Updated Vital Signs ?BP 101/68 (BP Location: Left Arm)   Pulse 78   Temp 97.8 ?F (36.6 ?C) (Oral)   Resp 18   LMP  (LMP Unknown)   SpO2 100%  ? ?Visual Acuity ?Right Eye Distance:   ?Left Eye Distance:   ?Bilateral Distance:   ? ?Right Eye Near:   ?Left Eye Near:    ?Bilateral Near:    ? ?Physical Exam ?Constitutional:   ?   Appearance: Normal appearance.  ?Cardiovascular:  ?   Rate and Rhythm: Normal rate and regular rhythm.  ?Pulmonary:  ?   Effort: Pulmonary effort is normal.  ?   Breath sounds: Normal breath sounds.   ?Abdominal:  ?   Palpations: Abdomen is soft.  ?   Tenderness: There is no abdominal tenderness.  ?Musculoskeletal:  ?   Cervical back: Normal range of motion.  ?Neurological:  ?   Mental Status: She is alert.  ? ? ? ?UC Treatments / Results  ?Labs ?(all labs ordered are listed, but only abnormal results are displayed) ?Labs Reviewed  ?RPR  ?HIV ANTIBODY (ROUTINE TESTING W REFLEX)  ?POCT URINALYSIS DIPSTICK, ED / UC  ?POC URINE PREG, ED  ?CERVICOVAGINAL ANCILLARY ONLY  ? ? ?EKG ? ? ?Radiology ?No results found. ? ?Procedures ?Procedures (including critical care time) ? ?Medications Ordered in UC ?Medications - No data to display ? ?Initial Impression / Assessment and Plan / UC Course  ?I have reviewed the triage vital signs and the nursing notes. ? ?Pertinent labs & imaging results that were available during my care of the patient were reviewed by me and considered in my medical decision making (see chart for details). ? ?  ?Final Clinical Impressions(s) / UC Diagnoses  ? ?Final diagnoses:  ?Vaginal discharge  ?Urinary frequency  ? ? ? ?Discharge Instructions   ? ?  ?You were seen today for vaginal discharge and urinary frequency.  ?Your vaginal swab will be resulted tomorrow.  We will hold off treatment for now, but if anything is positive we will call to notify you to treat.  Lab work will be resulted as well in the next 24 hrs.  ?Your urine was normal today.  No sign of infection.   ?If you continue with your symptoms please follow up with your primary care provider for further testing.  ? ? ? ?ED Prescriptions   ?None ?  ? ?PDMP not reviewed this encounter. ?  Rondel Oh, MD ?10/12/21 1132 ? ?

## 2021-10-12 NOTE — ED Triage Notes (Signed)
Pt presents with yellowish vaginal discharge for several days. She reports some dysuria since having her baby 2 months ago. ?

## 2021-10-12 NOTE — Discharge Instructions (Addendum)
You were seen today for vaginal discharge and urinary frequency.  ?Your vaginal swab will be resulted tomorrow.  We will hold off treatment for now, but if anything is positive we will call to notify you to treat.  Lab work will be resulted as well in the next 24 hrs.  ?Your urine was normal today.  No sign of infection.   ?If you continue with your symptoms please follow up with your primary care provider for further testing.  ?

## 2021-10-13 ENCOUNTER — Telehealth (HOSPITAL_COMMUNITY): Payer: Self-pay | Admitting: Emergency Medicine

## 2021-10-13 ENCOUNTER — Ambulatory Visit: Payer: Self-pay

## 2021-10-13 LAB — CERVICOVAGINAL ANCILLARY ONLY
Bacterial Vaginitis (gardnerella): POSITIVE — AB
Candida Glabrata: NEGATIVE
Candida Vaginitis: NEGATIVE
Chlamydia: NEGATIVE
Comment: NEGATIVE
Comment: NEGATIVE
Comment: NEGATIVE
Comment: NEGATIVE
Comment: NEGATIVE
Comment: NORMAL
Neisseria Gonorrhea: NEGATIVE
Trichomonas: NEGATIVE

## 2021-10-13 LAB — RPR: RPR Ser Ql: NONREACTIVE

## 2021-10-13 MED ORDER — METRONIDAZOLE 0.75 % VA GEL: 1.0000 | Freq: Every day | VAGINAL | 0 refills | Status: AC

## 2021-10-13 NOTE — Lactation Note (Signed)
This note was copied from a baby's chart. ? ?NICU Lactation Consultation Note ? ?Patient Name: Briana Lynch ?Today's Date: 10/13/2021 ?Age:25 wk.o. ? ? ?Subjective ?Reason for consult: Follow-up assessment ?Mother continues to pump often. She is taking a 6-hour break at night to sleep. The break does not cause her discomfort or over-fullness. We reviewed IDF algorithm when baby is ready.  ? ?Mother will return to work next week.  ? ?Mother would like to establish bf'ing prior to introduction of a bottle. ? ?Objective ?Infant data: ?Mother's Current Feeding Choice: Breast Milk ? ?Infant feeding assessment ?Scale for Readiness: 3 ? ? ?  ?Maternal data: ?G1P0101  ?Vaginal, Spontaneous ?Pumping frequency: 90-200+mL 6xday ? ? ?WIC Program: Yes ?WIC Referral Sent?: Yes ?Pump:  (WIC pump) ? ?Assessment ?Maternal: ?Milk volume: Normal ? ? ?Intervention/Plan ?Interventions: Education; Infant Driven Feeding Algorithm education ? ?Plan: ?Consult Status: Follow-up ? ?NICU Follow-up type: Weekly NICU follow up ? ?Continue current pumping routine.  ? ?Elder Negus ?10/13/2021, 11:11 AM ?

## 2021-10-21 ENCOUNTER — Ambulatory Visit: Payer: Self-pay

## 2021-10-21 NOTE — Lactation Note (Signed)
This note was copied from a baby's chart. ?Lactation Consultation Note ? ?Patient Name: Briana Lynch ?Today's Date: 10/21/2021 ?Reason for consult: Follow-up assessment;Primapara;1st time breastfeeding;NICU baby;Preterm <34wks;Infant < 6lbs;Maternal endocrine disorder ?Age:26 years ? ?Visited with mom of 46 38/61 weeks old (adjusted) NICU female, she reports that her supply has slightly dwindled since she went back to work on 10/16/21. [name] voiced that she works from home though and that she might consider purchasing a hands-free pump to have less interruptions during her work day.  ? ?Her pumping sessions and now shorter but she's trying to pump more often during the day. Reviewed some options and recommendations for wearable pumps, she's still using her stork pump and also her WIC pump.   ? ?Maternal Data ? Mom's supply is WNL ? ?Feeding ?Mother's Current Feeding Choice: Breast Milk ? ?Lactation Tools Discussed/Used ?Tools: Pump;Flanges ?Flange Size: 24 ?Breast pump type: Double-Electric Breast Pump ?Pump Education: Setup, frequency, and cleaning;Milk Storage ?Reason for Pumping: pre-term in NICU ?Pumping frequency: 6-8 times/24 hours ?Pumped volume: 90 mL (90-120 ml (210-240 ml w/power pumping in the AM)) ? ?Interventions ?Interventions: DEBP;Education ? ?Plan of care ?Encouraged mom to continue pumping consistently every 3 hours, at least 8 pumping sessions/24 hours ?She'll wake up at least once at night to pump; if she sleeps through her alarm clock she'll power pump the next morning ?  ?No other support person at this time. All questions and concerns answered, mom to call NICU LC PRN. ? ?Discharge ?Pump: DEBP;Stork Pump;Personal (WIC pump) ? ?Consult Status ?Consult Status: Follow-up ?Date: 10/21/21 ?Follow-up type: In-patient ? ? ?Briana Lynch ?10/21/2021, 6:03 PM ? ? ? ?

## 2021-10-27 ENCOUNTER — Ambulatory Visit: Payer: Self-pay

## 2021-10-27 NOTE — Lactation Note (Signed)
This note was copied from a baby's chart. ?Telephone call ? ?Patient Name: Briana Lynch ?Today's Date: 10/27/2021 ?Reason for consult: Maternal endocrine disorder;NICU baby;Infant < 6lbs;1st time breastfeeding;Primapara;Follow-up assessment;Other (Comment);Preterm <34wks (Telephone call) ?Age:25 m.o. ? ?LC in the room to visit with mom but she hasn't come to the hospital yet, she's been coming later in the day to spend the night with baby "Briana Lynch" since she returned to work. Ms. Wafer returned phone call from [facility] and reported that pumping is going well although her supply has slightly decreased. She notices a better output with the hospital grade pump, advised to stick to it if that's the one is giving her the best results. Encouraged to continue pumping at least 8 times/24 hours and to power pump in the AM. ? ?All questions and concerns answered, Ms. Toothman is planning on visiting the unit again this weekend, asked to to let baby's RN know to call NICU lactation the next time she comes to the unit, will F/U in-person. ? ?Maternal Data ? Maternal supply has slightly dwindled but it's still WNL ? ?Feeding ?Mother's Current Feeding Choice: Breast Milk ? ?Lactation Tools Discussed/Used ?Tools: Pump;Flanges ?Flange Size: 24 ?Breast pump type: Double-Electric Breast Pump ?Pump Education: Setup, frequency, and cleaning;Milk Storage ?Reason for Pumping: pre-term in NICU ?Pumping frequency: 8 times/24 hours ?Pumped volume: 90 mL (120-210 ml w/power pumping in the AM) ? ?Interventions ?Interventions: Education ? ?Discharge ?Pump: DEBP;Personal;Stork Pump (WIC pump) ? ?Consult Status ?Consult Status: Follow-up ?Date: 10/27/21 ?Follow-up type: In-patient ? ? ?Oris Calmes S Jonmichael Beadnell ?10/27/2021, 5:36 PM ? ? ? ?

## 2021-11-02 ENCOUNTER — Ambulatory Visit: Payer: Self-pay

## 2021-11-02 NOTE — Lactation Note (Signed)
This note was copied from a baby's chart. ? ?NICU Lactation Consultation Note ? ?Patient Name: Briana Lynch ?Today's Date: 11/02/2021 ?Age:25 m.o. ? ? ?Subjective ?Reason for consult: Follow-up assessment; Mother's request ?LC assisted with positioning and reviewed feeding expectations at 34+0 weeks. Mother was pleased with infant's attempt. We discontinued attempt and placed infant sts when RR / HR increased. ? ?Mother is aware of LC support prn. ? ?Objective ?Infant data: ?Mother's Current Feeding Choice: Breast Milk ? ?Infant feeding assessment ?Scale for Readiness: 3 ? ?  ?Maternal data: ?G1P0101  ?Vaginal, Spontaneous ?Pumping frequency: 6+ x day ?Pumped volume: 120 mL ? ? ?WIC Program: Yes ?WIC Referral Sent?: Yes ?Pump: DEBP, Personal, Stork Pump (WIC pump) ? ?Assessment ?Infant: ?LATCH Score: 6 ? ?Infant showed interest in bf'ing and was positioned well. ? ?Maternal: ?Milk volume: Normal ? ? ?Intervention/Plan ?Interventions: Education; Position options; Pre-pump if needed ? ?Plan: ?Consult Status: Follow-up ? ?NICU Follow-up type: Assist with IDF-2 (Mother does not need to pre-pump before breastfeeding); Weekly NICU follow up; Assist with IDF-1 (Mother to pre-pump before breastfeeding) ? ?Mother to continue bf'ing attempts and discontinue if HR/RR increase beyond normal limits. LC team to assist and mark progress.  ? ?Elder Negus ?11/02/2021, 6:10 PM ?

## 2021-11-07 ENCOUNTER — Ambulatory Visit: Payer: Self-pay

## 2021-11-07 NOTE — Lactation Note (Signed)
This note was copied from a baby's chart. ?Lactation Consultation Note ? ?Patient Name: Briana Lynch ?Today's Date: 11/07/2021 ?  ?Age:25 m.o. ? ?Subjective  ?  ?Mom actively pumping at time of visit. Mom stated baby recently put back on CPAP machines and has been on for the past day.  ?Mom is pumping 3- 9x day and getting about . Mom's plan is to breast feed and put baby to breast when he is able to do so.   ? ?Consult Status ?  ?Continue current POC  ?Will plan follow up visit  ? ?Audelia Acton ?11/07/2021, 12:01 PM ? ? ? ?

## 2021-11-14 ENCOUNTER — Ambulatory Visit: Payer: Self-pay

## 2021-11-14 NOTE — Lactation Note (Signed)
This note was copied from a baby's chart. ? ?NICU Lactation Consultation Note ? ?Patient Name: Briana Lynch ?Today's Date: 11/14/2021 ?Age:26 y.o. ? ?Subjective ?Reason for consult: Follow-up assessment; Primapara; 1st time breastfeeding; NICU baby; Maternal endocrine disorder; Late-preterm 34-36.6wks ? ?Visited with mom of 25 25/72 weeks old (adjusted) NICU female, she's a P1 and reports a decrease in her supply; Briana Lynch said it has been going down since she went back to work. Reviewed some strategies to increase supply; advised her to contact her OBGYN for an Rx for Reglan if necessary. Briana Lynch also voiced that baby "Briana Lynch" is already showing feeding cues and try to "bite her" the last time they did STS; she didn't attempt to latch at that time because he started coughing. Asked mom to call for latch assistance when needed. ? ?Objective ?Infant data: ?Mother's Current Feeding Choice: Breast Milk ? ?Infant feeding assessment ?Scale for Readiness: 3 ? ?Maternal data: ?G1P0101  ?Vaginal, Spontaneous ?Pumping frequency: 6-7 times/24 hours ?Pumped volume: 30 mL (30-60 ml; up to 90 ml in the AM) ?Flange Size: 24 ?WIC Program: Yes ?WIC Referral Sent?: Yes ?Pump: DEBP, Personal, Stork Pump (WIC pump) ? ?Assessment ?Maternal: ?Milk volume: Low ? ?Intervention/Plan ?Interventions: DEBP; Education ?Tools: Pump; Flanges ?Pump Education: Setup, frequency, and cleaning; Milk Storage ? ?Plan of care: ?Encouraged mom to continue pumping consistently every 3 hours, at least 8 pumping sessions/24 hours ?She'll continue power pumping in the AM ?She'll try a galactogogue ?  ?No other support person at this time. All questions and concerns answered, mom to call NICU LC PRN. ? ?Consult Status: Follow-up ? ?NICU Follow-up type: Weekly NICU follow up ? ? ?Jaz Laningham S Lorelle Macaluso ?11/14/2021, 6:15 PM ?

## 2021-11-22 ENCOUNTER — Ambulatory Visit: Payer: Self-pay

## 2021-11-22 NOTE — Lactation Note (Signed)
This note was copied from a baby's chart. ? ?NICU Lactation Consultation Note ? ?Patient Name: Briana Lynch ?Today's Date: 11/22/2021 ?Age:26 years ? ? ?Subjective ?Reason for consult: Follow-up assessment ?Mother continues to pump frequently and without difficulty. She would like to challenge at the breast when infant is ready. We discussed IDF algorithm.  ? ?Objective ?Infant data: ?Mother's Current Feeding Choice: Breast Milk ? ?Infant feeding assessment ?Scale for Readiness: 3 ? ?Maternal data: ?G1P0101  ?Vaginal, Spontaneous ? ?Pumping frequency: q3 ?Pumped volume: 75 mL ? ? ?WIC Program: Yes ?WIC Referral Sent?: Yes ?Pump: DEBP, Personal, Stork Pump (WIC pump) ? ?Assessment ?Maternal: ?Milk volume: Normal ? ? ?Intervention/Plan ?Interventions: Visual merchandiser education; Education ?Plan: ?Consult Status: Follow-up ? ?NICU Follow-up type: Weekly NICU follow up ? ?Mother to continue pumping q3h ? ?Elder Negus ?11/22/2021, 3:53 PM ?

## 2021-11-30 ENCOUNTER — Ambulatory Visit: Payer: Self-pay

## 2021-11-30 NOTE — Lactation Note (Signed)
This note was copied from a baby's chart.  NICU Lactation Consultation Note  Patient Name: Briana Lynch [id number] Date: 11/30/2021 Age:25 m.o.  Subjective Reason for consult: Follow-up assessment; Primapara; 1st time breastfeeding; NICU baby; Early term 37-38.6wks; Maternal endocrine disorder  Visited with mom of 32 52/20 weeks old (adjusted); for a feeding assist but she was already giving a bottle when LC entered the room. LC and NICU RN Germaine Pomfret explained to mom that baby "Briana Lynch" can't do both at this point on the same feeding (breast and bottle) and that we'll have to reschedule the feeding assist; Ms. Kasparek voiced understanding. She'll call NICU LC the next time she's ready to take baby to breast. Mom report pumping is going well but that it's been a while since she power pump. Noticed her supply has slightly dropped; reviewed strategies to increase it; especially now that baby is going into IDF 2.  Objective Infant data: Mother's Current Feeding Choice: Breast Milk  Infant feeding assessment Scale for Readiness: 2 Scale for Quality: 2  Maternal data: G1P0101  Vaginal, Spontaneous Pumping frequency: 6 times/24 hours Pumped volume: 90 mL Flange Size: 24 WIC Program: Yes WIC Referral Sent?: Yes Pump: DEBP, Personal, Stork Pump (WIC pump)  Assessment Maternal: Milk volume: Low  Intervention/Plan Interventions: DEBP; Visual merchandiser education; Education Tools: Pump; Flanges Pump Education: Setup, frequency, and cleaning; Milk Storage  Plan of care: Encouraged mom to continue pumping consistently every 3-4 hours, at least 6-8 pumping sessions/24 hours She'll re-start power pumping in the AM She'll call for feeding assist to take baby "Briana Lynch" to breast.   No other support person at this time. All questions and concerns answered, mom to call NICU LC PRN.  Consult Status: Follow-up NICU Follow-up type: Weekly NICU follow up; Assist with IDF-2 (Mother does  not need to pre-pump before breastfeeding)   Shila Kruczek Venetia Constable 11/30/2021, 6:10 PM

## 2021-12-01 ENCOUNTER — Ambulatory Visit: Payer: Self-pay

## 2021-12-01 NOTE — Lactation Note (Signed)
This note was copied from a baby's chart.  NICU Lactation Consultation Note  Patient Name: Briana Lynch [id number] Date: 12/01/2021 Age:25 m.o.   Subjective Reason for consult: Follow-up assessment Mother and infant attempted bf'ing at 6pm feeding time. Infant showed minimal interest and was bottle fed afterwards. Mother requests LC return tomorrow to further assist. We reviewed feeding norms and importance of patience and practice.   Objective Infant data: Mother's Current Feeding Choice: Breast Milk  Infant feeding assessment Scale for Readiness: 2 Scale for Quality: 3     Maternal data: G1P0101  Vaginal, Spontaneous  Pumping frequency: 6 times/24 hours Pumped volume: 90 mL Flange Size: 24   WIC Program: Yes WIC Referral Sent?: Yes Pump: DEBP, Personal, Stork Pump (WIC pump)  Assessment Infant: LATCH Score: 5   Maternal: Milk volume: Low   Intervention/Plan Interventions: Infant Driven Feeding Algorithm education; Education  Tools: Pump; 27F feeding tube / Syringe Pump Education: Setup, frequency, and cleaning; Milk Storage  Plan: Consult Status: Follow-up  NICU Follow-up type: Assist with IDF-2 (Mother does not need to pre-pump before breastfeeding)  Mother to continue pumping and practicing with infant on bf'ing during feeding times.   Gwynne Edinger 12/01/2021, 6:20 PM

## 2021-12-02 ENCOUNTER — Ambulatory Visit: Payer: Self-pay

## 2021-12-02 NOTE — Lactation Note (Signed)
This note was copied from a baby's chart. Lactation Consultation Note  Patient Name: Briana Lynch [id number] Date: 12/02/2021   Age:25 m.o.  LC in the room for 3 pm feeding assist but NICU RN Tresa Endo let [facility] know that Ms. Plata had already left for the day. She called her and Ms. Willhite said she forgot about this feeding, RN Tresa Endo will assist with a bottle feeding for this 3 pm feeding; mom is aware that she can reschedule with LC for a feeding assist anytime.  Feeding Nipple Type: Nfant Extra Slow Flow (gold)   Caridad Silveira S Carisha Kantor 12/02/2021, 3:08 PM

## 2021-12-03 ENCOUNTER — Ambulatory Visit: Payer: Self-pay

## 2021-12-03 NOTE — Lactation Note (Signed)
This note was copied from a baby's chart. Lactation Consultation Note  Patient Name: Briana Lynch AJOIN'O Date: 12/03/2021 Reason for consult: Follow-up assessment;Maternal endocrine disorder;Primapara;1st time breastfeeding;NICU baby;Breastfeeding assistance;Early term 37-38.6wks Age:25 m.o.  LC in the room for feeding assist, baby "Briana Lynch" was awake upon arrival but not showing any feeding cues yet. Ms. Rosen requested to try the football hold for this time but baby would not latch (see LATCH score). Reviewed feeding cues, IDF and infant feeding. Ms. Lahmann voiced that baby has been drinking up to 20 ml per mouth but still working on increasing his volumes. Suggested to possibly try a NS the next time he goes to breast; she seems to like the bottle and his paci.  Feeding Mother's Current Feeding Choice: Breast Milk Nipple Type: Dr. Lorne Skeens  LATCH Score Latch: Too sleepy or reluctant, no latch achieved, no sucking elicited. (minimal feeding cues, baby would no open his mouth to latch on mother's breast, he would eventually suck on gloved finger if gently inserted into this mouth)  Audible Swallowing: None  Type of Nipple: Everted at rest and after stimulation  Comfort (Breast/Nipple): Soft / non-tender  Hold (Positioning): Assistance needed to correctly position infant at breast and maintain latch.  LATCH Score: 5  Lactation Tools Discussed/Used Tools: Pump;Flanges Flange Size: 24 Breast pump type: Double-Electric Breast Pump Pump Education: Setup, frequency, and cleaning;Milk Storage Reason for Pumping: ETI in NICU Pumping frequency: 6 times/24 hours Pumped volume: 90 mL (120 ml in the AM with power pumping)  Interventions Interventions: DEBP;Education;Infant Driven Feeding Algorithm education  Plan of care Encouraged mom to continue pumping consistently every 3-4 hours, at least 6-8 pumping sessions/24 hours She'll continue power pumping in the AM She'll call  for feeding assist to take baby "Briana Lynch" to breast.   No other support person at this time. All questions and concerns answered, mom to call NICU LC PRN.  Discharge Pump: DEBP;Stork Pump;Personal (WIC pump)  Consult Status Consult Status: Follow-up Date: 12/03/21 Follow-up type: In-patient   Briana Lynch 12/03/2021, 3:25 PM

## 2021-12-04 ENCOUNTER — Ambulatory Visit: Payer: Self-pay

## 2021-12-04 NOTE — Lactation Note (Signed)
This note was copied from a baby's chart.  NICU Lactation Consultation Note  Patient Name: Briana Lynch [id number] Date: 12/04/2021 Age:26 years old   Subjective Reason for consult: Follow-up assessment; Mother's request; NICU baby; Preterm <34wks; 1st time breastfeeding; Primapara; Infant < 6lbs  Lactation followed up with Briana Lynch to assist with a breastfeeding attempt. I placed a size 20 NS onto the right breast. We placed baby Briana Lynch in cradle hold; I instructed Briana Lynch to hold her breast in a "U" hold. Baby held NS in mouth with no suckling elicited. Briana Lynch asked if we could remove the NS. I did so, and I instructed her to HE her milk. Baby licked the milk from the breast and showed some precursors to latching. Latch was not achieved.  The RN, Briana Lynch, provided a prepared bottle and recommended giving it to baby after the attempt. I noted that baby was initially uncoordinated and sleepy with the bottle. He showed some suckling bursts with the bottle nipple after a few minutes, but in general, also appeared to be sleepy with the bottle. I recommended that Briana Lynch follow up with her SLP to work on bottle feeding skills.   As a plan, I encouraged Briana Lynch to continue to provide Briana Lynch opportunities to latch and orient to the breast. I recommended in follow up that we attempt again with the NS, but to add in some EBM with the NS to stimulate baby at the breast.  Objective Infant data: Mother's Current Feeding Choice: Breast Milk  Infant feeding assessment Scale for Readiness: 3 Scale for Quality: 2  Maternal data: G1P0101  Vaginal, Spontaneous  Current breast feeding challenges:: NICU; preterm   Does the patient have breastfeeding experience prior to this delivery?: No  Pumping frequency: 6 times/24 hours Pumped volume: 90 mL (120 ml in the AM with power pumping) Flange Size: 24   WIC Program: Yes WIC Referral Sent?: Yes Pump: DEBP, Stork Pump, Personal (WIC  pump)  Assessment Infant: LATCH Score: 5   Maternal: Milk volume: Low   Intervention/Plan Interventions: Breast feeding basics reviewed; Assisted with latch; Adjust position; Support pillows; Education  Tools: Nipple Shields Pump Education: Setup, frequency, and cleaning; Milk Storage Nipple shield size: 20  Plan: Consult Status: Follow-up  NICU Follow-up type: Weekly NICU follow up    Briana Lynch 12/04/2021, 3:57 PM

## 2021-12-12 ENCOUNTER — Ambulatory Visit: Payer: Self-pay

## 2021-12-12 NOTE — Lactation Note (Signed)
This note was copied from a baby's chart. Lactation Consultation Note Lactation attempted to follow up with Ms. Kohan, but she was not present at time of rounding. Lactation will attempt to follow up again this week.

## 2021-12-13 ENCOUNTER — Ambulatory Visit: Payer: Self-pay

## 2021-12-13 NOTE — Lactation Note (Signed)
This note was copied from a baby's chart. Lactation Consultation Note  Patient Name: Briana Lynch [id number] Date: 12/13/2021 Reason for consult: Follow-up assessment;Mother's request;NICU baby;Breastfeeding assistance;Weekly NICU follow-up;Term;Primapara;1st time breastfeeding;Maternal endocrine disorder Age:25 m.o.  Visited with mom of 28 79/52 weeks old (adjusted) NICU female twice, first to answer some questions and then at noon for feeding assist.[name] had some questions regarding her breastmilk, she threw away 7 bottles of breastmilk because it had an "odd color" and she thought it smell like soap. Verified storage guidelines and they're all correct; explained to [name] that the odd color/smell was probably due to the lipase enzyme breaking down the lipid components in her breastmilk. Asked her not to discard it again unless it is truly expired. She's taking some time off from work in order to spend more time with baby to work on both, bottle feedings and breastfeeding.   LC came back to assist with the 12 pm feeding; Ms. Jaffee reported that she's been taking baby Briana Lynch to breast almost daily for about 5 minutes. She hasn't used the NS # 20; she said she doesn't like it. LC took baby Briana Lynch to mother's L breast in cross cradle hold but he wouldn't latch (see LATCH score)> Asked mom to call for assistance when needed. Reviewed some tips for latching and positioning and benefits of STS care.  Maternal Data  Maternal supply has slightly dwindled and is [employer] Mother's Current Feeding Choice: Breast Milk  LATCH Score Latch: Too sleepy or reluctant, no latch achieved, no sucking elicited. (baby awake and alert but fussy, will not latch, able to position nipple in his mouth but no sucking reflex elicited; will no suck on gloved finger either, he kept crying.)  Audible Swallowing: None  Type of Nipple: Everted at rest and after stimulation  Comfort (Breast/Nipple): Soft /  non-tender  Hold (Positioning): Assistance needed to correctly position infant at breast and maintain latch.  LATCH Score: 5  Lactation Tools Discussed/Used Tools: Pump;Flanges Flange Size: 24 Breast pump type: Double-Electric Breast Pump Pump Education: Setup, frequency, and cleaning;Milk Storage Reason for Pumping: NICU Pumping frequency: 6-8 times/24 hours Pumped volume: 90 mL  Interventions Interventions: Hand express;Breast compression;DEBP;Adjust position;Support pillows;Assisted with latch;Education  Plan of care Encouraged mom to continue pumping consistently every 3-4 hours, at least 6-8 pumping sessions/24 hours She'll call for feeding assist to take baby "Briana Lynch" to breast; will continue taking to breast on feeding cues She'll continue working on bottle feedings   No other support person at this time. All questions and concerns answered, mom to call NICU LC PRN.  Consult Status Consult Status: NICU follow-up Date: 12/13/21 Follow-up type: In-patient   Maidie Streight Venetia Constable 12/13/2021, 2:34 PM

## 2022-01-26 ENCOUNTER — Ambulatory Visit: Payer: Self-pay

## 2022-01-26 NOTE — Lactation Note (Signed)
This note was copied from a baby's chart.  NICU Lactation Consultation Note  Patient Name: Briana Lynch [id number] Date: 01/26/2022 Age:25 m.o.   Subjective Reason for consult: Follow-up assessment  Lactation rounded on Ms. Pipe and baby Briana Lynch. We discussed her feeding goals, and she states that baby is being breast and bottle fed. She states that she would like to breastfeed.  She states that at [facility], she was told to limit to one breastfeeding session/day. Ms. Huntoon states that baby is latching much better, and that while at [facility], a provider conducted a frenectomy.  I recommended that lactation and speech therapy meet with Ms. Petronio to gather a detailed history and begin to discuss feeding goals.  Ms. Spinney states that she has all needed supplies for breast pumping. She plans to stay with baby as much as possible and is available on 7/15 for follow up.   Objective   Maternal data: G1P0101  Vaginal, Spontaneous  Current breast feeding challenges:: NICU  Pumping frequency: 6 times/day Pumped volume: 45 mL (45-90 mls/session)   Pump: Personal (mom cozy)  Intervention/Plan  Tools: Pump Pump Education: Setup, frequency, and cleaning  Plan: Consult Status: NICU follow-up  NICU Follow-up type: New admission follow up    Walker Shadow 01/26/2022, 6:02 PM

## 2022-07-16 IMAGING — US US OB COMP LESS 14 WK
1 series · 15 of 27 positions shown · non-contrast
Comparison: None.

CLINICAL DATA: Bleeding in early pregnancy.

EXAM:
OBSTETRIC <14 WK ULTRASOUND
TECHNIQUE: Transabdominal ultrasound was performed for evaluation of the
gestation as well as the maternal uterus and adnexal regions.

[Series 1: us ob comp less 14 wk · 15 of 27 slices shown]
[im 1/27]
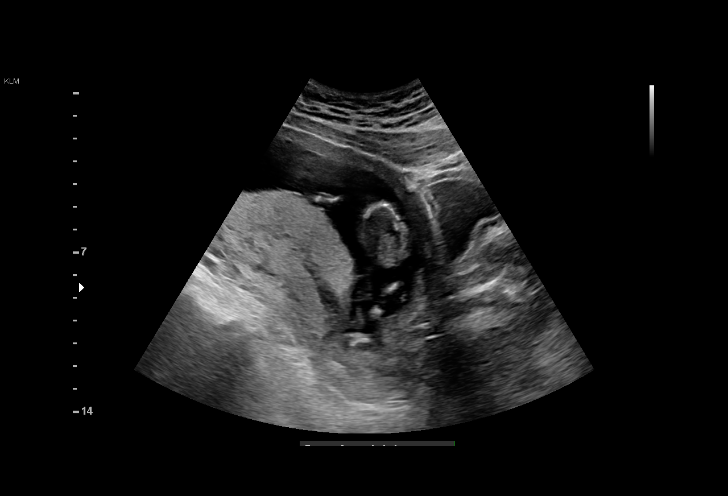
[im 3/27]
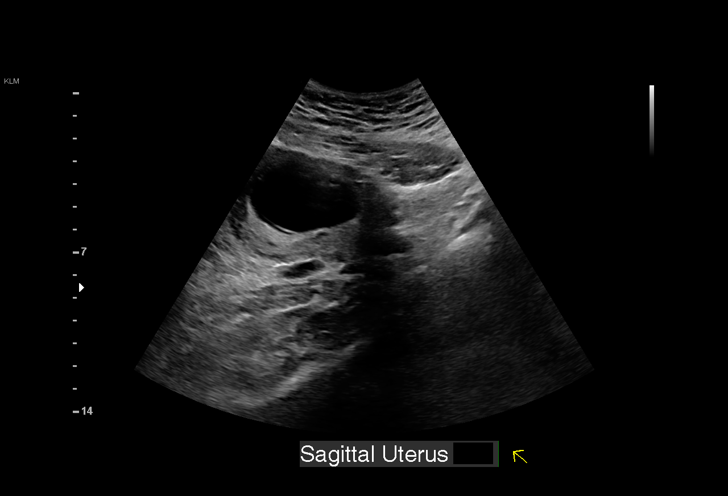
[im 5/27]
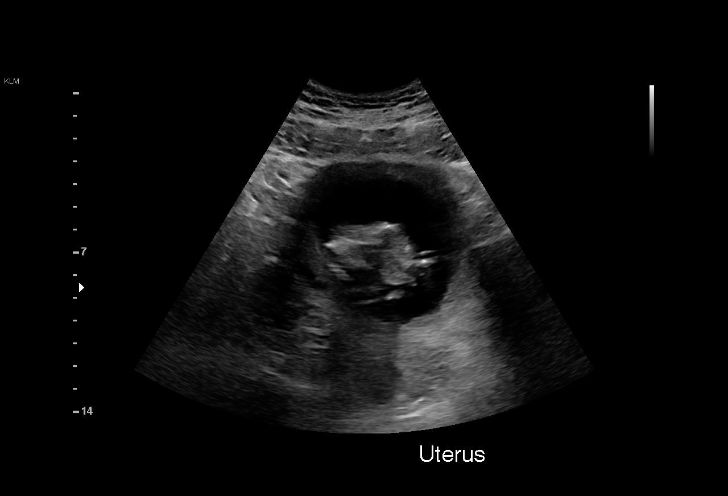
[im 7/27]
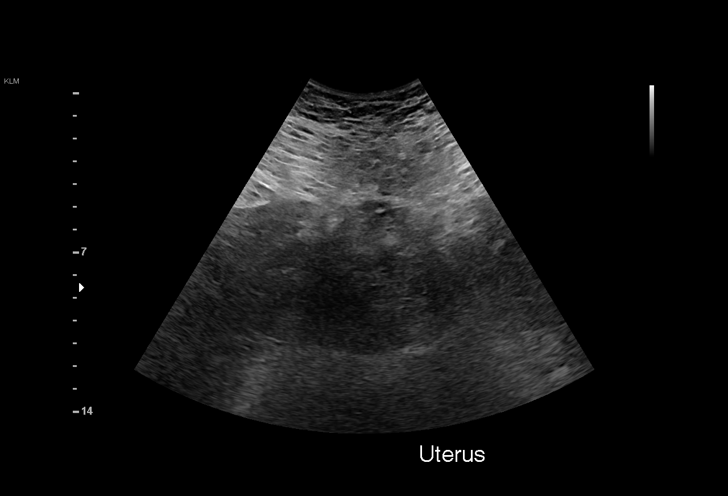
[im 9/27]
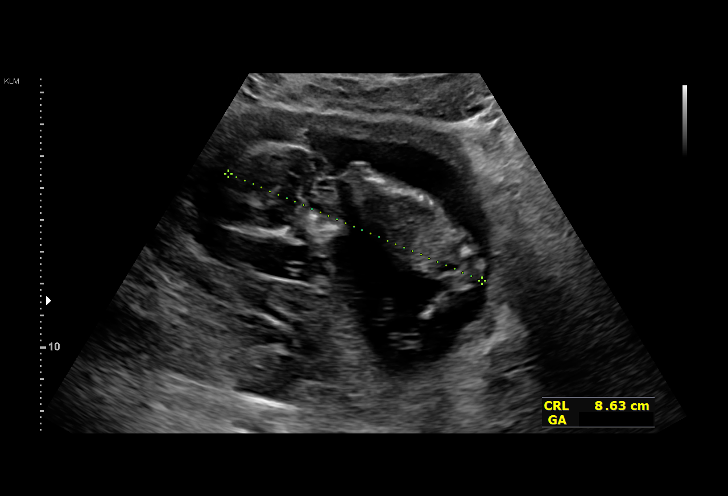
[im 10/27]
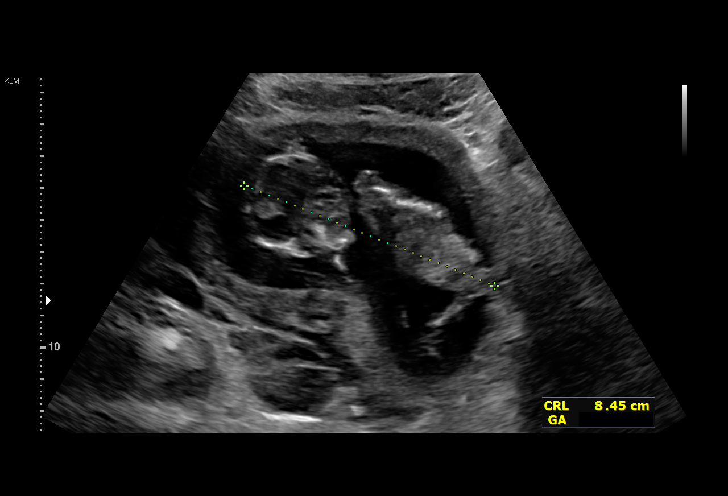
[im 12/27]
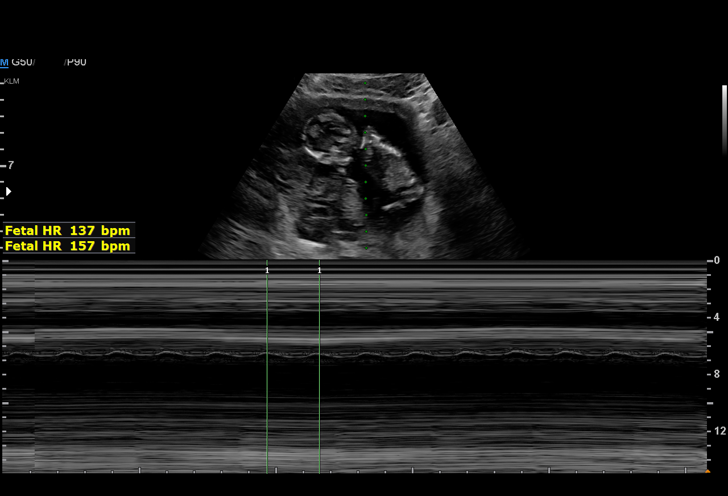
[im 14/27]
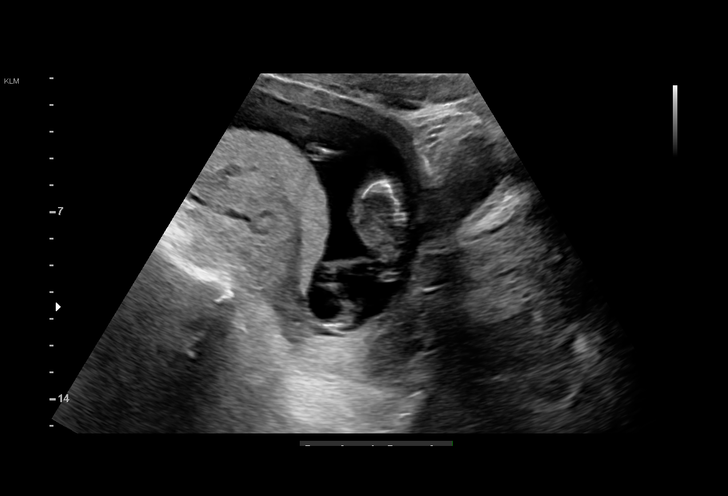
[im 16/27]
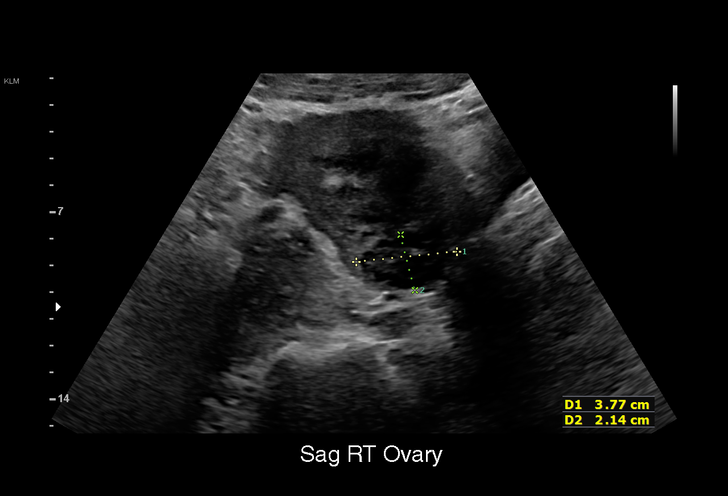
[im 18/27]
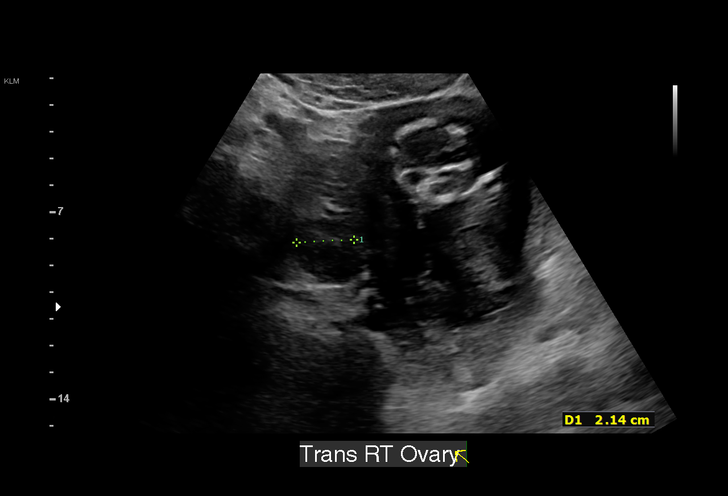
[im 19/27]
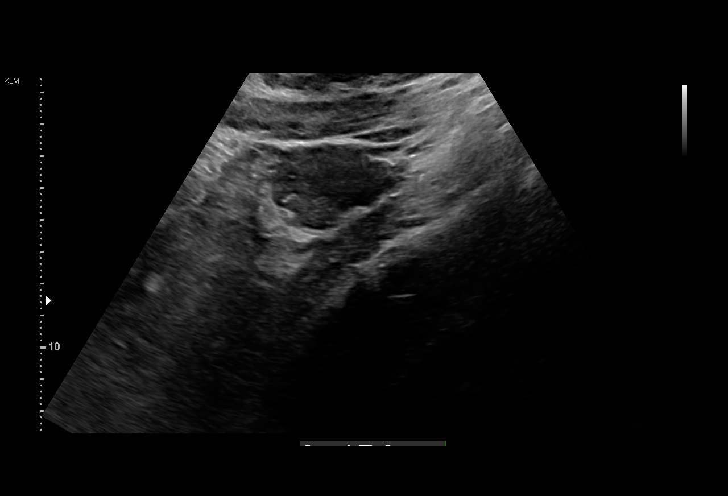
[im 21/27]
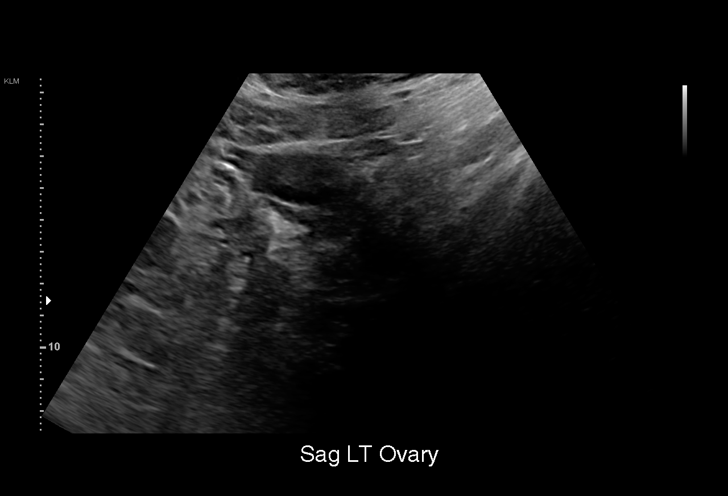
[im 23/27]
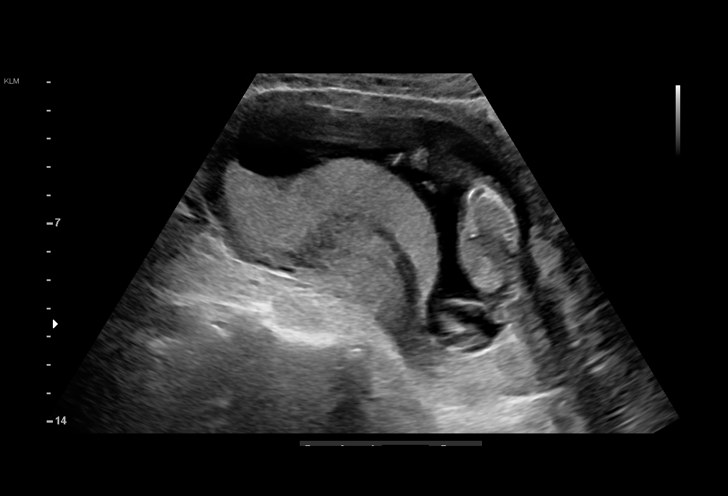
[im 25/27]
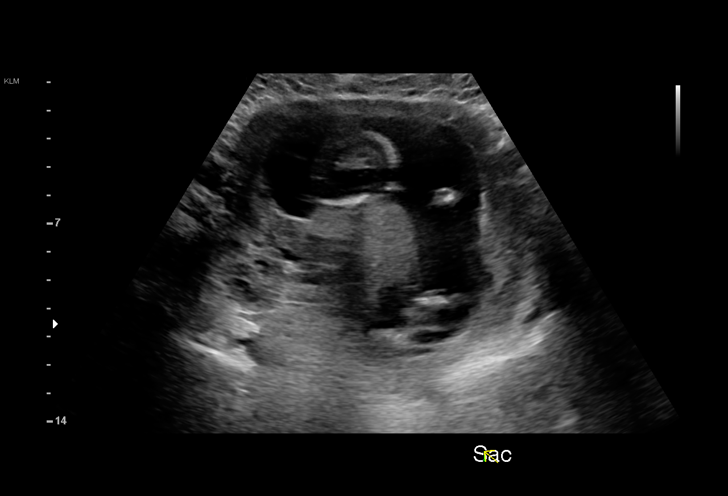
[im 27/27]
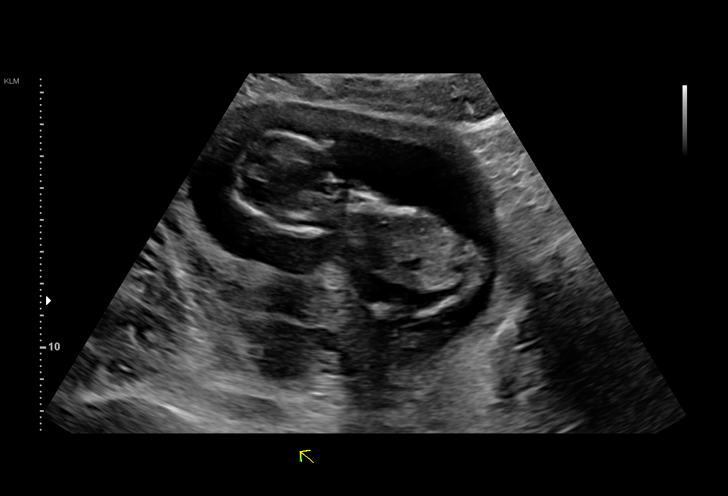

[15 of 27 positions shown; findings below may reference images not displayed]

FINDINGS: Intrauterine gestational sac: Single

Yolk sac:  Not Visualized.

Embryo:  Visualized.

Cardiac Activity: Visualized.

Heart Rate: 157 bpm

CRL:   8.44 cm mm   14 w 2 d                  US EDC: 12/10/2021

Subchorionic hemorrhage:  None visualized.

Maternal uterus/adnexae: Bilateral ovaries within normal limits. No
free fluid.
IMPRESSION: 1. Single live intrauterine gestation measuring 14 weeks 2 days by
crown-rump length. No acute abnormality.

## 2022-07-16 NOTE — L&D Delivery Note (Signed)
OB/GYN Faculty Practice Delivery Note  Briana Lynch is a 27 y.o. [id number] s/p SVD at [redacted]w[redacted]d. She was admitted following SVD of preterm infant at home. She called EMS and her placenta was delivered en route to [facility]. Fundus firm and no lacerations on exam. Further details per H&P.  Sundra Aland, MD OB Fellow, Faculty Practice [facility], [facility]

## 2022-09-17 IMAGING — US US MFM OB FOLLOW-UP
1 series · 13 of 28 positions shown · non-contrast
Comparison: none

[Series 1: us mfm ob follow-up · 87 acquisitions, 13 frames shown]
[im 4/87]
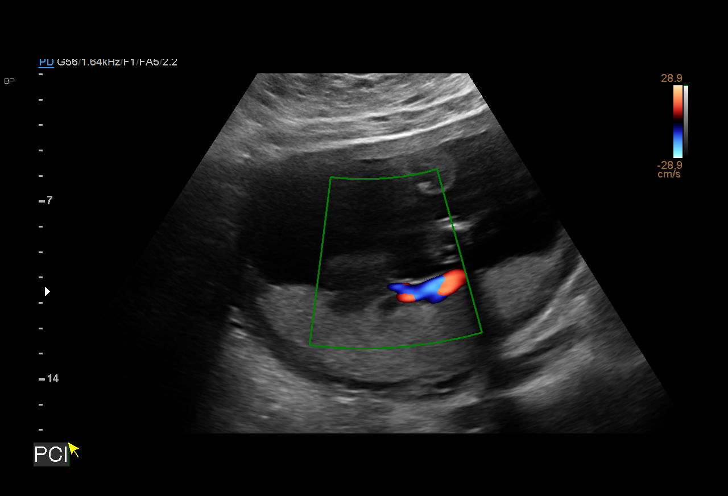
[im 10/87]
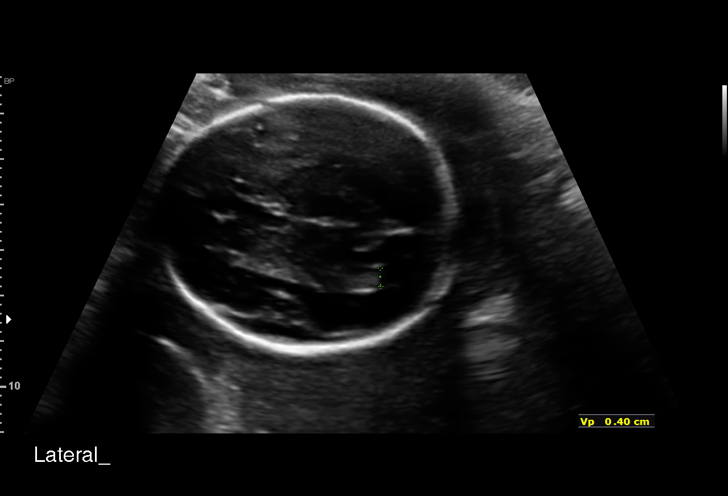
[im 16/87]
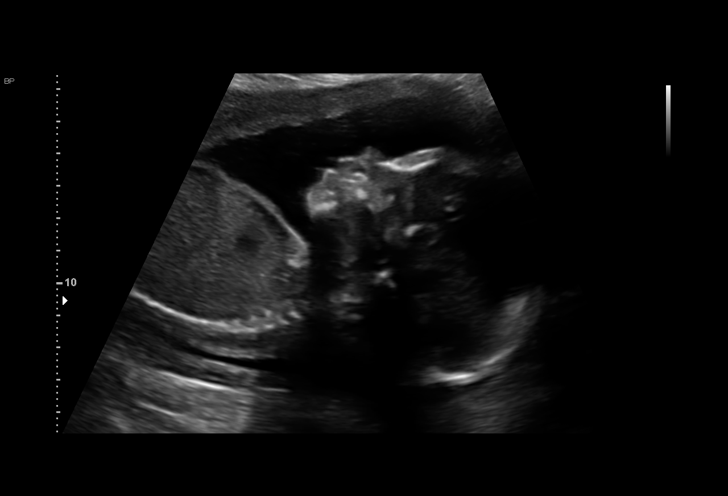
[im 23/87]
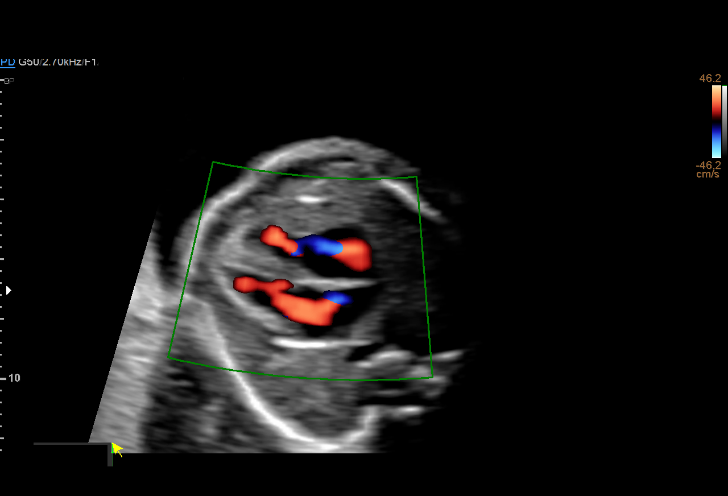
[im 29/87]
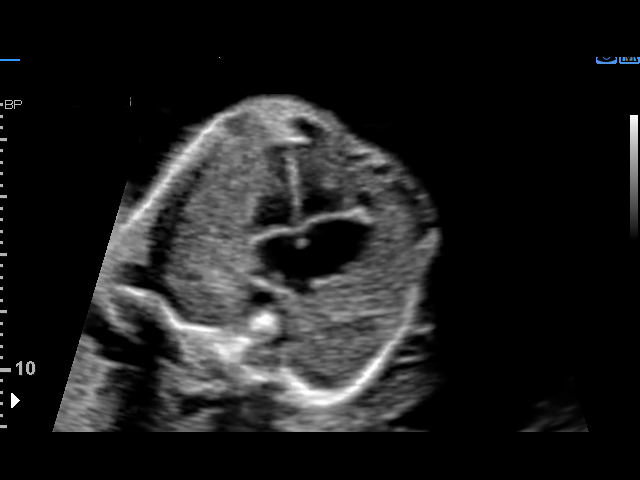
[im 36/87]
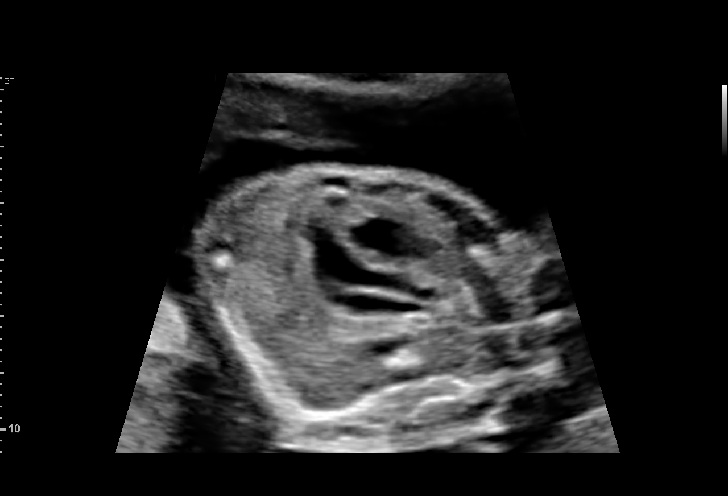
[im 45/87]
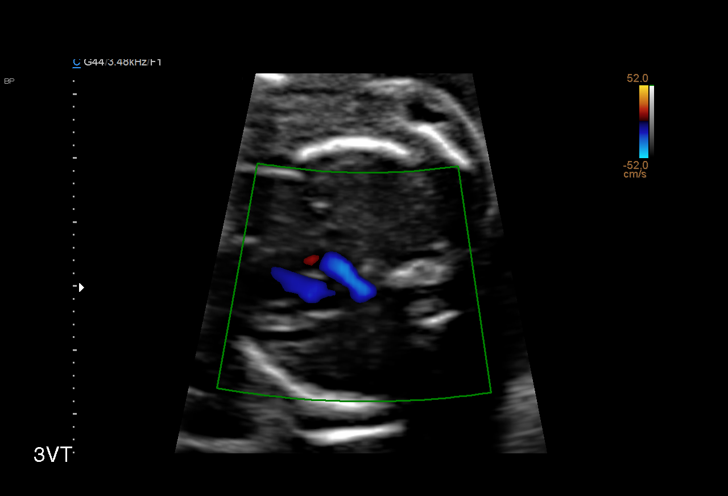
[im 51/87]
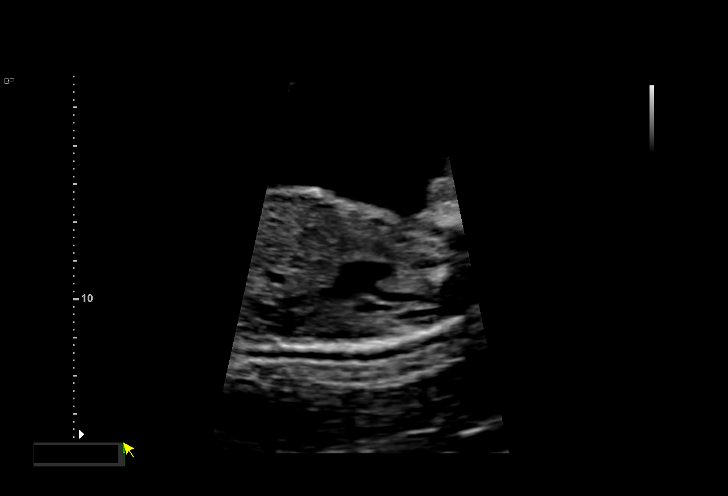
[im 58/87]
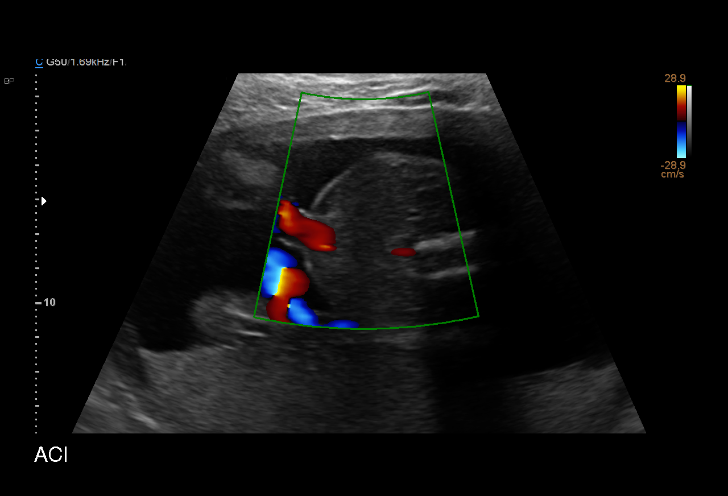
[im 64/87]
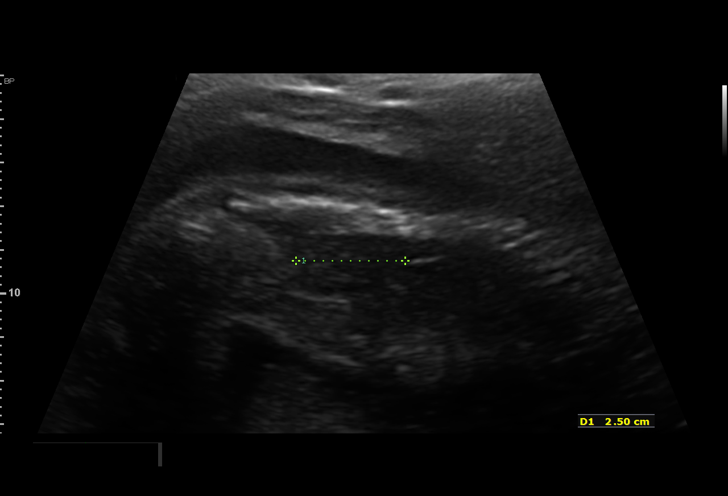
[im 71/87]
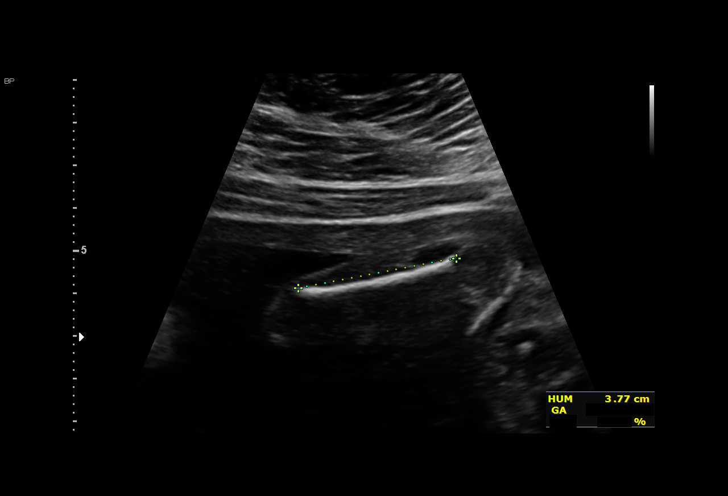
[im 77/87]
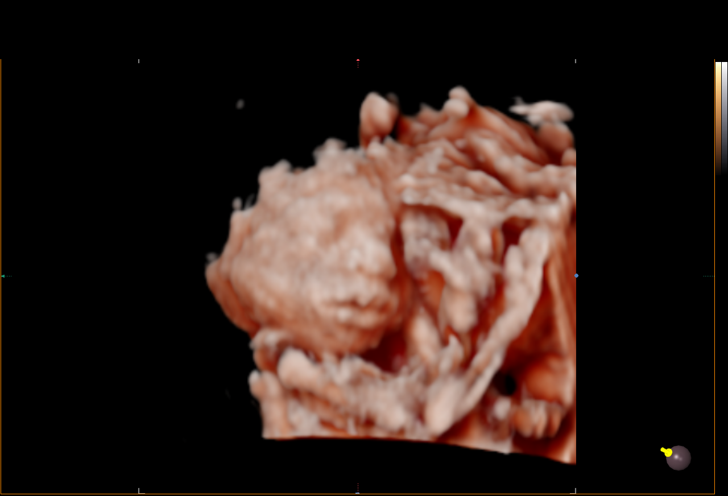
[im 83/87]
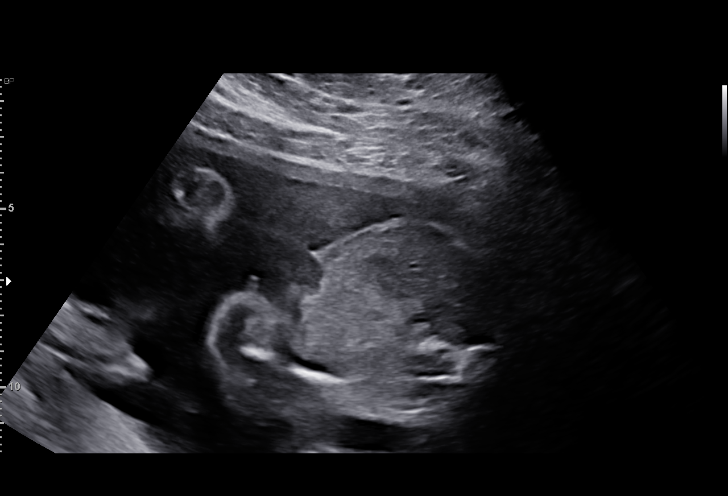

[13 of 28 positions shown; findings below may reference images not displayed]

Indications

 Obesity complicating pregnancy, second
 trimester (pregravid BMI 38)
 Tobacco use complicating pregnancy,
 second trimester
 Genetic carrier (silent Jonila Zaccagnini) partner
 declined testing
 Other mental disorder complicating
 pregnancy, second trimester
 Anemia during pregnancy in second trimester
 Low risk NIPS, neg AFP
 22 weeks gestation of pregnancy
Fetal Evaluation

 Num Of Fetuses:         1
 Fetal Heart Rate(bpm):  150
 Cardiac Activity:       Observed
 Presentation:           Cephalic
 Placenta:               Posterior
 P. Cord Insertion:      Previously Visualized

 Amniotic Fluid
 AFI FV:      Within normal limits

                             Largest Pocket(cm)

Biometry
 BPD:      55.3  mm     G. Age:  22w 6d         52  %    CI:        78.23   %    70 - 86
                                                         FL/HC:      19.8   %    19.2 -
 HC:      197.8  mm     G. Age:  22w 0d         12  %    HC/AC:      1.06        1.05 -
 AC:      187.2  mm     G. Age:  23w 3d         67  %    FL/BPD:     70.9   %    71 - 87
 FL:       39.2  mm     G. Age:  22w 4d         35  %    FL/AC:      20.9   %    20 - 24
 HUM:      37.7  mm     G. Age:  23w 2d         56  %
 LV:          4  mm

 Est. FW:     552  gm      1 lb 3 oz     57  %
OB History

 Gravidity:    1         Term:   0        Prem:   0        SAB:   0
 TOP:          0       Ectopic:  0        Living: 0
Gestational Age

 LMP:           22w 5d        Date:  03/09/21                 EDD:   12/14/21
 U/S Today:     22w 5d                                        EDD:   12/14/21
 Best:          22w 5d     Det. By:  LMP  (03/09/21)          EDD:   12/14/21
Anatomy

 Cranium:               Appears normal         Aortic Arch:            Previously seen
 Cavum:                 Appears normal         Ductal Arch:            Previously seen
 Ventricles:            Appears normal         Diaphragm:              Appears normal
 Choroid Plexus:        Previously seen        Stomach:                Appears normal, left
                                                                       sided
 Cerebellum:            Previously seen        Abdomen:                Appears normal
 Posterior Fossa:       Previously seen        Abdominal Wall:         Appears nml (cord
                                                                       insert, abd wall)
 Nuchal Fold:           Previously seen        Cord Vessels:           Appears normal (3
                                                                       vessel cord)
 Face:                  Appears normal         Kidneys:                Appear normal
                        (orbits and profile)
 Lips:                  Appears normal         Bladder:                Appears normal
 Thoracic:              Appears normal         Spine:                  Previously seen
 Heart:                 Appears normal         Upper Extremities:      Previously seen
                        (4CH, axis, and
                        situs)
 RVOT:                  Appears normal         Lower Extremities:      Previously seen
 LVOT:                  Appears normal

 Other:  Fetus appears to be a male. VC, 3VV and 3VTV visualized. Nasal
         bone, lenses, heels/feet and open hands/5th digits previously
         visualized.
Cervix Uterus Adnexa

 Cervix
 Length:           3.47  cm.
 Not visualized (advanced GA >40wks)

 Uterus
 Normal shape and size.
 Right Ovary
 Within normal limits.

 Left Ovary
 Within normal limits.

 Cul De Sac
 No free fluid seen.

 Adnexa
 No adnexal mass visualized.
Comments

 This patient was seen for a follow up exam as the views of
 the fetal anatomy were unable to be fully visualized during
 her last exam due to maternal obesity with a BMI of 38.  She
 denies any problems since her last exam.
 She was informed that the fetal growth and amniotic fluid
 level appears appropriate for her gestational age.
 The views of the fetal anatomy were visualized today.  There
 were no obvious anomalies noted.
 The limitations of ultrasound in the detection of all anomalies
 was discussed.
 Due to maternal obesity, a follow-up growth scan was
 scheduled in the third trimester.

## 2022-10-22 ENCOUNTER — Ambulatory Visit (INDEPENDENT_AMBULATORY_CARE_PROVIDER_SITE_OTHER): Payer: Medicaid Other

## 2022-10-22 DIAGNOSIS — Z3201 Encounter for pregnancy test, result positive: Secondary | ICD-10-CM | POA: Diagnosis not present

## 2022-10-22 DIAGNOSIS — Z3200 Encounter for pregnancy test, result unknown: Secondary | ICD-10-CM

## 2022-10-22 DIAGNOSIS — Z3491 Encounter for supervision of normal pregnancy, unspecified, first trimester: Secondary | ICD-10-CM

## 2022-10-22 LAB — POCT PREGNANCY, URINE: Preg Test, Ur: POSITIVE — AB

## 2022-10-22 NOTE — Progress Notes (Signed)
Possible Pregnancy  Here today for pregnancy confirmation. UPT in office today is positive. Pt reports first positive home UPT on 10/19/22. Reviewed dating with patient:   LMP: 09/09/22 approx EDD: 06/16/23 6w 1d today  Reports menstrual period every month around the 25th but does not record exact dates. Will return for dating [place] to confirm EDD on 11/06/22. OB history reviewed; preterm delivery at 23 weeks, may need cerclage per Crissie Reese MD. Recommended pt begin prenatal vitamin and schedule prenatal care. Would prefer to see [facility] or [employer]. Front office notified to schedule.  Marjo Bicker, RN 10/22/2022  12:12 PM

## 2022-11-04 ENCOUNTER — Inpatient Hospital Stay (HOSPITAL_COMMUNITY)
Admission: AD | Admit: 2022-11-04 | Discharge: 2022-11-04 | Disposition: A | Discharge status: Home / Self Care | Payer: Medicaid Other | Attending: Obstetrics & Gynecology | Admitting: Obstetrics & Gynecology

## 2022-11-04 ENCOUNTER — Inpatient Hospital Stay (HOSPITAL_COMMUNITY): Payer: Medicaid Other

## 2022-11-04 ENCOUNTER — Other Ambulatory Visit: Payer: Self-pay

## 2022-11-04 DIAGNOSIS — Z3A08 8 weeks gestation of pregnancy: Secondary | ICD-10-CM | POA: Diagnosis not present

## 2022-11-04 DIAGNOSIS — O26851 Spotting complicating pregnancy, first trimester: Secondary | ICD-10-CM | POA: Diagnosis not present

## 2022-11-04 DIAGNOSIS — Z3A01 Less than 8 weeks gestation of pregnancy: Secondary | ICD-10-CM

## 2022-11-04 DIAGNOSIS — O418X10 Other specified disorders of amniotic fluid and membranes, first trimester, not applicable or unspecified: Secondary | ICD-10-CM

## 2022-11-04 DIAGNOSIS — O208 Other hemorrhage in early pregnancy: Secondary | ICD-10-CM | POA: Insufficient documentation

## 2022-11-04 DIAGNOSIS — O468X1 Other antepartum hemorrhage, first trimester: Secondary | ICD-10-CM | POA: Diagnosis not present

## 2022-11-04 DIAGNOSIS — O200 Threatened abortion: Secondary | ICD-10-CM | POA: Diagnosis not present

## 2022-11-04 LAB — HIV ANTIBODY (ROUTINE TESTING W REFLEX): HIV Screen 4th Generation wRfx: NONREACTIVE

## 2022-11-04 LAB — HCG, QUANTITATIVE, PREGNANCY: hCG, Beta Chain, Quant, S: 47922 m[IU]/mL — ABNORMAL HIGH (ref <5)

## 2022-11-04 LAB — URINALYSIS, ROUTINE W REFLEX MICROSCOPIC
Bacteria, UA: NONE SEEN
Bilirubin Urine: NEGATIVE
Glucose, UA: NEGATIVE mg/dL
Ketones, ur: NEGATIVE mg/dL
Nitrite: NEGATIVE
Protein, ur: NEGATIVE mg/dL
Specific Gravity, Urine: 1.02 (ref 1.005–1.030)
pH: 6 (ref 5.0–8.0)

## 2022-11-04 LAB — WET PREP, GENITAL
Sperm: NONE SEEN
Trich, Wet Prep: NONE SEEN
WBC, Wet Prep HPF POC: <10 (ref <10)
Yeast Wet Prep HPF POC: NONE SEEN

## 2022-11-04 LAB — CBC
HCT: 34.4 % — ABNORMAL LOW (ref 36.0–46.0)
Hemoglobin: 11.6 g/dL — ABNORMAL LOW (ref 12.0–15.0)
MCH: 28.7 pg (ref 26.0–34.0)
MCHC: 33.7 g/dL (ref 30.0–36.0)
MCV: 85.1 fL (ref 80.0–100.0)
Platelets: 333 10*3/uL (ref 150–400)
RBC: 4.04 MIL/uL (ref 3.87–5.11)
RDW: 14.8 % (ref 11.5–15.5)
WBC: 11.3 10*3/uL — ABNORMAL HIGH (ref 4.0–10.5)
nRBC: 0 % (ref 0.0–0.2)

## 2022-11-04 NOTE — MAU Note (Signed)
Briana Lynch is a 27 y.o. at [redacted]w[redacted]d here in MAU reporting: she woke to having pink VB that was in the amount of a period. Reports has now decreased and is currently spotting.  Denies pain LMP: NA Onset of complaint: today Pain score: 0 Vitals:   11/04/22 1412  BP: 114/67  Pulse: 89  Temp: 97.9 F (36.6 C)  SpO2: 99%     FHT:NA Lab orders placed from triage:   UA

## 2022-11-04 NOTE — Discharge Instructions (Signed)
Return to MAU: °If you have heavier bleeding that soaks through more that 2 pads per hour for an hour or more °If you bleed so much that you feel like you might pass out or you do pass out °If you have significant abdominal pain that is not improved with Tylenol 1000 mg every 8 hours as needed for pain °If you develop a fever > 100.5 °

## 2022-11-04 NOTE — MAU Provider Note (Signed)
History     CSN: [id number]  Arrival date and time: 11/04/22 1359   Event Date/Time   First Provider Initiated Contact with Patient 11/04/22 1649      Chief Complaint  Patient presents with   Vaginal Bleeding   HPI [name] is a 27 y.o. year old G3P0101 female at [redacted] weeks gestation by LMP who presents to MAU reporting pink VB when she woke up. She describes the bleeding as pink and the amount of a period; now the amount is less. She denies pain. She receives [facility] with [facility]; next appt is 12/13/2022.   OB History     Gravida  2   Para  1   Term      Preterm  1   AB      Living  1      SAB      IAB      Ectopic      Multiple  0   Live Births  1           Past Medical History:  Diagnosis Date   Ankle sprain and strain 11/15/2010   Anxiety state 12/09/2012   Depression    PCOS (polycystic ovarian syndrome)     Past Surgical History:  Procedure Laterality Date   ESOPHAGOGASTRODUODENOSCOPY (EGD) WITH PROPOFOL N/A 10/24/2020   Procedure: ESOPHAGOGASTRODUODENOSCOPY (EGD) WITH PROPOFOL;  Surgeon: Beverley Fiedler, MD;  Location: [facility] ENDOSCOPY;  Service: Gastroenterology;  Laterality: N/A;    Family History  Problem Relation Age of Onset   Diabetes Mother    Hypertension Mother    Mental illness Sister     Social History   Tobacco Use   Smoking status: Every Day    Packs/day: .5    Types: Cigarettes   Smokeless tobacco: Never   Tobacco comments:    Dad smokes  Vaping Use   Vaping Use: Never used  Substance Use Topics   Alcohol use: Not Currently    Alcohol/week: 0.0 standard drinks of alcohol   Drug use: Yes    Types: Marijuana    Comment: infrequent    Allergies:  Allergies  Allergen Reactions   Penicillins Other (See Comments)    Did it involve swelling of the face/tongue/throat, SOB, or low BP? No Did it involve sudden or severe rash/hives, skin peeling, or any reaction on the inside of your mouth or nose? No Did you need to  seek medical attention at a hospital or doctor's office? No When did it last happen? Never      If all above answers are "NO", may proceed with cephalosporin use.  Unknown-pt's sister is allergic    No medications prior to admission.    Review of Systems  Constitutional: Negative.   HENT: Negative.    Eyes: Negative.   Respiratory: Negative.    Cardiovascular: Negative.   Gastrointestinal: Negative.   Endocrine: Negative.   Genitourinary:  Positive for vaginal bleeding.  Musculoskeletal: Negative.   Skin: Negative.   Allergic/Immunologic: Negative.   Neurological: Negative.   Hematological: Negative.   Psychiatric/Behavioral: Negative.     Physical Exam   Blood pressure 114/67, pulse 89, temperature 97.9 F (36.6 C), temperature source Oral, height 5' 8.5" (1.74 m), weight 121.9 kg, last menstrual period 09/09/2022, SpO2 99 %, not currently breastfeeding.  Physical Exam Vitals and nursing note reviewed.  Constitutional:      Appearance: Normal appearance.  Cardiovascular:     Rate and Rhythm: Normal rate.  Pulmonary:  Effort: Pulmonary effort is normal.  Genitourinary:    Comments: Swabs collected by patient Musculoskeletal:        General: Normal range of motion.  Skin:    General: Skin is warm and dry.  Neurological:     Mental Status: She is alert and oriented to person, place, and time.  Psychiatric:        Mood and Affect: Mood normal.        Behavior: Behavior normal.        Thought Content: Thought content normal.        Judgment: Judgment normal.    MAU Course  Procedures  MDM CCUA UPT CBC ABO/Rh HCG Wet Prep GC/CT -- pending HIV -- pending OB < 14 wks [place] with TV  Results for orders placed or performed during the hospital encounter of 11/04/22 (from the past 24 hour(s))  Urinalysis, Routine w reflex microscopic -Urine, Clean Catch     Status: Abnormal   Collection Time: 11/04/22  2:34 PM  Result Value Ref Range   Color, Urine YELLOW  YELLOW   APPearance HAZY (A) CLEAR   Specific Gravity, Urine 1.020 1.005 - 1.030   pH 6.0 5.0 - 8.0   Glucose, UA NEGATIVE NEGATIVE mg/dL   Hgb urine dipstick LARGE (A) NEGATIVE   Bilirubin Urine NEGATIVE NEGATIVE   Ketones, ur NEGATIVE NEGATIVE mg/dL   Protein, ur NEGATIVE NEGATIVE mg/dL   Nitrite NEGATIVE NEGATIVE   Leukocytes,Ua TRACE (A) NEGATIVE   RBC / HPF 0-5 0 - 5 RBC/hpf   WBC, UA 6-10 0 - 5 WBC/hpf   Bacteria, UA NONE SEEN NONE SEEN   Squamous Epithelial / HPF 6-10 0 - 5 /HPF   Mucus PRESENT   HIV Antibody (routine testing w rflx)     Status: None   Collection Time: 11/04/22  2:44 PM  Result Value Ref Range   HIV Screen 4th Generation wRfx Non Reactive Non Reactive  CBC     Status: Abnormal   Collection Time: 11/04/22  2:44 PM  Result Value Ref Range   WBC 11.3 (H) 4.0 - 10.5 K/uL   RBC 4.04 3.87 - 5.11 MIL/uL   Hemoglobin 11.6 (L) 12.0 - 15.0 g/dL   HCT 69.6 (L) 29.5 - 28.4 %   MCV 85.1 80.0 - 100.0 fL   MCH 28.7 26.0 - 34.0 pg   MCHC 33.7 30.0 - 36.0 g/dL   RDW 13.2 44.0 - 10.2 %   Platelets 333 150 - 400 K/uL   nRBC 0.0 0.0 - 0.2 %  hCG, quantitative, pregnancy     Status: Abnormal   Collection Time: 11/04/22  2:44 PM  Result Value Ref Range   hCG, Beta Chain, Quant, S 72,536 (H) <5 mIU/mL  Wet prep, genital     Status: Abnormal   Collection Time: 11/04/22  2:53 PM  Result Value Ref Range   Yeast Wet Prep HPF POC NONE SEEN NONE SEEN   Trich, Wet Prep NONE SEEN NONE SEEN   Clue Cells Wet Prep HPF POC PRESENT (A) NONE SEEN   WBC, Wet Prep HPF POC <10 <10   Sperm NONE SEEN     US OB LESS THAN 14 WEEKS WITH OB TRANSVAGINAL  Result Date: 11/04/2022 CLINICAL DATA:  Pregnant.  Spotting EXAM: OBSTETRIC <14 WK [place] AND TRANSVAGINAL OB TECHNIQUE: Both transabdominal and transvaginal ultrasound examinations were performed for complete evaluation of the gestation as well as the maternal uterus, adnexal regions, and pelvic cul-de-sac. Transvaginal technique was  performed to assess early pregnancy. COMPARISON:  None Available. FINDINGS: Intrauterine gestational sac: Single Yolk sac:  Visualized. Embryo:  Visualized. Cardiac Activity: Visualized. Heart Rate: 122 bpm CRL:   7.4 mm   6 w for d                  [place] EDC: 06/26/2023 Subchorionic hemorrhage:  Small Maternal uterus/adnexae: Right ovary measures 3.8 x 2.6 x 2.7 cm. Left ovary is best seen transabdominally and measures 4.3 x 2.0 by 3.9 cm. No separate adnexal mass. No significant free fluid in the maternal pelvis IMPRESSION: Single live intrauterine pregnancy of 6 weeks and 4 days with positive fetal heart motion. Small subchorionic hemorrhage Electronically Signed   By: Karen Kays M.D.   On: 11/04/2022 16:10     Assessment and Plan  1. Subchorionic hematoma in first trimester, single or unspecified fetus - Information provided on [facility]   2. Spotting affecting pregnancy in first trimester - Information provided on VB in pregnancy - Return to MAU: If you have heavier bleeding that soaks through more that 2 pads per hour for an hour or more If you bleed so much that you feel like you might pass out or you do pass out If you have significant abdominal pain that is not improved with Tylenol 1000 mg every 8 hours as needed for pain If you develop a fever > 100.5    3. Threatened miscarriage in early pregnancy - Information provided on threatened miscarriage   4. [redacted] weeks gestation of pregnancy   - Discharge patient - Keep scheduled appt with [facility] - U/S appt for 11/06/2022 cancelled - Patient verbalized an understanding of the plan of care and agrees.    Raelyn Mora, CNM, MSN 11/04/2022, 5:17 PM

## 2022-11-05 LAB — GC/CHLAMYDIA PROBE AMP (~~LOC~~) NOT AT ARMC
Chlamydia: NEGATIVE
Comment: NEGATIVE
Comment: NORMAL
Neisseria Gonorrhea: NEGATIVE

## 2022-11-06 ENCOUNTER — Other Ambulatory Visit: Payer: Medicaid Other

## 2022-11-06 ENCOUNTER — Ambulatory Visit: Payer: Medicaid Other | Admitting: Advanced Practice Midwife

## 2022-11-21 ENCOUNTER — Encounter: Payer: Self-pay | Admitting: Family Medicine

## 2022-12-13 ENCOUNTER — Telehealth: Payer: Medicaid Other

## 2022-12-18 ENCOUNTER — Encounter: Payer: Self-pay | Admitting: Family Medicine

## 2022-12-18 ENCOUNTER — Ambulatory Visit (INDEPENDENT_AMBULATORY_CARE_PROVIDER_SITE_OTHER): Payer: Medicaid Other | Admitting: Family Medicine

## 2022-12-18 ENCOUNTER — Other Ambulatory Visit: Payer: Self-pay

## 2022-12-18 VITALS — BP 116/71 | HR 89 | Wt 272.6 lb

## 2022-12-18 DIAGNOSIS — B009 Herpesviral infection, unspecified: Secondary | ICD-10-CM

## 2022-12-18 DIAGNOSIS — Z1332 Encounter for screening for maternal depression: Secondary | ICD-10-CM | POA: Diagnosis not present

## 2022-12-18 DIAGNOSIS — Z3A12 12 weeks gestation of pregnancy: Secondary | ICD-10-CM

## 2022-12-18 DIAGNOSIS — N883 Incompetence of cervix uteri: Secondary | ICD-10-CM

## 2022-12-18 DIAGNOSIS — O99011 Anemia complicating pregnancy, first trimester: Secondary | ICD-10-CM

## 2022-12-18 DIAGNOSIS — O0991 Supervision of high risk pregnancy, unspecified, first trimester: Secondary | ICD-10-CM | POA: Insufficient documentation

## 2022-12-18 DIAGNOSIS — R7303 Prediabetes: Secondary | ICD-10-CM

## 2022-12-18 DIAGNOSIS — O99012 Anemia complicating pregnancy, second trimester: Secondary | ICD-10-CM

## 2022-12-18 NOTE — Addendum Note (Signed)
Addended by: Isabell Jarvis on: 12/18/2022 09:49 AM   Modules accepted: Orders

## 2022-12-18 NOTE — Patient Instructions (Signed)
Second Trimester of Pregnancy  The second trimester of pregnancy is from week 13 through week 27. This is months 4 through 6 of pregnancy. The second trimester is often a time when you feel your best. Your body has adjusted to being pregnant, and you begin to feel better physically. During the second trimester: Morning sickness has lessened or stopped completely. You may have more energy. You may have an increase in appetite. The second trimester is also a time when the unborn baby (fetus) is growing rapidly. At the end of the sixth month, the fetus may be up to 12 inches long and weigh about 1 pounds. You will likely begin to feel the baby move (quickening) between 16 and 20 weeks of pregnancy. Body changes during your second trimester Your body continues to go through many changes during your second trimester. The changes vary and generally return to normal after the baby is born. Physical changes Your weight will continue to increase. You will notice your lower abdomen bulging out. You may begin to get stretch marks on your hips, abdomen, and breasts. Your breasts will continue to grow and to become tender. Dark spots or blotches (chloasma or mask of pregnancy) may develop on your face. A dark line from your belly button to the pubic area (linea nigra) may appear. You may have changes in your hair. These can include thickening of your hair, rapid growth, and changes in texture. Some people also have hair loss during or after pregnancy, or hair that feels dry or thin. Health changes You may develop headaches. You may have heartburn. You may develop constipation. You may develop hemorrhoids or swollen, bulging veins (varicose veins). Your gums may bleed and may be sensitive to brushing and flossing. You may urinate more often because the fetus is pressing on your bladder. You may have back pain. This is caused by: Weight gain. Pregnancy hormones that are relaxing the joints in your  pelvis. A shift in weight and the muscles that support your balance. Follow these instructions at home: Medicines Follow your health care provider's instructions regarding medicine use. Specific medicines may be either safe or unsafe to take during pregnancy. Do not take any medicines unless approved by your health care provider. Take a prenatal vitamin that contains at least 600 micrograms (mcg) of folic acid. Eating and drinking Eat a healthy diet that includes fresh fruits and vegetables, whole grains, good sources of protein such as meat, eggs, or tofu, and low-fat dairy products. Avoid raw meat and unpasteurized juice, milk, and cheese. These carry germs that can harm you and your baby. You may need to take these actions to prevent or treat constipation: Drink enough fluid to keep your urine pale yellow. Eat foods that are high in fiber, such as beans, whole grains, and fresh fruits and vegetables. Limit foods that are high in fat and processed sugars, such as fried or sweet foods. Activity Exercise only as directed by your health care provider. Most people can continue their usual exercise routine during pregnancy. Try to exercise for 30 minutes at least 5 days a week. Stop exercising if you develop contractions in your uterus. Stop exercising if you develop pain or cramping in the lower abdomen or lower back. Avoid exercising if it is very hot or humid or if you are at a high altitude. Avoid heavy lifting. If you choose to, you may have sex unless your health care provider tells you not to. Relieving pain and discomfort Wear a supportive   bra to prevent discomfort from breast tenderness. Take warm sitz baths to soothe any pain or discomfort caused by hemorrhoids. Use hemorrhoid cream if your health care provider approves. Rest with your legs raised (elevated) if you have leg cramps or low back pain. If you develop varicose veins: Wear support hose as told by your health care  provider. Elevate your feet for 15 minutes, 3-4 times a day. Limit salt in your diet. Safety Wear your seat belt at all times when driving or riding in a car. Talk with your health care provider if someone is verbally or physically abusive to you. Lifestyle Do not use hot tubs, steam rooms, or saunas. Do not douche. Do not use tampons or scented sanitary pads. Avoid cat litter boxes and soil used by cats. These carry germs that can cause birth defects in the baby and possibly loss of the fetus by miscarriage or stillbirth. Do not use herbal remedies, alcohol, illegal drugs, or medicines that are not approved by your health care provider. Chemicals in these products can harm your baby. Do not use any products that contain nicotine or tobacco, such as cigarettes, e-cigarettes, and chewing tobacco. If you need help quitting, ask your health care provider. General instructions During a routine prenatal visit, your health care provider will do a physical exam and other tests. He or she will also discuss your overall health. Keep all follow-up visits. This is important. Ask your health care provider for a referral to a local prenatal education class. Ask for help if you have counseling or nutritional needs during pregnancy. Your health care provider can offer advice or refer you to specialists for help with various needs. Where to find more information American Pregnancy Association: americanpregnancy.org American College of Obstetricians and Gynecologists: acog.org/en/Womens%20Health/Pregnancy Office on Women's Health: womenshealth.gov/pregnancy Contact a health care provider if you have: A headache that does not go away when you take medicine. Vision changes or you see spots in front of your eyes. Mild pelvic cramps, pelvic pressure, or nagging pain in the abdominal area. Persistent nausea, vomiting, or diarrhea. A bad-smelling vaginal discharge or foul-smelling urine. Pain when you  urinate. Sudden or extreme swelling of your face, hands, ankles, feet, or legs. A fever. Get help right away if you: Have fluid leaking from your vagina. Have spotting or bleeding from your vagina. Have severe abdominal cramping or pain. Have difficulty breathing. Have chest pain. Have fainting spells. Have not felt your baby move for the time period told by your health care provider. Have new or increased pain, swelling, or redness in an arm or leg. Summary The second trimester of pregnancy is from week 13 through week 27 (months 4 through 6). Do not use herbal remedies, alcohol, illegal drugs, or medicines that are not approved by your health care provider. Chemicals in these products can harm your baby. Exercise only as directed by your health care provider. Most people can continue their usual exercise routine during pregnancy. Keep all follow-up visits. This is important. This information is not intended to replace advice given to you by your health care provider. Make sure you discuss any questions you have with your health care provider. Document Revised: 12/09/2019 Document Reviewed: 10/15/2019 Elsevier Patient Education  2024 Elsevier Inc.  Contraception Choices Contraception, also called birth control, refers to methods or devices that prevent pregnancy. Hormonal methods  Contraceptive implant A contraceptive implant is a thin, plastic tube that contains a hormone that prevents pregnancy. It is different from an intrauterine device (  IUD). It is inserted into the upper part of the arm by a health care provider. Implants can be effective for up to 3 years. Progestin-only injections Progestin-only injections are injections of progestin, a synthetic form of the hormone progesterone. They are given every 3 months by a health care provider. Birth control pills Birth control pills are pills that contain hormones that prevent pregnancy. They must be taken once a day, preferably at  the same time each day. A prescription is needed to use this method of contraception. Birth control patch The birth control patch contains hormones that prevent pregnancy. It is placed on the skin and must be changed once a week for three weeks and removed on the fourth week. A prescription is needed to use this method of contraception. Vaginal ring A vaginal ring contains hormones that prevent pregnancy. It is placed in the vagina for three weeks and removed on the fourth week. After that, the process is repeated with a new ring. A prescription is needed to use this method of contraception. Emergency contraceptive Emergency contraceptives prevent pregnancy after unprotected sex. They come in pill form and can be taken up to 5 days after sex. They work best the sooner they are taken after having sex. Most emergency contraceptives are available without a prescription. This method should not be used as your only form of birth control. Barrier methods  Female condom A female condom is a thin sheath that is worn over the penis during sex. Condoms keep sperm from going inside a woman's body. They can be used with a sperm-killing substance (spermicide) to increase their effectiveness. They should be thrown away after one use. Female condom A female condom is a soft, loose-fitting sheath that is put into the vagina before sex. The condom keeps sperm from going inside a woman's body. They should be thrown away after one use. Diaphragm A diaphragm is a soft, dome-shaped barrier. It is inserted into the vagina before sex, along with a spermicide. The diaphragm blocks sperm from entering the uterus, and the spermicide kills sperm. A diaphragm should be left in the vagina for 6-8 hours after sex and removed within 24 hours. A diaphragm is prescribed and fitted by a health care provider. A diaphragm should be replaced every 1-2 years, after giving birth, after gaining more than 15 lb (6.8 kg), and after pelvic  surgery. Cervical cap A cervical cap is a round, soft latex or plastic cup that fits over the cervix. It is inserted into the vagina before sex, along with spermicide. It blocks sperm from entering the uterus. The cap should be left in place for 6-8 hours after sex and removed within 48 hours. A cervical cap must be prescribed and fitted by a health care provider. It should be replaced every 2 years. Sponge A sponge is a soft, circular piece of polyurethane foam with spermicide in it. The sponge helps block sperm from entering the uterus, and the spermicide kills sperm. To use it, you make it wet and then insert it into the vagina. It should be inserted before sex, left in for at least 6 hours after sex, and removed and thrown away within 30 hours. Spermicides Spermicides are chemicals that kill or block sperm from entering the cervix and uterus. They can come as a cream, jelly, suppository, foam, or tablet. A spermicide should be inserted into the vagina with an applicator at least 10-15 minutes before sex to allow time for it to work. The process must be   repeated every time you have sex. Spermicides do not require a prescription. Intrauterine contraception Intrauterine device (IUD) An IUD is a T-shaped device that is put in a woman's uterus. There are two types: Hormone IUD.This type contains progestin, a synthetic form of the hormone progesterone. This type can stay in place for 3-5 years. Copper IUD.This type is wrapped in copper wire. It can stay in place for 10 years. Permanent methods of contraception Female tubal ligation In this method, a woman's fallopian tubes are sealed, tied, or blocked during surgery to prevent eggs from traveling to the uterus. Hysteroscopic sterilization In this method, a small, flexible insert is placed into each fallopian tube. The inserts cause scar tissue to form in the fallopian tubes and block them, so sperm cannot reach an egg. The procedure takes about 3  months to be effective. Another form of birth control must be used during those 3 months. Female sterilization This is a procedure to tie off the tubes that carry sperm (vasectomy). After the procedure, the man can still ejaculate fluid (semen). Another form of birth control must be used for 3 months after the procedure. Natural planning methods Natural family planning In this method, a couple does not have sex on days when the woman could become pregnant. Calendar method In this method, the woman keeps track of the length of each menstrual cycle, identifies the days when pregnancy can happen, and does not have sex on those days. Ovulation method In this method, a couple avoids sex during ovulation. Symptothermal method This method involves not having sex during ovulation. The woman typically checks for ovulation by watching changes in her temperature and in the consistency of cervical mucus. Post-ovulation method In this method, a couple waits to have sex until after ovulation. Where to find more information Centers for Disease Control and Prevention: www.cdc.gov Summary Contraception, also called birth control, refers to methods or devices that prevent pregnancy. Hormonal methods of contraception include implants, injections, pills, patches, vaginal rings, and emergency contraceptives. Barrier methods of contraception can include female condoms, female condoms, diaphragms, cervical caps, sponges, and spermicides. There are two types of IUDs (intrauterine devices). An IUD can be put in a woman's uterus to prevent pregnancy for 3-5 years. Permanent sterilization can be done through a procedure for males and females. Natural family planning methods involve nothaving sex on days when the woman could become pregnant. This information is not intended to replace advice given to you by your health care provider. Make sure you discuss any questions you have with your health care provider. Document  Revised: 12/07/2019 Document Reviewed: 12/07/2019 Elsevier Patient Education  2024 Elsevier Inc.  

## 2022-12-18 NOTE — Progress Notes (Signed)
Subjective:   Briana Lynch is a 27 y.o. G2P0101 at [redacted]w[redacted]d by 6 wk [place] being seen today for her first obstetrical visit.  Her obstetrical history is significant for  cervical incompetence, prediabetes, preterm delivery at 23 weeks, hx of HSV . Patient does intend to breast feed. Pregnancy history fully reviewed.  Patient reports  mild intermittent cramping .  HISTORY: OB History  Gravida Para Term Preterm AB Living  2 1 0 1 0 1  SAB IAB Ectopic Multiple Live Births  0 0 0 0 1    # Outcome Date GA Lbr Len/2nd Weight Sex Delivery Anes PTL Lv  2 Current           1 Preterm 08/18/21 [redacted]w[redacted]d 03:05 / 00:50 1 lb 4.1 oz (0.57 kg) M Vag-Spont None  LIV     Name: JANISHA, ALMAGER     Apgar1: 6  Apgar5: 8     Last pap smear: Lab Results  Component Value Date   DIAGPAP  05/25/2021    - Negative for intraepithelial lesion or malignancy (NILM)   HPVHIGH Negative 05/25/2021    Past Medical History:  Diagnosis Date   Ankle sprain and strain 11/15/2010   Anxiety state 12/09/2012   Depression    PCOS (polycystic ovarian syndrome)    Past Surgical History:  Procedure Laterality Date   ESOPHAGOGASTRODUODENOSCOPY (EGD) WITH PROPOFOL N/A 10/24/2020   Procedure: ESOPHAGOGASTRODUODENOSCOPY (EGD) WITH PROPOFOL;  Surgeon: Beverley Fiedler, MD;  Location: [facility] ENDOSCOPY;  Service: Gastroenterology;  Laterality: N/A;   Family History  Problem Relation Age of Onset   Diabetes Mother    Hypertension Mother    Mental illness Sister    Social History   Tobacco Use   Smoking status: Every Day    Packs/day: .5    Types: Cigarettes   Smokeless tobacco: Never   Tobacco comments:    Dad smokes  Vaping Use   Vaping Use: Never used  Substance Use Topics   Alcohol use: Not Currently    Alcohol/week: 0.0 standard drinks of alcohol   Drug use: Yes    Types: Marijuana    Comment: infrequent   Allergies  Allergen Reactions   Penicillins Other (See Comments)    Did it involve swelling of the  face/tongue/throat, SOB, or low BP? No Did it involve sudden or severe rash/hives, skin peeling, or any reaction on the inside of your mouth or nose? No Did you need to seek medical attention at a hospital or doctor's office? No When did it last happen? Never      If all above answers are "NO", may proceed with cephalosporin use.  Unknown-pt's sister is allergic   Current Outpatient Medications on File Prior to Visit  Medication Sig Dispense Refill   Prenatal MV & Min w/FA-DHA (PRENATAL GUMMIES) 0.18-25 MG CHEW Chew by mouth.     Nicotine 21-14-7 MG/24HR KIT Place 1 patch onto the skin as directed. (Patient not taking: Reported on 10/22/2022) 1 kit 0   nicotine polacrilex (NICORETTE) 2 MG gum Take 1 each (2 mg total) by mouth as needed for smoking cessation. (Patient not taking: Reported on 10/22/2022) 100 tablet 2   No current facility-administered medications on file prior to visit.     Exam   Vitals:   12/18/22 0917  BP: 116/71  Pulse: 89  Weight: 272 lb 9.6 oz (123.7 kg)   Fetal Heart Rate (bpm): 152  System: General: well-developed, well-nourished female in no  acute distress   Skin: normal coloration and turgor, no rashes   Neurologic: oriented, normal, negative, normal mood   Extremities: normal strength, tone, and muscle mass, ROM of all joints is normal   HEENT PERRLA, extraocular movement intact and sclera clear, anicteric   Neck supple and no masses   Respiratory:  no respiratory distress      Assessment:   Pregnancy: G2P0101 Patient Active Problem List   Diagnosis Date Noted   Supervision of high risk pregnancy in first trimester 12/18/2022   Preterm delivery 08/18/2021   Cervical incompetence 08/17/2021   Caustic injury gastritis    Suicide attempt (HCC) 10/23/2020   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 Supervision of high risk pregnancy in first trimester BP and FHR normal  2. Cervical incompetence Discussed cerclage is indicated, will send message to schedule Will also try to get early [place] for cervical length  3. HSV-2 (herpes simplex virus 2) infection No lesions for 7 or 8 years  4. Prediabetes On new OB labs from last pregnancy, check A1c, low threshold to do early 2hr GTT  5. Preterm delivery At 23 weeks in setting of cervical incompetence, baby is doing amazing with no deficits   Initial labs drawn. Continue prenatal vitamins. Genetic Screening discussed, NIPS: ordered. Ultrasound discussed; fetal anatomic survey: ordered. Problem list reviewed and updated. The nature of [facility] was explained to patient; Voiced they may need to be seen by other [facility] providers which includes family medicine physicians, OB GYNs, and APPs. Delivery will hopefully be with one of the [facility] providers or another [facility] Medicine physician and we cannot promise this at this time.  Discussed there are [facility] staff in the hospital 24-7 and they understand and support this model and there is a likelihood one of these providers will catch their baby.  We also discussed that the service includes learners (residents, student) and they will be involved in the care team.  Routine obstetric precautions reviewed. Return in 4 weeks (on 01/15/2023) for [facility] patient, ob visit.

## 2022-12-18 NOTE — Addendum Note (Signed)
Addended by: Coolidge Breeze on: 12/18/2022 11:04 AM   Modules accepted: Orders

## 2022-12-19 ENCOUNTER — Telehealth: Payer: Self-pay

## 2022-12-19 DIAGNOSIS — O99810 Abnormal glucose complicating pregnancy: Secondary | ICD-10-CM

## 2022-12-19 LAB — CBC/D/PLT+RPR+RH+ABO+RUBIGG...
Antibody Screen: NEGATIVE
Basophils Absolute: 0 10*3/uL (ref 0.0–0.2)
Basos: 0 %
EOS (ABSOLUTE): 0.1 10*3/uL (ref 0.0–0.4)
Eos: 1 %
HCV Ab: NONREACTIVE
HIV Screen 4th Generation wRfx: NONREACTIVE
Hematocrit: 30.8 % — ABNORMAL LOW (ref 34.0–46.6)
Hemoglobin: 9.8 g/dL — ABNORMAL LOW (ref 11.1–15.9)
Hepatitis B Surface Ag: NEGATIVE
Immature Grans (Abs): 0 10*3/uL (ref 0.0–0.1)
Immature Granulocytes: 0 %
Lymphocytes Absolute: 3.1 10*3/uL (ref 0.7–3.1)
Lymphs: 30 %
MCH: 28.4 pg (ref 26.6–33.0)
MCHC: 31.8 g/dL (ref 31.5–35.7)
MCV: 89 fL (ref 79–97)
Monocytes Absolute: 0.4 10*3/uL (ref 0.1–0.9)
Monocytes: 4 %
Neutrophils Absolute: 6.6 10*3/uL (ref 1.4–7.0)
Neutrophils: 65 %
Platelets: 334 10*3/uL (ref 150–450)
RBC: 3.45 x10E6/uL — ABNORMAL LOW (ref 3.77–5.28)
RDW: 14.9 % (ref 11.7–15.4)
RPR Ser Ql: NONREACTIVE
Rh Factor: POSITIVE
Rubella Antibodies, IGG: 1.26 index (ref >0.99)
WBC: 10.3 10*3/uL (ref 3.4–10.8)

## 2022-12-19 LAB — HEMOGLOBIN A1C
Est. average glucose Bld gHb Est-mCnc: 128 mg/dL
Hgb A1c MFr Bld: 6.1 % — ABNORMAL HIGH (ref 4.8–5.6)

## 2022-12-19 LAB — HCV INTERPRETATION

## 2022-12-19 MED ORDER — FERROUS SULFATE 325 (65 FE) MG PO TBEC
325.0000 mg | DELAYED_RELEASE_TABLET | ORAL | 2 refills | Status: AC
Start: 2022-12-19 — End: 2023-06-17

## 2022-12-19 NOTE — Addendum Note (Signed)
Addended by: Merian Capron on: 12/19/2022 10:37 AM   Modules accepted: Orders

## 2022-12-19 NOTE — Telephone Encounter (Signed)
Call placed to pt. Spoke with pt. Pt given results per Dr Crissie Reese. Pt verbalized understanding of plan of care. Pt scheduled for early 2 hr GTT 6/6 at 850am, fasting protocol reviewed. Pt also to pick up Rx Fe. 2hr GTT future orders placed.  Judeth Cornfield, RNC

## 2022-12-19 NOTE — Telephone Encounter (Signed)
Contacted patient to provide surgery information. Left patient a voicemail asking her to call me to confirm her availability for 12/24/22 at [phone]. I advised that she would need to arrive by 10 am.

## 2022-12-19 NOTE — Telephone Encounter (Signed)
Patient returned my call and advised she was available on 12/24/22 for surgery and she would arrive at [facility] by 10 am. I provided patient with date, time, location and Pre-Op instructions were provided by telephone.

## 2022-12-19 NOTE — Telephone Encounter (Signed)
-----   Message from Venora Maples, MD sent at 12/19/2022 10:31 AM EDT ----- A1c in prediabetic range, will need to do a 2 hr GTT, please schedule Mild anemia, start PO iron Remainder of new OB labs unremarkable

## 2022-12-20 ENCOUNTER — Other Ambulatory Visit: Payer: Self-pay

## 2022-12-20 ENCOUNTER — Other Ambulatory Visit: Payer: Medicaid Other

## 2022-12-20 DIAGNOSIS — O0991 Supervision of high risk pregnancy, unspecified, first trimester: Secondary | ICD-10-CM

## 2022-12-20 DIAGNOSIS — O99810 Abnormal glucose complicating pregnancy: Secondary | ICD-10-CM

## 2022-12-20 DIAGNOSIS — N883 Incompetence of cervix uteri: Secondary | ICD-10-CM

## 2022-12-20 DIAGNOSIS — O99210 Obesity complicating pregnancy, unspecified trimester: Secondary | ICD-10-CM | POA: Insufficient documentation

## 2022-12-20 LAB — URINE CULTURE, OB REFLEX: Organism ID, Bacteria: NO GROWTH

## 2022-12-20 LAB — CULTURE, OB URINE

## 2022-12-21 ENCOUNTER — Other Ambulatory Visit: Payer: Self-pay | Admitting: Obstetrics and Gynecology

## 2022-12-21 DIAGNOSIS — Z01812 Encounter for preprocedural laboratory examination: Secondary | ICD-10-CM

## 2022-12-21 LAB — GLUCOSE TOLERANCE, 2 HOURS W/ 1HR
Glucose, 1 hour: 136 mg/dL (ref 70–179)
Glucose, 2 hour: 108 mg/dL (ref 70–152)
Glucose, Fasting: 84 mg/dL (ref 70–91)

## 2022-12-24 ENCOUNTER — Ambulatory Visit: Payer: Medicaid Other | Attending: Family Medicine

## 2022-12-24 ENCOUNTER — Ambulatory Visit (HOSPITAL_COMMUNITY): Payer: Medicaid Other | Admitting: Anesthesiology

## 2022-12-24 ENCOUNTER — Encounter (HOSPITAL_COMMUNITY): Payer: Self-pay | Admitting: Obstetrics and Gynecology

## 2022-12-24 ENCOUNTER — Ambulatory Visit: Payer: Medicaid Other

## 2022-12-24 ENCOUNTER — Ambulatory Visit (HOSPITAL_COMMUNITY)
Admission: RE | Admit: 2022-12-24 | Discharge: 2022-12-24 | Disposition: A | Discharge status: Home / Self Care | Payer: Medicaid Other | Attending: Obstetrics and Gynecology | Admitting: Obstetrics and Gynecology

## 2022-12-24 ENCOUNTER — Encounter (HOSPITAL_COMMUNITY)
Admission: RE | Disposition: A | Discharge status: Home / Self Care | Payer: Self-pay | Source: Home / Self Care | Attending: Obstetrics and Gynecology

## 2022-12-24 ENCOUNTER — Other Ambulatory Visit: Payer: Self-pay

## 2022-12-24 DIAGNOSIS — O3430 Maternal care for cervical incompetence, unspecified trimester: Secondary | ICD-10-CM

## 2022-12-24 DIAGNOSIS — O99321 Drug use complicating pregnancy, first trimester: Secondary | ICD-10-CM | POA: Insufficient documentation

## 2022-12-24 DIAGNOSIS — Z3A13 13 weeks gestation of pregnancy: Secondary | ICD-10-CM | POA: Diagnosis not present

## 2022-12-24 DIAGNOSIS — O3431 Maternal care for cervical incompetence, first trimester: Secondary | ICD-10-CM | POA: Diagnosis present

## 2022-12-24 DIAGNOSIS — O99211 Obesity complicating pregnancy, first trimester: Secondary | ICD-10-CM

## 2022-12-24 DIAGNOSIS — F17210 Nicotine dependence, cigarettes, uncomplicated: Secondary | ICD-10-CM | POA: Diagnosis not present

## 2022-12-24 DIAGNOSIS — O99210 Obesity complicating pregnancy, unspecified trimester: Secondary | ICD-10-CM

## 2022-12-24 DIAGNOSIS — Z3A08 8 weeks gestation of pregnancy: Secondary | ICD-10-CM

## 2022-12-24 DIAGNOSIS — O99019 Anemia complicating pregnancy, unspecified trimester: Secondary | ICD-10-CM | POA: Diagnosis not present

## 2022-12-24 DIAGNOSIS — D649 Anemia, unspecified: Secondary | ICD-10-CM | POA: Diagnosis not present

## 2022-12-24 DIAGNOSIS — O99331 Smoking (tobacco) complicating pregnancy, first trimester: Secondary | ICD-10-CM | POA: Diagnosis not present

## 2022-12-24 DIAGNOSIS — Z01812 Encounter for preprocedural laboratory examination: Secondary | ICD-10-CM

## 2022-12-24 DIAGNOSIS — F1290 Cannabis use, unspecified, uncomplicated: Secondary | ICD-10-CM | POA: Diagnosis not present

## 2022-12-24 HISTORY — PX: CERVICAL CERCLAGE: SHX1329

## 2022-12-24 LAB — CBC
HCT: 31.5 % — ABNORMAL LOW (ref 36.0–46.0)
Hemoglobin: 10.5 g/dL — ABNORMAL LOW (ref 12.0–15.0)
MCH: 29.7 pg (ref 26.0–34.0)
MCHC: 33.3 g/dL (ref 30.0–36.0)
MCV: 89.2 fL (ref 80.0–100.0)
Platelets: 336 10*3/uL (ref 150–400)
RBC: 3.53 MIL/uL — ABNORMAL LOW (ref 3.87–5.11)
RDW: 15.2 % (ref 11.5–15.5)
WBC: 10.1 10*3/uL (ref 4.0–10.5)
nRBC: 0 % (ref 0.0–0.2)

## 2022-12-24 LAB — TYPE AND SCREEN
ABO/RH(D): O POS
Antibody Screen: NEGATIVE

## 2022-12-24 LAB — RPR: RPR Ser Ql: NONREACTIVE

## 2022-12-24 SURGERY — CERCLAGE, CERVIX, VAGINAL APPROACH
Anesthesia: Spinal

## 2022-12-24 MED ORDER — LACTATED RINGERS IV SOLN
INTRAVENOUS | Status: DC
  Administered 2022-12-24: 125 mL/h via INTRAVENOUS

## 2022-12-24 MED ORDER — PHENYLEPHRINE HCL-NACL 20-0.9 MG/250ML-% IV SOLN
INTRAVENOUS | Status: DC | PRN
  Administered 2022-12-24: 15 ug/min via INTRAVENOUS

## 2022-12-24 MED ORDER — POVIDONE-IODINE 10 % EX SWAB: 2.0000 | Freq: Once | CUTANEOUS | Status: DC

## 2022-12-24 MED ORDER — BUPIVACAINE IN DEXTROSE 0.75-8.25 % IT SOLN
INTRATHECAL | Status: DC | PRN
  Administered 2022-12-24: 1.4 mL via INTRATHECAL

## 2022-12-24 SURGICAL SUPPLY — 14 items
CANISTER SUCT 3000ML PPV (MISCELLANEOUS) ×2 IMPLANT
GLOVE BIOGEL PI IND STRL 6.5 (GLOVE) ×1 IMPLANT
GLOVE BIOGEL PI IND STRL 7.0 (GLOVE) ×1 IMPLANT
GLOVE SURG SS PI 6.5 STRL IVOR (GLOVE) ×1 IMPLANT
GOWN STRL REUS W/TWL LRG LVL3 (GOWN DISPOSABLE) ×2 IMPLANT
NS IRRIG 1000ML POUR BTL (IV SOLUTION) ×1 IMPLANT
PACK VAGINAL MINOR WOMEN LF (CUSTOM PROCEDURE TRAY) ×1 IMPLANT
PAD OB MATERNITY 4.3X12.25 (PERSONAL CARE ITEMS) ×1 IMPLANT
PAD PREP 24X48 CUFFED NSTRL (MISCELLANEOUS) ×1 IMPLANT
SUT PROLENE 0 CT 1 30 (SUTURE) ×1 IMPLANT
TOWEL OR 17X24 6PK STRL BLUE (TOWEL DISPOSABLE) ×2 IMPLANT
TRAY FOLEY W/BAG SLVR 14FR (SET/KITS/TRAYS/PACK) ×1 IMPLANT
TUBING NON-CON 1/4 X 20 CONN (TUBING) ×1 IMPLANT
YANKAUER SUCT BULB TIP NO VENT (SUCTIONS) ×1 IMPLANT

## 2022-12-24 NOTE — H&P (Signed)
Briana Lynch is a 27 y.o. female G2P0101 at [redacted]w[redacted]d presenting for scheduled cervical cerclage placement. Patient with history of incompetent cervix resulting in 23 week delivery. Rest of prenatal care otherwise uncomplicated. Patient reports feeling well and denies any cramping or vaginal bleeding.   OB History     Gravida  2   Para  1   Term      Preterm  1   AB      Living  1      SAB      IAB      Ectopic      Multiple  0   Live Births  1          Past Medical History:  Diagnosis Date   Ankle sprain and strain 11/15/2010   Anxiety state 12/09/2012   Caustic injury gastritis    Depression    Iron deficiency anemia 10/19/2014   PCOS (polycystic ovarian syndrome)    Urinary incontinence 12/11/2012   Vitamin D deficiency 08/20/2014   Past Surgical History:  Procedure Laterality Date   ESOPHAGOGASTRODUODENOSCOPY (EGD) WITH PROPOFOL N/A 10/24/2020   Procedure: ESOPHAGOGASTRODUODENOSCOPY (EGD) WITH PROPOFOL;  Surgeon: Beverley Fiedler, MD;  Location: [facility] ENDOSCOPY;  Service: Gastroenterology;  Laterality: N/A;   Family History: family history includes Diabetes in her mother; Hypertension in her mother; Mental illness in her sister. Social History:  reports that she has been smoking cigarettes. She has been smoking an average of .5 packs per day. She has never used smokeless tobacco. She reports that she does not currently use alcohol. She reports current drug use. Drug: Marijuana.     Maternal Diabetes: No Genetic Screening: pending Maternal Ultrasounds/Referrals: pending Fetal Ultrasounds or other Referrals:  None Maternal Substance Abuse:  No Significant Maternal Medications:  None Significant Maternal Lab Results:  None Number of Prenatal Visits:Less than or equal to 3 verified prenatal visits Other Comments:  None  Review of Systems See pertinent in HPI. All other systems reviewed and non contributory History   Blood pressure 106/65, pulse 89,  temperature 98.3 F (36.8 C), temperature source Oral, resp. rate 16, height 5\' 8"  (1.727 m), weight 122.5 kg, last menstrual period 09/09/2022, SpO2 98 %, not currently breastfeeding. Exam Physical Exam  Blood pressure 106/65, pulse 89, temperature 98.3 F (36.8 C), temperature source Oral, resp. rate 16, height 5\' 8"  (1.727 m), weight 122.5 kg, last menstrual period 09/09/2022, SpO2 98 %, not currently breastfeeding.  GENERAL: Well-developed, well-nourished female in no acute distress.  LUNGS: Clear to auscultation bilaterally.  HEART: Regular rate and rhythm. ABDOMEN: Soft, nontender, nondistended. No organomegaly. PELVIC: Deferred to OR EXTREMITIES: No cyanosis, clubbing, or edema, 2+ distal pulses.  Prenatal labs: ABO, Rh: --/--/PENDING (06/10 1610) Antibody: PENDING (06/10 0948) Rubella: 1.26 (06/04 9604) RPR: Non Reactive (06/04 0942)  HBsAg: Negative (06/04 0942)  HIV: Non Reactive (06/04 0942)  GBS:     Assessment/Plan: 27 yo G2P0101 at [redacted]w[redacted]d with history of incompetent cervix here for cerclage placement - Risks, benefits and alternatives were explained including but not limited to risks of bleeding, PPROM, infection and damage to adjacent organs.  - Patient verbalized understanding and all questions were answered   Briana Lynch 12/24/2022, 10:34 AM

## 2022-12-24 NOTE — Anesthesia Procedure Notes (Signed)
Spinal  Patient location during procedure: OR Start time: 12/24/2022 12:43 PM End time: 12/24/2022 12:48 PM Reason for block: surgical anesthesia Staffing Performed: anesthesiologist  Anesthesiologist: Marcene Duos, MD Performed by: Marcene Duos, MD Authorized by: Marcene Duos, MD   Preanesthetic Checklist Completed: patient identified, IV checked, site marked, risks and benefits discussed, surgical consent, monitors and equipment checked, pre-op evaluation and timeout performed Spinal Block Patient position: sitting Prep: DuraPrep Patient monitoring: heart rate, cardiac monitor, continuous pulse ox and blood pressure Approach: midline Location: L3-4 Injection technique: single-shot Needle Needle type: Pencan  Needle gauge: 24 G Needle length: 9 cm Assessment Sensory level: T4 Events: CSF return

## 2022-12-24 NOTE — Anesthesia Preprocedure Evaluation (Signed)
Anesthesia Evaluation  Patient identified by MRN, date of birth, ID band Patient awake    Reviewed: Allergy & Precautions, NPO status , Patient's Chart, lab work & pertinent test results  Airway Mallampati: III  TM Distance: >3 FB     Dental  (+) Dental Advisory Given   Pulmonary Current Smoker and Patient abstained from smoking.   breath sounds clear to auscultation       Cardiovascular negative cardio ROS  Rhythm:Regular Rate:Normal     Neuro/Psych negative neurological ROS     GI/Hepatic negative GI ROS, Neg liver ROS,,,  Endo/Other    Morbid obesity  Renal/GU negative Renal ROS     Musculoskeletal   Abdominal   Peds  Hematology  (+) Blood dyscrasia, anemia   Anesthesia Other Findings   Reproductive/Obstetrics (+) Pregnancy                             Anesthesia Physical Anesthesia Plan  ASA: 3  Anesthesia Plan: Spinal   Post-op Pain Management:    Induction:   PONV Risk Score and Plan: 1 and Ondansetron and Treatment may vary due to age or medical condition  Airway Management Planned: Natural Airway  Additional Equipment:   Intra-op Plan:   Post-operative Plan:   Informed Consent: I have reviewed the patients History and Physical, chart, labs and discussed the procedure including the risks, benefits and alternatives for the proposed anesthesia with the patient or authorized representative who has indicated his/her understanding and acceptance.       Plan Discussed with:   Anesthesia Plan Comments:        Anesthesia Quick Evaluation

## 2022-12-24 NOTE — Transfer of Care (Signed)
Immediate Anesthesia Transfer of Care Note  Patient: Briana Lynch  Procedure(s) Performed: CERCLAGE CERVICAL  Patient Location: PACU  Anesthesia Type:Spinal  Level of Consciousness: awake, alert , and oriented  Airway & Oxygen Therapy: Patient Spontanous Breathing  Post-op Assessment: Report given to RN  Post vital signs: Reviewed  Last Vitals:  Vitals Value Taken Time  BP 105/57 12/24/22 1400  Temp 36.9 C 12/24/22 1344  Pulse 65 12/24/22 1400  Resp 9 12/24/22 1400  SpO2 97 % 12/24/22 1400  Vitals shown include unvalidated device data.  Last Pain:  Vitals:   12/24/22 1344  TempSrc: Oral  PainSc: 0-No pain         Complications: No notable events documented.

## 2022-12-24 NOTE — Op Note (Signed)
Briana Lynch   PROCEDURE DATE: 12/24/2022  PREOPERATIVE DIAGNOSIS: Intrauterine pregnancy at [redacted]w[redacted]d, history of cervical incompetence   POSTOPERATIVE DIAGNOSIS: The same PROCEDURE: Transvaginal McDonald Cervical Cerclage Placement SURGEON:  Dr. Catalina Antigua  INDICATIONS: 27 y.o. G2P0101 at [redacted]w[redacted]d with history of cervical incompetence, here for cerclage placement.   The risks of surgery were discussed in detail with the patient including but not limited to: bleeding; infection which may require antibiotic therapy; injury to cervix, vagina other surrounding organs; risk of ruptured membranes and/or preterm delivery and other postoperative or anesthesia complications.  Written informed consent was obtained.    FINDINGS:  About 2 cm palpable cervical length in the vagina, closed cervix, suture knot placed anteriorly.  ANESTHESIA:  Spinal INTRAVENOUS FLUIDS: 500  ml ESTIMATED BLOOD LOSS: 20 ml COMPLICATIONS: None immediate  PROCEDURE IN DETAIL:  The patient had sequential compression devices applied to her lower extremities while in the preoperative area.  Reassuring fetal heart rate was also obtained using a doppler. She was then taken to the operating room where spinal anesthesia was administered and was found to be adequate.  She was placed in the dorsal lithotomy, and was prepped and draped in a sterile manner. Her bladder was catheterized for an unmeasured amount of clear, yellow urine.  After an adequate timeout was performed, a vaginal speculum was then placed in the patient's vagina and the anterior and posterior lips of the cervix was grasped with ring forceps. A curved needle loaded with a number 1 Prolene suture was inserted at 12 o'clock, as high as possible at the junction of the rugated vaginal epithelium and the smooth cervix, at least 2 cm above the external os.  Four bites are taken circumferentially around the entire cervix in a purse-string fashion, each bite should be deep enough to  extend at least midway into the cervical stroma, but not into the endocervical canal. The two ends of the suture were then tied securely anteriorly and cut, leaving the ends long enough to grasp with a clamp when it is time to remove it. There was minimal bleeding noted and the ring forceps were removed with good hemostasis noted.  All instruments were removed from the patient's vagina.  Instrument, needle and sponge counts were correct x 2. The patient tolerated the procedure well, and was taken to the recovery area awake and in stable condition. Bedside ultrasound performed in the OR revealed an active viable fetus  The patient will be discharged to home as per PACU criteria.  Routine postoperative instructions given.   She will follow up in the clinic on 01/10/23 for postoperative evaluation and ongoing prenatal care

## 2022-12-25 LAB — PANORAMA PRENATAL TEST FULL PANEL:PANORAMA TEST PLUS 5 ADDITIONAL MICRODELETIONS: FETAL FRACTION: 12.2

## 2022-12-25 NOTE — Anesthesia Postprocedure Evaluation (Signed)
Anesthesia Post Note  Patient: Briana Lynch  Procedure(s) Performed: CERCLAGE CERVICAL     Patient location during evaluation: PACU Anesthesia Type: Spinal Level of consciousness: awake and alert Pain management: pain level controlled Vital Signs Assessment: post-procedure vital signs reviewed and stable Respiratory status: spontaneous breathing and respiratory function stable Cardiovascular status: blood pressure returned to baseline and stable Postop Assessment: spinal receding Anesthetic complications: no   No notable events documented.  Last Vitals:  Vitals:   12/24/22 1526 12/24/22 1527  BP:    Pulse: 86   Resp: 20 (!) 24  Temp:    SpO2: 98%     Last Pain:  Vitals:   12/24/22 1500  TempSrc:   PainSc: 0-No pain                 Kennieth Rad

## 2023-01-09 ENCOUNTER — Encounter: Payer: Medicaid Other | Admitting: Family Medicine

## 2023-01-10 ENCOUNTER — Encounter: Payer: Medicaid Other | Admitting: Family Medicine

## 2023-01-14 ENCOUNTER — Other Ambulatory Visit: Payer: Self-pay

## 2023-01-14 ENCOUNTER — Other Ambulatory Visit (HOSPITAL_COMMUNITY)
Admission: RE | Admit: 2023-01-14 | Discharge: 2023-01-14 | Disposition: A | Discharge status: Home / Self Care | Payer: MEDICAID | Source: Ambulatory Visit | Attending: Family Medicine | Admitting: Family Medicine

## 2023-01-14 ENCOUNTER — Encounter: Payer: Self-pay | Admitting: Obstetrics and Gynecology

## 2023-01-14 ENCOUNTER — Ambulatory Visit (INDEPENDENT_AMBULATORY_CARE_PROVIDER_SITE_OTHER): Payer: MEDICAID | Admitting: Obstetrics and Gynecology

## 2023-01-14 VITALS — BP 120/78 | HR 116 | Wt 265.8 lb

## 2023-01-14 DIAGNOSIS — O3432 Maternal care for cervical incompetence, second trimester: Secondary | ICD-10-CM | POA: Insufficient documentation

## 2023-01-14 DIAGNOSIS — Z3A16 16 weeks gestation of pregnancy: Secondary | ICD-10-CM

## 2023-01-14 DIAGNOSIS — N883 Incompetence of cervix uteri: Secondary | ICD-10-CM

## 2023-01-14 DIAGNOSIS — O09899 Supervision of other high risk pregnancies, unspecified trimester: Secondary | ICD-10-CM

## 2023-01-14 DIAGNOSIS — O0991 Supervision of high risk pregnancy, unspecified, first trimester: Secondary | ICD-10-CM | POA: Insufficient documentation

## 2023-01-14 DIAGNOSIS — O09892 Supervision of other high risk pregnancies, second trimester: Secondary | ICD-10-CM

## 2023-01-14 DIAGNOSIS — B009 Herpesviral infection, unspecified: Secondary | ICD-10-CM

## 2023-01-14 DIAGNOSIS — O0992 Supervision of high risk pregnancy, unspecified, second trimester: Secondary | ICD-10-CM

## 2023-01-14 HISTORY — DX: Maternal care for cervical incompetence, second trimester: O34.32

## 2023-01-14 MED ORDER — PROMETHAZINE HCL 25 MG PO TABS
25.0000 mg | ORAL_TABLET | Freq: Four times a day (QID) | ORAL | 2 refills | Status: DC | PRN
Start: 2023-01-14 — End: 2023-05-22

## 2023-01-14 NOTE — Progress Notes (Signed)
   Subjective:  Briana Lynch is a 27 y.o. G2P0101 at [redacted]w[redacted]d being seen today for ongoing prenatal care.  She is currently monitored for the following issues for this high-risk pregnancy and has PCOS (polycystic ovarian syndrome); Prediabetes; HSV-2 (herpes simplex virus 2) infection; Suicide attempt (HCC); Anemia in pregnancy, second trimester; Cervical insufficiency in pregnancy, antepartum, first trimester; Preterm delivery; Supervision of high risk pregnancy in first trimester; and Obesity affecting pregnancy on their problem list.  Patient reports no complaints.  Contractions: Not present. Vag. Bleeding: None.  Movement: Present. Denies leaking of fluid.   The following portions of the patient's history were reviewed and updated as appropriate: allergies, current medications, past family history, past medical history, past social history, past surgical history and problem list. Problem list updated.  Objective:   Vitals:   01/14/23 1408  BP: 120/78  Pulse: (!) 116  Weight: 265 lb 12.8 oz (120.6 kg)    Fetal Status: Fetal Heart Rate (bpm): 147   Movement: Present     General:  Alert, oriented and cooperative. Patient is in no acute distress.  Skin: Skin is warm and dry. No rash noted.   Cardiovascular: Normal heart rate noted  Respiratory: Normal respiratory effort, no problems with respiration noted  Abdomen: Soft, gravid, appropriate for gestational age. Pain/Pressure: Absent     Pelvic: Vag. Bleeding: None     Cervical exam deferred        Extremities: Normal range of motion.  Edema: None  Mental Status: Normal mood and affect. Normal behavior. Normal judgment and thought content.   Urinalysis:      Assessment and Plan:  Pregnancy: G2P0101 at [redacted]w[redacted]d 1. Supervision of high risk pregnancy in first trimester BP and FHR normal  2. Cervical incompetence 3. Cervical cerclage suture present in second trimester Cerclage done 6/10 and doing well  Awaiting ultrasound   4. HSV-2  (herpes simplex virus 2) infection Supression at 36 weeks   5. History of preterm delivery, currently pregnant Last delivery at 23 weeks  6. [redacted] weeks gestation of pregnancy Up to date Discussed AFP today - AFP, Serum, Open Spina Bifida   There are no diagnoses linked to this encounter. Preterm labor symptoms and general obstetric precautions including but not limited to vaginal bleeding, contractions, leaking of fluid and fetal movement were reviewed in detail with the patient. Please refer to After Visit Summary for other counseling recommendations.  Future Appointments  Date Time Provider Department Center  01/30/2023  8:15 AM [facility] NURSE [facility]  01/30/2023  8:30 AM [facility] US3 [facility]  02/07/2023  3:55 PM Crissie Reese, Mary Sella, MD [facility]    Sue Lush, FNP

## 2023-01-15 LAB — GC/CHLAMYDIA PROBE AMP (~~LOC~~) NOT AT ARMC
Chlamydia: NEGATIVE
Comment: NEGATIVE
Comment: NORMAL
Neisseria Gonorrhea: NEGATIVE

## 2023-01-16 LAB — AFP, SERUM, OPEN SPINA BIFIDA: Gest. Age on Collection Date: 3.3 weeks

## 2023-01-18 LAB — AFP, SERUM, OPEN SPINA BIFIDA
AFP MoM: 0.84
AFP Value: 24.5 ng/mL
Maternal Age At EDD: 26.5 a
Test Results:: NEGATIVE
Weight: 265 [lb_av]

## 2023-01-23 DIAGNOSIS — D563 Thalassemia minor: Secondary | ICD-10-CM | POA: Insufficient documentation

## 2023-01-27 ENCOUNTER — Encounter (HOSPITAL_COMMUNITY): Payer: Self-pay | Admitting: Obstetrics & Gynecology

## 2023-01-27 ENCOUNTER — Inpatient Hospital Stay (HOSPITAL_COMMUNITY)
Admission: AD | Admit: 2023-01-27 | Discharge: 2023-01-27 | Disposition: A | Discharge status: Home / Self Care | Payer: MEDICAID | Attending: Obstetrics & Gynecology | Admitting: Obstetrics & Gynecology

## 2023-01-27 ENCOUNTER — Other Ambulatory Visit: Payer: Self-pay

## 2023-01-27 DIAGNOSIS — R109 Unspecified abdominal pain: Secondary | ICD-10-CM | POA: Diagnosis not present

## 2023-01-27 DIAGNOSIS — Z3A18 18 weeks gestation of pregnancy: Secondary | ICD-10-CM | POA: Insufficient documentation

## 2023-01-27 DIAGNOSIS — O26899 Other specified pregnancy related conditions, unspecified trimester: Secondary | ICD-10-CM

## 2023-01-27 DIAGNOSIS — O26892 Other specified pregnancy related conditions, second trimester: Secondary | ICD-10-CM | POA: Insufficient documentation

## 2023-01-27 DIAGNOSIS — O3432 Maternal care for cervical incompetence, second trimester: Secondary | ICD-10-CM | POA: Diagnosis not present

## 2023-01-27 LAB — URINALYSIS, ROUTINE W REFLEX MICROSCOPIC
Bilirubin Urine: NEGATIVE
Glucose, UA: NEGATIVE mg/dL
Hgb urine dipstick: NEGATIVE
Ketones, ur: NEGATIVE mg/dL
Nitrite: NEGATIVE
Protein, ur: 30 mg/dL — AB
Specific Gravity, Urine: 1.026 (ref 1.005–1.030)
pH: 6 (ref 5.0–8.0)

## 2023-01-27 NOTE — Discharge Instructions (Signed)

## 2023-01-27 NOTE — MAU Provider Note (Signed)
History     CSN: [id number]  Arrival date and time: 01/27/23 1611   Event Date/Time   First Provider Initiated Contact with Patient 01/27/23 1642      Chief Complaint  Patient presents with   Abdominal Pain   HPI  Briana Lynch is a 27 y.o. G2P0101 at [redacted]w[redacted]d who presents for evaluation of lower abdominal cramping. Patient reports she is having painful cramps irregularly since last night.  Patient rates the pain as a 6/10 and has not tried anything for the pain.  She denies any vaginal bleeding, discharge, and leaking of fluid. Denies any constipation, diarrhea or any urinary complaints. Reports normal fetal movement.   OB History     Gravida  2   Para  1   Term      Preterm  1   AB      Living  1      SAB      IAB      Ectopic      Multiple  0   Live Births  1           Past Medical History:  Diagnosis Date   Ankle sprain and strain 11/15/2010   Anxiety state 12/09/2012   Caustic injury gastritis    Depression    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   Diagnosed at an outside hospital with swabs in September 2016, second lesion May 24th 2017   Iron deficiency anemia 10/19/2014   PCOS (polycystic ovarian syndrome)    Suicide attempt (HCC) 10/23/2020   Urinary incontinence 12/11/2012   Vitamin D deficiency 08/20/2014    Past Surgical History:  Procedure Laterality Date   CERVICAL CERCLAGE N/A 12/24/2022   Procedure: CERCLAGE CERVICAL;  Surgeon: Catalina Antigua, MD;  Location: [facility] LD ORS;  Service: Gynecology;  Laterality: N/A;   ESOPHAGOGASTRODUODENOSCOPY (EGD) WITH PROPOFOL N/A 10/24/2020   Procedure: ESOPHAGOGASTRODUODENOSCOPY (EGD) WITH PROPOFOL;  Surgeon: Beverley Fiedler, MD;  Location: [facility] ENDOSCOPY;  Service: Gastroenterology;  Laterality: N/A;    Family History  Problem Relation Age of Onset   Diabetes Mother    Hypertension Mother    Mental illness Sister     Social History   Tobacco Use   Smoking status: Every Day     Current packs/day: 0.50    Types: Cigarettes   Smokeless tobacco: Never   Tobacco comments:    Dad smokes  Vaping Use   Vaping status: Never Used  Substance Use Topics   Alcohol use: Not Currently    Alcohol/week: 0.0 standard drinks of alcohol   Drug use: Yes    Types: Marijuana    Comment: infrequent    Allergies:  Allergies  Allergen Reactions   Penicillins Other (See Comments)    Did it involve swelling of the face/tongue/throat, SOB, or low BP? No Did it involve sudden or severe rash/hives, skin peeling, or any reaction on the inside of your mouth or nose? No Did you need to seek medical attention at a hospital or doctor's office? No When did it last happen? Never      If all above answers are "NO", may proceed with cephalosporin use.  Unknown-pt's sister is allergic    Medications Prior to Admission  Medication Sig Dispense Refill Last Dose   ferrous sulfate 325 (65 FE) MG EC tablet Take 1 tablet (325 mg total) by mouth every other day. 30 tablet 2    Prenatal Vit-Fe Fumarate-FA (PRENATAL MULTIVITAMIN) TABS tablet Take 1  tablet by mouth daily.      promethazine (PHENERGAN) 25 MG tablet Take 1 tablet (25 mg total) by mouth every 6 (six) hours as needed for nausea or vomiting. 30 tablet 2     Review of Systems  Constitutional: Negative.  Negative for fatigue and fever.  HENT: Negative.    Respiratory: Negative.  Negative for shortness of breath.   Cardiovascular: Negative.  Negative for chest pain.  Gastrointestinal:  Positive for abdominal pain. Negative for constipation, diarrhea, nausea and vomiting.  Genitourinary: Negative.  Negative for dysuria, vaginal bleeding and vaginal discharge.  Neurological: Negative.  Negative for dizziness and headaches.   Physical Exam   Blood pressure 107/61, pulse (!) 101, temperature 98 F (36.7 C), temperature source Oral, resp. rate 19, height 5\' 8"  (1.727 m), weight 130.7 kg, last menstrual period 09/09/2022, SpO2 98%, not  currently breastfeeding.  Patient Vitals for the past 24 hrs:  BP Temp Temp src Pulse Resp SpO2 Height Weight  01/27/23 1625 107/61 98 F (36.7 C) Oral (!) 101 19 98 % -- --  01/27/23 1618 -- -- -- -- -- -- 5\' 8"  (1.727 m) 130.7 kg    Physical Exam Vitals and nursing note reviewed.  Constitutional:      General: She is not in acute distress.    Appearance: She is well-developed.  HENT:     Head: Normocephalic.  Eyes:     Pupils: Pupils are equal, round, and reactive to light.  Cardiovascular:     Rate and Rhythm: Normal rate and regular rhythm.     Heart sounds: Normal heart sounds.  Pulmonary:     Effort: Pulmonary effort is normal. No respiratory distress.     Breath sounds: Normal breath sounds.  Abdominal:     General: Bowel sounds are normal. There is no distension.     Palpations: Abdomen is soft.     Tenderness: There is no abdominal tenderness.  Genitourinary:    Comments: Pelvic exam: Cervix pink, visually closed, without lesion, scant white creamy discharge, vaginal walls and external genitalia normal  Cerclage in place with no tension noted Skin:    General: Skin is warm and dry.  Neurological:     Mental Status: She is alert and oriented to person, place, and time.     Motor: No abnormal muscle tone.     Coordination: Coordination normal.     Deep Tendon Reflexes: Reflexes are normal and symmetric. Reflexes normal.  Psychiatric:        Mood and Affect: Mood normal.        Behavior: Behavior normal.        Thought Content: Thought content normal.        Judgment: Judgment normal.     FHT: 144 bpm  Dilation: Closed (visually, cerclage intact) Exam by:: Cleone Slim, CNM   MAU Course  Procedures  Results for orders placed or performed during the hospital encounter of 01/27/23 (from the past 24 hour(s))  Urinalysis, Routine w reflex microscopic -Urine, Clean Catch     Status: Abnormal   Collection Time: 01/27/23  4:41 PM  Result Value Ref Range    Color, Urine YELLOW YELLOW   APPearance CLOUDY (A) CLEAR   Specific Gravity, Urine 1.026 1.005 - 1.030   pH 6.0 5.0 - 8.0   Glucose, UA NEGATIVE NEGATIVE mg/dL   Hgb urine dipstick NEGATIVE NEGATIVE   Bilirubin Urine NEGATIVE NEGATIVE   Ketones, ur NEGATIVE NEGATIVE mg/dL   Protein, ur 30 (A)  NEGATIVE mg/dL   Nitrite NEGATIVE NEGATIVE   Leukocytes,Ua TRACE (A) NEGATIVE   RBC / HPF 11-20 0 - 5 RBC/hpf   WBC, UA 6-10 0 - 5 WBC/hpf   Bacteria, UA RARE (A) NONE SEEN   Squamous Epithelial / HPF 11-20 0 - 5 /HPF   Mucus PRESENT    Ca Oxalate Crys, UA PRESENT      MDM Labs ordered and reviewed.   UA Cerclage in place Patient declines pain medication at this time.   CNM emphasized importance of PO hydration and reassurance provided of exam.  Assessment and Plan   1. Abdominal cramping affecting pregnancy   2. [redacted] weeks gestation of pregnancy   3. Cervical cerclage suture present in second trimester     -Discharge home in stable condition -Second trimester precautions discussed -Patient advised to follow-up with OB as scheduled for prenatal care -Patient may return to MAU as needed or if her condition were to change or worsen  Rolm Bookbinder, CNM 01/27/2023, 5:31 PM

## 2023-01-27 NOTE — MAU Note (Signed)
Briana Lynch is a 27 y.o. at [redacted]w[redacted]d here in MAU reporting: she has sharp lower abdominal cramping that's intermittent.  Reports also feels abdomen get "real real tight" at times.  Also states has episodes of light headedness, but mostly concerned about cramping.  Denies VB or LOF.  Reports hasn't felt FM today, but begun to fell FM. LMP: NA Onset of complaint: a few days ago Pain score: 6 Vitals:   01/27/23 1625  BP: 107/61  Pulse: (!) 101  Resp: 19  Temp: 98 F (36.7 C)  SpO2: 98%     [id number] d/t maternal apparel Lab orders placed from triage:   UA

## 2023-01-30 ENCOUNTER — Other Ambulatory Visit: Payer: Self-pay | Admitting: *Deleted

## 2023-01-30 ENCOUNTER — Encounter: Payer: Self-pay | Admitting: *Deleted

## 2023-01-30 ENCOUNTER — Ambulatory Visit: Payer: MEDICAID | Attending: Family Medicine

## 2023-01-30 ENCOUNTER — Ambulatory Visit: Payer: MEDICAID | Admitting: *Deleted

## 2023-01-30 VITALS — BP 105/55 | HR 93

## 2023-01-30 DIAGNOSIS — O09899 Supervision of other high risk pregnancies, unspecified trimester: Secondary | ICD-10-CM

## 2023-01-30 DIAGNOSIS — N883 Incompetence of cervix uteri: Secondary | ICD-10-CM | POA: Diagnosis present

## 2023-01-30 DIAGNOSIS — O3432 Maternal care for cervical incompetence, second trimester: Secondary | ICD-10-CM

## 2023-01-30 DIAGNOSIS — O99810 Abnormal glucose complicating pregnancy: Secondary | ICD-10-CM | POA: Diagnosis not present

## 2023-01-30 DIAGNOSIS — O0991 Supervision of high risk pregnancy, unspecified, first trimester: Secondary | ICD-10-CM | POA: Insufficient documentation

## 2023-01-30 DIAGNOSIS — Z362 Encounter for other antenatal screening follow-up: Secondary | ICD-10-CM

## 2023-01-30 DIAGNOSIS — Z3A19 19 weeks gestation of pregnancy: Secondary | ICD-10-CM

## 2023-01-30 DIAGNOSIS — O99212 Obesity complicating pregnancy, second trimester: Secondary | ICD-10-CM | POA: Diagnosis not present

## 2023-01-30 DIAGNOSIS — E669 Obesity, unspecified: Secondary | ICD-10-CM

## 2023-01-30 DIAGNOSIS — D563 Thalassemia minor: Secondary | ICD-10-CM | POA: Insufficient documentation

## 2023-01-30 DIAGNOSIS — O09213 Supervision of pregnancy with history of pre-term labor, third trimester: Secondary | ICD-10-CM | POA: Diagnosis not present

## 2023-02-07 ENCOUNTER — Other Ambulatory Visit: Payer: Self-pay

## 2023-02-07 ENCOUNTER — Encounter: Payer: Self-pay | Admitting: Family Medicine

## 2023-02-07 ENCOUNTER — Ambulatory Visit (INDEPENDENT_AMBULATORY_CARE_PROVIDER_SITE_OTHER): Payer: Medicaid Other | Admitting: Family Medicine

## 2023-02-07 VITALS — BP 106/70 | HR 91 | Wt 263.4 lb

## 2023-02-07 DIAGNOSIS — O3431 Maternal care for cervical incompetence, first trimester: Secondary | ICD-10-CM

## 2023-02-07 DIAGNOSIS — O3432 Maternal care for cervical incompetence, second trimester: Secondary | ICD-10-CM

## 2023-02-07 DIAGNOSIS — O09899 Supervision of other high risk pregnancies, unspecified trimester: Secondary | ICD-10-CM

## 2023-02-07 DIAGNOSIS — R7303 Prediabetes: Secondary | ICD-10-CM

## 2023-02-07 DIAGNOSIS — O99012 Anemia complicating pregnancy, second trimester: Secondary | ICD-10-CM

## 2023-02-07 DIAGNOSIS — O0991 Supervision of high risk pregnancy, unspecified, first trimester: Secondary | ICD-10-CM

## 2023-02-07 MED ORDER — PROGESTERONE 200 MG PO CAPS
ORAL_CAPSULE | ORAL | 5 refills | Status: DC
Start: 2023-02-07 — End: 2023-03-06

## 2023-02-07 NOTE — Progress Notes (Signed)
   Subjective:  Briana Lynch is a 27 y.o. G2P0101 at [redacted]w[redacted]d being seen today for ongoing prenatal care.  She is currently monitored for the following issues for this high-risk pregnancy and has PCOS (polycystic ovarian syndrome); Prediabetes; Anemia in pregnancy, second trimester; Cervical insufficiency in pregnancy, antepartum, first trimester; History of preterm delivery, currently pregnant; Supervision of high risk pregnancy in first trimester; Obesity affecting pregnancy; Cervical cerclage suture present in second trimester; and Alpha thalassemia silent carrier on their problem list.  Patient reports no complaints.  Contractions: Irritability. Vag. Bleeding: None.  Movement: Present. Denies leaking of fluid.   The following portions of the patient's history were reviewed and updated as appropriate: allergies, current medications, past family history, past medical history, past social history, past surgical history and problem list. Problem list updated.  Objective:   Vitals:   02/07/23 1647  BP: 106/70  Pulse: 91  Weight: 263 lb 6.4 oz (119.5 kg)    Fetal Status: Fetal Heart Rate (bpm): 140   Movement: Present     General:  Alert, oriented and cooperative. Patient is in no acute distress.  Skin: Skin is warm and dry. No rash noted.   Cardiovascular: Normal heart rate noted  Respiratory: Normal respiratory effort, no problems with respiration noted  Abdomen: Soft, gravid, appropriate for gestational age. Pain/Pressure: Absent     Pelvic: Vag. Bleeding: None     Cervical exam deferred        Extremities: Normal range of motion.     Mental Status: Normal mood and affect. Normal behavior. Normal judgment and thought content.   Urinalysis:      Assessment and Plan:  Pregnancy: G2P0101 at [redacted]w[redacted]d  1. Supervision of high risk pregnancy in first trimester BP and FHR normal Up to date on labs  2. Cervical cerclage suture present in second trimester Cervical insufficiency in G1 with  delivery at 23 weeks, infant survived and is doing well Cerclage placed at 13 weeks Last [place] 01/30/2023, CL normal After visit noted she is not yet on vaginal progesterone, will send rx and message patient  3. Anemia in pregnancy, second trimester Lab Results  Component Value Date   HGB 10.5 (L) 12/24/2022   Mild, PO iron  4. Prediabetes Initial A1c 6.1%, early 2hr GTT was normal  Preterm labor symptoms and general obstetric precautions including but not limited to vaginal bleeding, contractions, leaking of fluid and fetal movement were reviewed in detail with the patient. Please refer to After Visit Summary for other counseling recommendations.  Return in 4 weeks (on 03/07/2023) for Dyad patient, ob visit.   Venora Maples, MD

## 2023-02-07 NOTE — Patient Instructions (Signed)

## 2023-02-28 ENCOUNTER — Encounter: Payer: Self-pay | Admitting: *Deleted

## 2023-03-06 ENCOUNTER — Ambulatory Visit: Payer: MEDICAID | Admitting: *Deleted

## 2023-03-06 ENCOUNTER — Ambulatory Visit (INDEPENDENT_AMBULATORY_CARE_PROVIDER_SITE_OTHER): Payer: MEDICAID | Admitting: Family Medicine

## 2023-03-06 ENCOUNTER — Ambulatory Visit: Payer: MEDICAID | Attending: Maternal & Fetal Medicine

## 2023-03-06 ENCOUNTER — Other Ambulatory Visit: Payer: Self-pay | Admitting: *Deleted

## 2023-03-06 VITALS — BP 112/68 | HR 88

## 2023-03-06 VITALS — BP 113/70 | HR 103 | Wt 275.2 lb

## 2023-03-06 DIAGNOSIS — O09212 Supervision of pregnancy with history of pre-term labor, second trimester: Secondary | ICD-10-CM | POA: Diagnosis not present

## 2023-03-06 DIAGNOSIS — O3431 Maternal care for cervical incompetence, first trimester: Secondary | ICD-10-CM

## 2023-03-06 DIAGNOSIS — O99212 Obesity complicating pregnancy, second trimester: Secondary | ICD-10-CM | POA: Diagnosis not present

## 2023-03-06 DIAGNOSIS — O99810 Abnormal glucose complicating pregnancy: Secondary | ICD-10-CM

## 2023-03-06 DIAGNOSIS — O3432 Maternal care for cervical incompetence, second trimester: Secondary | ICD-10-CM | POA: Diagnosis not present

## 2023-03-06 DIAGNOSIS — O09892 Supervision of other high risk pregnancies, second trimester: Secondary | ICD-10-CM

## 2023-03-06 DIAGNOSIS — O0992 Supervision of high risk pregnancy, unspecified, second trimester: Secondary | ICD-10-CM

## 2023-03-06 DIAGNOSIS — Z3A24 24 weeks gestation of pregnancy: Secondary | ICD-10-CM

## 2023-03-06 DIAGNOSIS — O99012 Anemia complicating pregnancy, second trimester: Secondary | ICD-10-CM

## 2023-03-06 DIAGNOSIS — O0991 Supervision of high risk pregnancy, unspecified, first trimester: Secondary | ICD-10-CM

## 2023-03-06 DIAGNOSIS — Z362 Encounter for other antenatal screening follow-up: Secondary | ICD-10-CM | POA: Diagnosis present

## 2023-03-06 DIAGNOSIS — O09899 Supervision of other high risk pregnancies, unspecified trimester: Secondary | ICD-10-CM

## 2023-03-06 DIAGNOSIS — E669 Obesity, unspecified: Secondary | ICD-10-CM

## 2023-03-06 MED ORDER — PROGESTERONE 200 MG PO CAPS
ORAL_CAPSULE | ORAL | 5 refills | Status: DC
Start: 2023-03-06 — End: 2023-05-22

## 2023-03-06 NOTE — Progress Notes (Signed)
   PRENATAL VISIT NOTE  Subjective:  Briana Lynch is a 27 y.o. G2P0101 at [redacted]w[redacted]d being seen today for ongoing prenatal care.  She is currently monitored for the following issues for this high-risk pregnancy and has PCOS (polycystic ovarian syndrome); Anemia in pregnancy, second trimester; Cervical insufficiency in pregnancy, antepartum, first trimester; History of preterm delivery, currently pregnant; Supervision of high risk pregnancy in first trimester; Obesity affecting pregnancy; Cervical cerclage suture present in second trimester; and Alpha thalassemia silent carrier on their problem list.  Patient reports no complaints.  Contractions: Not present. Vag. Bleeding: None.  Movement: Present. Denies leaking of fluid.   The following portions of the patient's history were reviewed and updated as appropriate: allergies, current medications, past family history, past medical history, past social history, past surgical history and problem list.   Objective:   Vitals:   03/06/23 1458  BP: 113/70  Pulse: (!) 103  Weight: 275 lb 3.2 oz (124.8 kg)    Fetal Status: Fetal Heart Rate (bpm): 142 Fundal Height: 24 cm Movement: Present     General:  Alert, oriented and cooperative. Patient is in no acute distress.  Skin: Skin is warm and dry. No rash noted.   Cardiovascular: Normal heart rate noted  Respiratory: Normal respiratory effort, no problems with respiration noted  Abdomen: Soft, gravid, appropriate for gestational age.  Pain/Pressure: Absent     Pelvic: Cervical exam deferred        Extremities: Normal range of motion.  Edema: None  Mental Status: Normal mood and affect. Normal behavior. Normal judgment and thought content.   Assessment and Plan:  Pregnancy: G2P0101 at [redacted]w[redacted]d 1. Cervical cerclage suture present in second trimester In June 2024  2. Supervision of high risk pregnancy in first trimester Up to date Feeling movement  3. Obesity affecting pregnancy in second  trimester, unspecified obesity type TWG= 3 lb 3.2 oz (1.452 kg) which is appropriate  4. History of preterm delivery, currently pregnant Cerclage in place Rx for progresterone placed on 7/25 but patient never picked up Resent today Discussed r/b of vaginal progesterone  5. Anemia in pregnancy, second trimester Lab Results  Component Value Date   HGB 10.5 (L) 12/24/2022   HGB 9.8 (L) 12/18/2022  - on ferrous sulfate  Preterm labor symptoms and general obstetric precautions including but not limited to vaginal bleeding, contractions, leaking of fluid and fetal movement were reviewed in detail with the patient. Please refer to After Visit Summary for other counseling recommendations.   Return in about 4 weeks (around 04/03/2023) for Routine prenatal care, Mom[facility]Baby Combined Care.  Future Appointments  Date Time Provider Department Center  03/06/2023  3:30 PM [facility] NURSE [facility]  03/06/2023  3:45 PM [facility] US5 [facility]  04/01/2023  8:50 AM [facility] LAB [facility]  04/01/2023  8:55 AM Alvester Morin, Isa Rankin, MD [facility]    Federico Flake, MD

## 2023-03-07 ENCOUNTER — Encounter: Payer: MEDICAID | Admitting: Family Medicine

## 2023-03-29 ENCOUNTER — Other Ambulatory Visit: Payer: Self-pay

## 2023-03-29 DIAGNOSIS — O0991 Supervision of high risk pregnancy, unspecified, first trimester: Secondary | ICD-10-CM

## 2023-04-01 ENCOUNTER — Ambulatory Visit: Payer: MEDICAID | Admitting: Family Medicine

## 2023-04-01 ENCOUNTER — Other Ambulatory Visit: Payer: MEDICAID

## 2023-04-01 ENCOUNTER — Other Ambulatory Visit: Payer: Self-pay

## 2023-04-01 ENCOUNTER — Other Ambulatory Visit (HOSPITAL_COMMUNITY)
Admission: RE | Admit: 2023-04-01 | Discharge: 2023-04-01 | Disposition: A | Discharge status: Home / Self Care | Payer: MEDICAID | Source: Ambulatory Visit | Attending: Family Medicine | Admitting: Family Medicine

## 2023-04-01 VITALS — BP 112/72 | HR 105 | Wt 262.8 lb

## 2023-04-01 DIAGNOSIS — B9689 Other specified bacterial agents as the cause of diseases classified elsewhere: Secondary | ICD-10-CM | POA: Insufficient documentation

## 2023-04-01 DIAGNOSIS — O0991 Supervision of high risk pregnancy, unspecified, first trimester: Secondary | ICD-10-CM | POA: Insufficient documentation

## 2023-04-01 DIAGNOSIS — O0992 Supervision of high risk pregnancy, unspecified, second trimester: Secondary | ICD-10-CM | POA: Insufficient documentation

## 2023-04-01 DIAGNOSIS — O98812 Other maternal infectious and parasitic diseases complicating pregnancy, second trimester: Secondary | ICD-10-CM | POA: Diagnosis not present

## 2023-04-01 DIAGNOSIS — O99012 Anemia complicating pregnancy, second trimester: Secondary | ICD-10-CM

## 2023-04-01 DIAGNOSIS — B3731 Acute candidiasis of vulva and vagina: Secondary | ICD-10-CM | POA: Insufficient documentation

## 2023-04-01 DIAGNOSIS — O3432 Maternal care for cervical incompetence, second trimester: Secondary | ICD-10-CM

## 2023-04-01 DIAGNOSIS — O23592 Infection of other part of genital tract in pregnancy, second trimester: Secondary | ICD-10-CM | POA: Diagnosis not present

## 2023-04-01 DIAGNOSIS — O09899 Supervision of other high risk pregnancies, unspecified trimester: Secondary | ICD-10-CM

## 2023-04-01 DIAGNOSIS — O3431 Maternal care for cervical incompetence, first trimester: Secondary | ICD-10-CM

## 2023-04-01 DIAGNOSIS — O99213 Obesity complicating pregnancy, third trimester: Secondary | ICD-10-CM

## 2023-04-01 DIAGNOSIS — Z3A27 27 weeks gestation of pregnancy: Secondary | ICD-10-CM

## 2023-04-01 DIAGNOSIS — Z202 Contact with and (suspected) exposure to infections with a predominantly sexual mode of transmission: Secondary | ICD-10-CM | POA: Insufficient documentation

## 2023-04-01 DIAGNOSIS — Z23 Encounter for immunization: Secondary | ICD-10-CM | POA: Diagnosis not present

## 2023-04-01 DIAGNOSIS — O99212 Obesity complicating pregnancy, second trimester: Secondary | ICD-10-CM

## 2023-04-01 DIAGNOSIS — O09892 Supervision of other high risk pregnancies, second trimester: Secondary | ICD-10-CM

## 2023-04-01 NOTE — Progress Notes (Signed)
   PRENATAL VISIT NOTE  Subjective:  Briana Lynch is a 27 y.o. G2P0101 at [redacted]w[redacted]d being seen today for ongoing prenatal care.  She is currently monitored for the following issues for this high-risk pregnancy and has PCOS (polycystic ovarian syndrome); Anemia in pregnancy, second trimester; Cervical insufficiency in pregnancy, antepartum, first trimester; History of preterm delivery, currently pregnant; Supervision of high risk pregnancy in first trimester; Obesity affecting pregnancy; Cervical cerclage suture present in second trimester; and Alpha thalassemia silent carrier on their problem list.  Patient reports no complaints.  Contractions: Not present. Vag. Bleeding: None.  Movement: Present. Denies leaking of fluid.   The following portions of the patient's history were reviewed and updated as appropriate: allergies, current medications, past family history, past medical history, past social history, past surgical history and problem list.   Objective:   Vitals:   04/01/23 0910  BP: 112/72  Pulse: (!) 105  Weight: 262 lb 12.8 oz (119.2 kg)    Fetal Status: Fetal Heart Rate (bpm): 150   Movement: Present     General:  Alert, oriented and cooperative. Patient is in no acute distress.  Skin: Skin is warm and dry. No rash noted.   Cardiovascular: Normal heart rate noted  Respiratory: Normal respiratory effort, no problems with respiration noted  Abdomen: Soft, gravid, appropriate for gestational age.  Pain/Pressure: Absent     Pelvic: Cervical exam deferred        Extremities: Normal range of motion.     Mental Status: Normal mood and affect. Normal behavior. Normal judgment and thought content.   Assessment and Plan:  Pregnancy: G2P0101 at [redacted]w[redacted]d 1. Anemia in pregnancy, second trimester Lab Results  Component Value Date   HGB 10.5 (L) 12/24/2022   HGB 9.8 (L) 12/18/2022  On FE Repeat CBC with lab this week  2. Cervical cerclage suture present in second trimester   3.  Cervical insufficiency in pregnancy, antepartum, first trimester Cerclage ASAP  4. History of preterm delivery, currently pregnant @24  wks  5. Obesity affecting pregnancy in third trimester, unspecified obesity type TWG=-9 lb 3.2 oz (-4.173 kg)   6. Supervision of high risk pregnancy in first trimester Up to date Vigorous movement Interested in doula and possibly waterbirth Referral placed today for [facility] [place] with patient in the room  - Pt is interested in waterbirth.  No contraindications at this time per chart review/patient assessment.   - Pt to enroll in class, see CNMs for most visits in the office.  - Discussed waterbirth as option for low-risk pregnancy.  Reviewed conditions that may arise during pregnancy that will risk pt out of waterbirth including hypertension, diabetes, fetal growth restriction <10%ile, etc.    Preterm labor symptoms and general obstetric precautions including but not limited to vaginal bleeding, contractions, leaking of fluid and fetal movement were reviewed in detail with the patient. Please refer to After Visit Summary for other counseling recommendations.   Return in about 2 days (around 04/03/2023) for 28 wk labs, lab only visit.  Future Appointments  Date Time Provider Department Center  04/03/2023  2:30 PM [facility] US6 [facility]  04/12/2023  8:50 AM [facility] LAB [facility]  04/16/2023  1:35 PM Venora Maples, MD [facility]  05/01/2023  2:30 PM [facility] US4 [facility]    Federico Flake, MD

## 2023-04-01 NOTE — Patient Instructions (Addendum)
Considering Waterbirth? Guide for patients at Center for [facility]) Why consider waterbirth? Gentle birth for babies  Less pain medicine used in labor  May allow for passive descent/less pushing  May reduce perineal tears  More mobility and instinctive maternal position changes  Increased maternal relaxation   Is waterbirth safe? What are the risks of infection, drowning or other complications? Infection:  Very low risk (3.7 % for tub vs 4.8% for bed)  7 in 8000 waterbirths with documented infection  Poorly cleaned equipment most common cause  Slightly lower group B strep transmission rate  Drowning  Maternal:  Very low risk  Related to seizures or fainting  Newborn:  Very low risk. No evidence of increased risk of respiratory problems in multiple large studies  Physiological protection from breathing under water  Avoid underwater birth if there are any fetal complications  Once baby's head is out of the water, keep it out.  Birth complication  Some reports of cord trauma, but risk decreased by bringing baby to surface gradually  No evidence of increased risk of shoulder dystocia. Mothers can usually change positions faster in water than in a bed, possibly aiding the maneuvers to free the shoulder.   There are 2 things you MUST do to have a waterbirth with [facility]: Attend a waterbirth class at [facility]   3rd Wednesday of every month from 7-9 pm (virtual during COVID) [employer] at [email or web address] or [email or web address] or by calling [phone] Bring [place] the certificate from the class to your prenatal appointment or send via MyChart Meet with a midwife at 36 weeks* to see if you can still plan a waterbirth and to sign the consent.   *We also recommend that you schedule as many of your prenatal visits with a midwife as possible.    Helpful information: You may want to bring a bathing suit top to the hospital  to wear during labor but this is optional.  All other supplies are provided by the hospital. Please arrive at the hospital with signs of active labor, and do not wait at home until late in labor. It takes 45 min- 1 hour for fetal monitoring, and check in to your room to take place, plus transport and filling of the waterbirth tub.    Things that would prevent you from having a waterbirth: Premature, <37wks  Previous cesarean birth  Presence of thick meconium-stained fluid  Multiple gestation (Twins, triplets, etc.)  Uncontrolled diabetes or gestational diabetes requiring medication  Hypertension diagnosed in pregnancy or preexisting hypertension (gestational hypertension, preeclampsia, or chronic hypertension) Fetal growth restriction (your baby measures less than 10th percentile on ultrasound) Heavy vaginal bleeding  Non-reassuring fetal heart rate  Active infection (MRSA, etc.). Group B Strep is NOT a contraindication for waterbirth.  If your labor has to be induced and induction method requires continuous monitoring of the baby's heart rate  Other risks/issues identified by your obstetrical provider   Please remember that birth is unpredictable. Under certain unforeseeable circumstances your provider may advise against giving birth in the tub. These decisions will be made on a case-by-case basis and with the safety of you and your baby as our highest priority.    Updated 10/18/21

## 2023-04-02 ENCOUNTER — Encounter: Payer: Self-pay | Admitting: *Deleted

## 2023-04-02 LAB — CERVICOVAGINAL ANCILLARY ONLY
Bacterial Vaginitis (gardnerella): POSITIVE — AB
Candida Glabrata: NEGATIVE
Candida Vaginitis: POSITIVE — AB
Chlamydia: NEGATIVE
Comment: NEGATIVE
Comment: NEGATIVE
Comment: NEGATIVE
Comment: NEGATIVE
Comment: NEGATIVE
Comment: NORMAL
Neisseria Gonorrhea: NEGATIVE
Trichomonas: NEGATIVE

## 2023-04-02 MED ORDER — METRONIDAZOLE 500 MG PO TABS
500.0000 mg | ORAL_TABLET | Freq: Two times a day (BID) | ORAL | 0 refills | Status: DC
Start: 2023-04-02 — End: 2023-05-14

## 2023-04-02 MED ORDER — FLUCONAZOLE 150 MG PO TABS
150.0000 mg | ORAL_TABLET | Freq: Once | ORAL | 0 refills | Status: AC
Start: 2023-04-02 — End: 2023-04-02

## 2023-04-02 NOTE — Addendum Note (Signed)
Addended by: Geanie Berlin on: 04/02/2023 09:29 PM   Modules accepted: Orders

## 2023-04-03 ENCOUNTER — Ambulatory Visit: Payer: MEDICAID

## 2023-04-03 ENCOUNTER — Telehealth: Payer: Self-pay

## 2023-04-03 MED ORDER — FLUCONAZOLE 150 MG PO TABS: 150.0000 mg | ORAL_TABLET | Freq: Once | ORAL | 0 refills | Status: AC

## 2023-04-03 NOTE — Telephone Encounter (Signed)
No answer, no VM left. Will send mychart message with results and Rx.  Judeth Cornfield, RNC

## 2023-04-03 NOTE — Telephone Encounter (Signed)
-----   Message from Federico Flake sent at 04/02/2023  9:29 PM EDT ----- BV and Yeast present.  I have sent in medication for the patient.

## 2023-04-08 ENCOUNTER — Ambulatory Visit: Payer: MEDICAID | Attending: Maternal & Fetal Medicine

## 2023-04-08 DIAGNOSIS — O99212 Obesity complicating pregnancy, second trimester: Secondary | ICD-10-CM | POA: Diagnosis present

## 2023-04-08 DIAGNOSIS — O99213 Obesity complicating pregnancy, third trimester: Secondary | ICD-10-CM

## 2023-04-08 DIAGNOSIS — O99810 Abnormal glucose complicating pregnancy: Secondary | ICD-10-CM | POA: Diagnosis not present

## 2023-04-08 DIAGNOSIS — O3433 Maternal care for cervical incompetence, third trimester: Secondary | ICD-10-CM

## 2023-04-08 DIAGNOSIS — E669 Obesity, unspecified: Secondary | ICD-10-CM | POA: Diagnosis not present

## 2023-04-08 DIAGNOSIS — O3432 Maternal care for cervical incompetence, second trimester: Secondary | ICD-10-CM | POA: Diagnosis present

## 2023-04-08 DIAGNOSIS — O09213 Supervision of pregnancy with history of pre-term labor, third trimester: Secondary | ICD-10-CM

## 2023-04-08 DIAGNOSIS — Z3A28 28 weeks gestation of pregnancy: Secondary | ICD-10-CM

## 2023-04-09 ENCOUNTER — Other Ambulatory Visit: Payer: Self-pay

## 2023-04-09 ENCOUNTER — Other Ambulatory Visit: Payer: Self-pay | Admitting: *Deleted

## 2023-04-09 DIAGNOSIS — O3433 Maternal care for cervical incompetence, third trimester: Secondary | ICD-10-CM

## 2023-04-09 DIAGNOSIS — O0991 Supervision of high risk pregnancy, unspecified, first trimester: Secondary | ICD-10-CM

## 2023-04-09 DIAGNOSIS — O99213 Obesity complicating pregnancy, third trimester: Secondary | ICD-10-CM

## 2023-04-12 ENCOUNTER — Other Ambulatory Visit: Payer: MEDICAID

## 2023-04-16 ENCOUNTER — Encounter: Payer: Self-pay | Admitting: Family Medicine

## 2023-04-16 ENCOUNTER — Other Ambulatory Visit: Payer: Self-pay

## 2023-04-16 ENCOUNTER — Ambulatory Visit (INDEPENDENT_AMBULATORY_CARE_PROVIDER_SITE_OTHER): Payer: MEDICAID | Admitting: Family Medicine

## 2023-04-16 VITALS — BP 105/74 | HR 98 | Wt 273.3 lb

## 2023-04-16 DIAGNOSIS — O0991 Supervision of high risk pregnancy, unspecified, first trimester: Secondary | ICD-10-CM

## 2023-04-16 DIAGNOSIS — R7309 Other abnormal glucose: Secondary | ICD-10-CM

## 2023-04-16 DIAGNOSIS — O99213 Obesity complicating pregnancy, third trimester: Secondary | ICD-10-CM

## 2023-04-16 DIAGNOSIS — Z3A29 29 weeks gestation of pregnancy: Secondary | ICD-10-CM

## 2023-04-16 DIAGNOSIS — B009 Herpesviral infection, unspecified: Secondary | ICD-10-CM

## 2023-04-16 DIAGNOSIS — O0993 Supervision of high risk pregnancy, unspecified, third trimester: Secondary | ICD-10-CM

## 2023-04-16 DIAGNOSIS — O3433 Maternal care for cervical incompetence, third trimester: Secondary | ICD-10-CM

## 2023-04-16 DIAGNOSIS — O99013 Anemia complicating pregnancy, third trimester: Secondary | ICD-10-CM

## 2023-04-16 DIAGNOSIS — O3432 Maternal care for cervical incompetence, second trimester: Secondary | ICD-10-CM

## 2023-04-16 NOTE — Patient Instructions (Signed)

## 2023-04-16 NOTE — Progress Notes (Signed)
   Subjective:  Briana Lynch is a 27 y.o. G2P0101 at [redacted]w[redacted]d being seen today for ongoing prenatal care.  She is currently monitored for the following issues for this high-risk pregnancy and has Cervical insufficiency in pregnancy, antepartum, first trimester; History of preterm delivery, currently pregnant; Supervision of high risk pregnancy in first trimester; Obesity affecting pregnancy; Cervical cerclage suture present in second trimester; and Alpha thalassemia silent carrier on their problem list.  Patient reports no complaints.  Contractions: Not present. Vag. Bleeding: None.  Movement: Present. Denies leaking of fluid.   The following portions of the patient's history were reviewed and updated as appropriate: allergies, current medications, past family history, past medical history, past social history, past surgical history and problem list. Problem list updated.  Objective:   Vitals:   04/16/23 1340  BP: 105/74  Pulse: 98  Weight: 273 lb 4.8 oz (124 kg)    Fetal Status: Fetal Heart Rate (bpm): 144   Movement: Present     General:  Alert, oriented and cooperative. Patient is in no acute distress.  Skin: Skin is warm and dry. No rash noted.   Cardiovascular: Normal heart rate noted  Respiratory: Normal respiratory effort, no problems with respiration noted  Abdomen: Soft, gravid, appropriate for gestational age. Pain/Pressure: Absent     Pelvic: Vag. Bleeding: None     Cervical exam deferred        Extremities: Normal range of motion.  Edema: Trace  Mental Status: Normal mood and affect. Normal behavior. Normal judgment and thought content.   Urinalysis:      Assessment and Plan:  Pregnancy: G2P0101 at [redacted]w[redacted]d  1. Supervision of high risk pregnancy in first trimester BP and FHR normal Has not yet had GTT or other third trimester labs, will do 1 hour today as not able to return for another two weeks along with CBC/HIV/RPR  2. Obesity affecting pregnancy in third trimester,  unspecified obesity type Start antenatal testing at 34 weeks per MFM  3. Cervical cerclage suture present in second trimester Cervical insufficiency in G1 with delivery at 23 weeks, infant survived and is doing well Cerclage placed at 13 weeks She is taking vaginal prometrium  Preterm labor symptoms and general obstetric precautions including but not limited to vaginal bleeding, contractions, leaking of fluid and fetal movement were reviewed in detail with the patient. Please refer to After Visit Summary for other counseling recommendations.  Return in 2 weeks (on 04/30/2023) for Dyad patient, ob visit.   Venora Maples, MD

## 2023-04-17 ENCOUNTER — Encounter: Payer: Self-pay | Admitting: Family Medicine

## 2023-04-17 ENCOUNTER — Telehealth: Payer: Self-pay | Admitting: Pharmacy Technician

## 2023-04-17 DIAGNOSIS — R7309 Other abnormal glucose: Secondary | ICD-10-CM | POA: Insufficient documentation

## 2023-04-17 LAB — CBC
Hematocrit: 26.4 % — ABNORMAL LOW (ref 34.0–46.6)
Hemoglobin: 8.4 g/dL — ABNORMAL LOW (ref 11.1–15.9)
MCH: 27.5 pg (ref 26.6–33.0)
MCHC: 31.8 g/dL (ref 31.5–35.7)
MCV: 86 fL (ref 79–97)
Platelets: 388 10*3/uL (ref 150–450)
RBC: 3.06 x10E6/uL — ABNORMAL LOW (ref 3.77–5.28)
RDW: 14 % (ref 11.7–15.4)
WBC: 10.6 10*3/uL (ref 3.4–10.8)

## 2023-04-17 LAB — RPR: RPR Ser Ql: NONREACTIVE

## 2023-04-17 LAB — GLUCOSE TOLERANCE, 1 HOUR: Glucose, 1Hr PP: 166 mg/dL (ref 70–199)

## 2023-04-17 LAB — HIV ANTIBODY (ROUTINE TESTING W REFLEX): HIV Screen 4th Generation wRfx: NONREACTIVE

## 2023-04-17 NOTE — Addendum Note (Signed)
Addended by: Merian Capron on: 04/17/2023 09:42 AM   Modules accepted: Orders

## 2023-04-17 NOTE — Telephone Encounter (Signed)
Dr. Crissie Reese,  Patient will be scheduled as soon as possible.  Auth Submission: NO AUTH NEEDED Site of care: Site of care: [facility] Payer: [employer] Medication & CPT/J Code(s) submitted: Venofer (Iron Sucrose) [id number] Route of submission (phone, fax, portal):  Phone # Fax # Auth type: Buy/Bill PB Units/visits requested: x2 Reference number:  Approval from: 04/17/23 to 06/15/23

## 2023-05-01 ENCOUNTER — Ambulatory Visit (INDEPENDENT_AMBULATORY_CARE_PROVIDER_SITE_OTHER): Payer: MEDICAID | Admitting: Family Medicine

## 2023-05-01 ENCOUNTER — Ambulatory Visit: Payer: MEDICAID | Attending: Maternal & Fetal Medicine

## 2023-05-01 ENCOUNTER — Other Ambulatory Visit: Payer: Self-pay

## 2023-05-01 VITALS — BP 107/60 | HR 104 | Wt 272.1 lb

## 2023-05-01 DIAGNOSIS — O99013 Anemia complicating pregnancy, third trimester: Secondary | ICD-10-CM

## 2023-05-01 DIAGNOSIS — O99810 Abnormal glucose complicating pregnancy: Secondary | ICD-10-CM

## 2023-05-01 DIAGNOSIS — O99212 Obesity complicating pregnancy, second trimester: Secondary | ICD-10-CM | POA: Insufficient documentation

## 2023-05-01 DIAGNOSIS — O3432 Maternal care for cervical incompetence, second trimester: Secondary | ICD-10-CM | POA: Diagnosis present

## 2023-05-01 DIAGNOSIS — O0993 Supervision of high risk pregnancy, unspecified, third trimester: Secondary | ICD-10-CM | POA: Diagnosis not present

## 2023-05-01 DIAGNOSIS — Z3A32 32 weeks gestation of pregnancy: Secondary | ICD-10-CM

## 2023-05-01 DIAGNOSIS — O99213 Obesity complicating pregnancy, third trimester: Secondary | ICD-10-CM | POA: Diagnosis not present

## 2023-05-01 DIAGNOSIS — O3433 Maternal care for cervical incompetence, third trimester: Secondary | ICD-10-CM

## 2023-05-01 DIAGNOSIS — R7309 Other abnormal glucose: Secondary | ICD-10-CM

## 2023-05-01 DIAGNOSIS — O3431 Maternal care for cervical incompetence, first trimester: Secondary | ICD-10-CM

## 2023-05-01 DIAGNOSIS — E669 Obesity, unspecified: Secondary | ICD-10-CM

## 2023-05-01 DIAGNOSIS — O09899 Supervision of other high risk pregnancies, unspecified trimester: Secondary | ICD-10-CM

## 2023-05-01 DIAGNOSIS — O09213 Supervision of pregnancy with history of pre-term labor, third trimester: Secondary | ICD-10-CM | POA: Diagnosis not present

## 2023-05-01 DIAGNOSIS — O0991 Supervision of high risk pregnancy, unspecified, first trimester: Secondary | ICD-10-CM

## 2023-05-01 DIAGNOSIS — O09893 Supervision of other high risk pregnancies, third trimester: Secondary | ICD-10-CM

## 2023-05-01 DIAGNOSIS — F4312 Post-traumatic stress disorder, chronic: Secondary | ICD-10-CM

## 2023-05-01 NOTE — Progress Notes (Unsigned)
   PRENATAL VISIT NOTE  Subjective:  Briana Lynch is a 27 y.o. G2P0101 at [redacted]w[redacted]d being seen today for ongoing prenatal care.  She is currently monitored for the following issues for this high-risk pregnancy and has HSV-2 (herpes simplex virus 2) infection; Chronic post-traumatic stress disorder (PTSD); Anemia of pregnancy in third trimester; Cervical insufficiency in pregnancy, antepartum, first trimester; History of preterm delivery, currently pregnant; Supervision of high risk pregnancy in first trimester; Obesity affecting pregnancy; Cervical cerclage suture present in second trimester; Alpha thalassemia silent carrier; and Abnormal GTT (glucose tolerance test) on their problem list.  Patient reports no complaints.  Contractions: Irritability. Vag. Bleeding: None.  Movement: Present. Denies leaking of fluid.   The following portions of the patient's history were reviewed and updated as appropriate: allergies, current medications, past family history, past medical history, past social history, past surgical history and problem list.   Objective:   Vitals:   05/01/23 1535  BP: 107/60  Pulse: (!) 104  Weight: 272 lb 1.6 oz (123.4 kg)    Fetal Status: Fetal Heart Rate (bpm): 149   Movement: Present     General:  Alert, oriented and cooperative. Patient is in no acute distress.  Skin: Skin is warm and dry. No rash noted.   Cardiovascular: Normal heart rate noted  Respiratory: Normal respiratory effort, no problems with respiration noted  Abdomen: Soft, gravid, appropriate for gestational age.  Pain/Pressure: Absent     Pelvic: Cervical exam deferred        Extremities: Normal range of motion.  Edema: Trace  Mental Status: Normal mood and affect. Normal behavior. Normal judgment and thought content.   Assessment and Plan:  Pregnancy: G2P0101 at [redacted]w[redacted]d 1. Supervision of high risk pregnancy in first trimester Needs 3hr GTT-- failed 1 hr (166)--> discussed today and need for 3hr GTT Up  to date otherwise Vigorous movement No concerns today  2. Obesity affecting pregnancy in third trimester, unspecified obesity type TWG=1.6 oz (0.045 kg) which is at goal  3. Chronic post-traumatic stress disorder (PTSD) In therapy  4. Cervical insufficiency in pregnancy, antepartum, first trimester Cercalge in place Removal at 36-37 weeks  5. Abnormal GTT (glucose tolerance test) Needs 3 hr GTT ASAP  6. Anemia of pregnancy in third trimester Lab Results  Component Value Date   HGB 8.4 (L) 04/16/2023   HGB 10.5 (L) 12/24/2022  Has Fe infusions scheduled 11/4 Oral Fe  7. Cervical cerclage suture present in second trimester Removal at 36+ weeks  8. History of preterm delivery, currently pregnant @24wk   Preterm labor symptoms and general obstetric precautions including but not limited to vaginal bleeding, contractions, leaking of fluid and fetal movement were reviewed in detail with the patient. Please refer to After Visit Summary for other counseling recommendations.   Return in about 2 weeks (around 05/15/2023) for Routine prenatal care.  Future Appointments  Date Time Provider Department Center  05/03/2023  8:20 AM [facility]  05/14/2023 10:35 AM Venora Maples, MD [facility]  05/15/2023  3:30 PM [facility] US2 [facility]  05/20/2023 10:00 AM [facility]IR 5 [facility] None  05/22/2023  3:30 PM [facility] US3 [facility]  05/28/2023  2:35 PM Sue Lush, FNP [facility]  05/29/2023 10:30 AM [facility] US5 [facility]  06/04/2023  2:35 PM Sue Lush, FNP [facility]  06/11/2023  2:35 PM Crissie Reese, Mary Sella, MD [facility]    Federico Flake, MD

## 2023-05-02 ENCOUNTER — Encounter: Payer: Self-pay | Admitting: Family Medicine

## 2023-05-03 ENCOUNTER — Other Ambulatory Visit: Payer: MEDICAID

## 2023-05-14 ENCOUNTER — Other Ambulatory Visit: Payer: MEDICAID

## 2023-05-14 ENCOUNTER — Ambulatory Visit (INDEPENDENT_AMBULATORY_CARE_PROVIDER_SITE_OTHER): Payer: MEDICAID | Admitting: Family Medicine

## 2023-05-14 ENCOUNTER — Ambulatory Visit: Payer: MEDICAID

## 2023-05-14 ENCOUNTER — Encounter: Payer: Self-pay | Admitting: Family Medicine

## 2023-05-14 VITALS — BP 104/69 | HR 103 | Wt 273.6 lb

## 2023-05-14 DIAGNOSIS — O3433 Maternal care for cervical incompetence, third trimester: Secondary | ICD-10-CM

## 2023-05-14 DIAGNOSIS — O99213 Obesity complicating pregnancy, third trimester: Secondary | ICD-10-CM

## 2023-05-14 DIAGNOSIS — Z3A33 33 weeks gestation of pregnancy: Secondary | ICD-10-CM | POA: Diagnosis not present

## 2023-05-14 DIAGNOSIS — O0993 Supervision of high risk pregnancy, unspecified, third trimester: Secondary | ICD-10-CM | POA: Diagnosis not present

## 2023-05-14 DIAGNOSIS — O09893 Supervision of other high risk pregnancies, third trimester: Secondary | ICD-10-CM

## 2023-05-14 DIAGNOSIS — R7309 Other abnormal glucose: Secondary | ICD-10-CM

## 2023-05-14 DIAGNOSIS — O3432 Maternal care for cervical incompetence, second trimester: Secondary | ICD-10-CM

## 2023-05-14 DIAGNOSIS — O0991 Supervision of high risk pregnancy, unspecified, first trimester: Secondary | ICD-10-CM

## 2023-05-14 DIAGNOSIS — O09899 Supervision of other high risk pregnancies, unspecified trimester: Secondary | ICD-10-CM

## 2023-05-14 DIAGNOSIS — O99013 Anemia complicating pregnancy, third trimester: Secondary | ICD-10-CM

## 2023-05-14 DIAGNOSIS — F4312 Post-traumatic stress disorder, chronic: Secondary | ICD-10-CM

## 2023-05-14 DIAGNOSIS — B009 Herpesviral infection, unspecified: Secondary | ICD-10-CM

## 2023-05-14 MED ORDER — VALACYCLOVIR HCL 500 MG PO TABS
500.0000 mg | ORAL_TABLET | Freq: Two times a day (BID) | ORAL | 2 refills | Status: DC
Start: 2023-05-14 — End: 2023-06-04

## 2023-05-14 NOTE — Progress Notes (Signed)
Subjective:  Briana Lynch is a 27 y.o. G2P0101 at [redacted]w[redacted]d being seen today for ongoing prenatal care.  She is currently monitored for the following issues for this high-risk pregnancy and has HSV-2 (herpes simplex virus 2) infection; Chronic post-traumatic stress disorder (PTSD); Anemia of pregnancy in third trimester; Cervical insufficiency in pregnancy, antepartum, first trimester; History of preterm delivery, currently pregnant; Supervision of high risk pregnancy in first trimester; Obesity affecting pregnancy; Cervical cerclage suture present in second trimester; Alpha thalassemia silent carrier; and Abnormal GTT (glucose tolerance test) on their problem list.  Patient reports  cramping and contractions .  Contractions: Irritability. Vag. Bleeding: None.  Movement: Present. Denies leaking of fluid.   The following portions of the patient's history were reviewed and updated as appropriate: allergies, current medications, past family history, past medical history, past social history, past surgical history and problem list. Problem list updated.  Objective:   Vitals:   05/14/23 1035  BP: 104/69  Pulse: (!) 103  Weight: 273 lb 9.6 oz (124.1 kg)    Fetal Status: Fetal Heart Rate (bpm): 141   Movement: Present  Presentation: Vertex  General:  Alert, oriented and cooperative. Patient is in no acute distress.  Skin: Skin is warm and dry. No rash noted.   Cardiovascular: Normal heart rate noted  Respiratory: Normal respiratory effort, no problems with respiration noted  Abdomen: Soft, gravid, appropriate for gestational age. Pain/Pressure: Present     Pelvic: Vag. Bleeding: None     Cervical exam performed Dilation: 2 Effacement (%): Thick Station: -2 On speculum exam cervix with multiparous appearance and visually closed, cerclage visually with appropriate tension On SVE cervix is about 2 cm and thick, no significant tension palpated  Extremities: Normal range of motion.     Mental  Status: Normal mood and affect. Normal behavior. Normal judgment and thought content.   Urinalysis:      Assessment and Plan:  Pregnancy: G2P0101 at [redacted]w[redacted]d  1. Supervision of high risk pregnancy in first trimester BP and FHR normal Failed 1hr GTT at 29 weeks, doing 2hr GTT today  2. Cervical cerclage suture present in second trimester Placed at 13 weeks, prior delivery at 23 weeks, infant survived and is doing well Reports cramping and contractions today On speculum exam cervix with multiparous appearance and visually closed, cerclage visually with appropriate tension On SVE cervix is about 2 cm and thick, no significant tension palpated Discussed she is likely having BH as well as discomfort from advancing gestational age, but that it is hard to distinguish from preterm labor which she is at high risk for Instructed her to not overthink it if her symptoms progress and to go to MAU ASAP for eval Discussed if she has any progression we may consider removing cerclage at 36 weeks as opposed to 37 weeks  3. Anemia of pregnancy in third trimester Lab Results  Component Value Date   HGB 8.4 (L) 04/16/2023   Ordered for IV iron, has not yet been but is scheduled for 05/20/23  4. Chronic post-traumatic stress disorder (PTSD)   5. History of preterm delivery, currently pregnant Secondary to cervical insufficiency, del at 23 weeks, cerclage in place  6. HSV-2 (herpes simplex virus 2) infection Start valtrex ppx tomorrow  7. Obesity affecting pregnancy in third trimester, unspecified obesity type Antenatal testing per MFM protocol  Preterm labor symptoms and general obstetric precautions including but not limited to vaginal bleeding, contractions, leaking of fluid and fetal movement were reviewed in detail  with the patient. Please refer to After Visit Summary for other counseling recommendations.  Return in 2 weeks (on 05/28/2023) for Dyad patient, ob visit, needs MD.   Venora Maples, MD

## 2023-05-14 NOTE — Patient Instructions (Signed)

## 2023-05-15 ENCOUNTER — Other Ambulatory Visit: Payer: Self-pay | Admitting: *Deleted

## 2023-05-15 ENCOUNTER — Other Ambulatory Visit: Payer: Self-pay

## 2023-05-15 ENCOUNTER — Ambulatory Visit: Payer: MEDICAID | Attending: Obstetrics

## 2023-05-15 DIAGNOSIS — O99213 Obesity complicating pregnancy, third trimester: Secondary | ICD-10-CM

## 2023-05-15 DIAGNOSIS — O3433 Maternal care for cervical incompetence, third trimester: Secondary | ICD-10-CM | POA: Diagnosis present

## 2023-05-15 DIAGNOSIS — Z3A34 34 weeks gestation of pregnancy: Secondary | ICD-10-CM

## 2023-05-15 DIAGNOSIS — E669 Obesity, unspecified: Secondary | ICD-10-CM

## 2023-05-15 DIAGNOSIS — O09213 Supervision of pregnancy with history of pre-term labor, third trimester: Secondary | ICD-10-CM

## 2023-05-15 DIAGNOSIS — O99810 Abnormal glucose complicating pregnancy: Secondary | ICD-10-CM | POA: Diagnosis not present

## 2023-05-15 LAB — GESTATIONAL GLUCOSE TOLERANCE
Glucose, Fasting: 112 mg/dL — ABNORMAL HIGH (ref 70–94)
Glucose, GTT - 1 Hour: 198 mg/dL — ABNORMAL HIGH (ref 70–179)
Glucose, GTT - 2 Hour: 178 mg/dL — ABNORMAL HIGH (ref 70–154)
Glucose, GTT - 3 Hour: 74 mg/dL (ref 70–139)

## 2023-05-16 ENCOUNTER — Telehealth: Payer: Self-pay

## 2023-05-16 MED ORDER — ACCU-CHEK GUIDE W/DEVICE KIT: 1.0000 | PACK | Freq: Four times a day (QID) | 0 refills | Status: DC

## 2023-05-16 MED ORDER — GLUCOSE BLOOD VI STRP: ORAL_STRIP | 12 refills | Status: DC

## 2023-05-16 MED ORDER — ACCU-CHEK SOFTCLIX LANCETS MISC: 12 refills | Status: DC

## 2023-05-16 NOTE — Telephone Encounter (Signed)
Second attempt to contact patient regarding results and diabetic test supplies that were sent to her pharmacy along with diabetes edu appointment scheduled for 05/23/23 at 1:15.  Danne Harbor RMA

## 2023-05-16 NOTE — Telephone Encounter (Signed)
-----   Message from Federico Flake sent at 05/15/2023  5:27 PM EDT ----- Labs shows GDM. Please call patient to discuss and order supplies/DM education

## 2023-05-16 NOTE — Telephone Encounter (Signed)
Left message for patient to call back for results.  

## 2023-05-20 ENCOUNTER — Ambulatory Visit: Payer: MEDICAID

## 2023-05-20 VITALS — BP 107/70 | HR 91 | Temp 98.2°F | Resp 18 | Ht 68.0 in | Wt 278.6 lb

## 2023-05-20 DIAGNOSIS — O99013 Anemia complicating pregnancy, third trimester: Secondary | ICD-10-CM | POA: Diagnosis not present

## 2023-05-20 DIAGNOSIS — Z3A34 34 weeks gestation of pregnancy: Secondary | ICD-10-CM | POA: Diagnosis not present

## 2023-05-20 DIAGNOSIS — D508 Other iron deficiency anemias: Secondary | ICD-10-CM | POA: Diagnosis not present

## 2023-05-20 MED ORDER — ACETAMINOPHEN 325 MG PO TABS
650.0000 mg | ORAL_TABLET | Freq: Once | ORAL | Status: AC
  Administered 2023-05-20: 650 mg via ORAL
  Filled 2023-05-20: qty 2

## 2023-05-20 MED ORDER — DIPHENHYDRAMINE HCL 25 MG PO CAPS
25.0000 mg | ORAL_CAPSULE | Freq: Once | ORAL | Status: AC
  Administered 2023-05-20: 25 mg via ORAL
  Filled 2023-05-20: qty 1

## 2023-05-20 MED ORDER — IRON SUCROSE 300 MG IVPB - SIMPLE MED
300.0000 mg | Status: DC
  Administered 2023-05-20: 300 mg via INTRAVENOUS
  Filled 2023-05-20: qty 265

## 2023-05-20 NOTE — Progress Notes (Signed)
Diagnosis: Iron Deficiency Anemia  Provider:  Chilton Greathouse MD  Procedure: IV Infusion  IV Type: Peripheral, IV Location: L Antecubital  Venofer (Iron Sucrose), Dose: 300 mg  Infusion Start Time: 1049  Infusion Stop Time: 1232  Post Infusion IV Care: Observation period completed and Peripheral IV Discontinued  Discharge: Condition: Good, Destination: Home . AVS Declined  Performed by:  Rico Ala, LPN

## 2023-05-20 NOTE — Telephone Encounter (Signed)
Was able to reach patient regarding results, diabetic testing supplies and education appointment. Patient is unable to make it on 11/7 but rescheduled for 11/12 at 11:15 due to her work schedule. No questions or concerns during phone call.  Danne Harbor RMA

## 2023-05-22 ENCOUNTER — Ambulatory Visit: Payer: MEDICAID

## 2023-05-22 ENCOUNTER — Inpatient Hospital Stay (HOSPITAL_COMMUNITY): Payer: MEDICAID

## 2023-05-22 ENCOUNTER — Other Ambulatory Visit: Payer: Self-pay | Admitting: Obstetrics

## 2023-05-22 ENCOUNTER — Encounter (HOSPITAL_COMMUNITY): Payer: Self-pay | Admitting: Obstetrics & Gynecology

## 2023-05-22 ENCOUNTER — Ambulatory Visit: Payer: MEDICAID | Admitting: *Deleted

## 2023-05-22 ENCOUNTER — Inpatient Hospital Stay (HOSPITAL_COMMUNITY)
Admission: AD | Admit: 2023-05-22 | Discharge: 2023-05-22 | Disposition: A | Discharge status: Home / Self Care | Payer: MEDICAID | Attending: Obstetrics & Gynecology | Admitting: Obstetrics & Gynecology

## 2023-05-22 ENCOUNTER — Ambulatory Visit: Payer: MEDICAID | Attending: Obstetrics | Admitting: Obstetrics

## 2023-05-22 ENCOUNTER — Other Ambulatory Visit: Payer: Self-pay

## 2023-05-22 DIAGNOSIS — O09213 Supervision of pregnancy with history of pre-term labor, third trimester: Secondary | ICD-10-CM

## 2023-05-22 DIAGNOSIS — O09293 Supervision of pregnancy with other poor reproductive or obstetric history, third trimester: Secondary | ICD-10-CM | POA: Diagnosis not present

## 2023-05-22 DIAGNOSIS — O99810 Abnormal glucose complicating pregnancy: Secondary | ICD-10-CM

## 2023-05-22 DIAGNOSIS — Z3A35 35 weeks gestation of pregnancy: Secondary | ICD-10-CM

## 2023-05-22 DIAGNOSIS — E669 Obesity, unspecified: Secondary | ICD-10-CM | POA: Diagnosis not present

## 2023-05-22 DIAGNOSIS — O99213 Obesity complicating pregnancy, third trimester: Secondary | ICD-10-CM

## 2023-05-22 DIAGNOSIS — Z3A34 34 weeks gestation of pregnancy: Secondary | ICD-10-CM

## 2023-05-22 DIAGNOSIS — O368390 Maternal care for abnormalities of the fetal heart rate or rhythm, unspecified trimester, not applicable or unspecified: Secondary | ICD-10-CM

## 2023-05-22 DIAGNOSIS — O3433 Maternal care for cervical incompetence, third trimester: Secondary | ICD-10-CM

## 2023-05-22 DIAGNOSIS — O26893 Other specified pregnancy related conditions, third trimester: Secondary | ICD-10-CM | POA: Insufficient documentation

## 2023-05-22 DIAGNOSIS — O368330 Maternal care for abnormalities of the fetal heart rate or rhythm, third trimester, not applicable or unspecified: Secondary | ICD-10-CM | POA: Diagnosis not present

## 2023-05-22 HISTORY — DX: Gestational diabetes mellitus in pregnancy, unspecified control: O24.419

## 2023-05-22 HISTORY — DX: Urinary tract infection, site not specified: N39.0

## 2023-05-22 NOTE — MAU Provider Note (Signed)
Chief Complaint  Patient presents with   Fetal Tachycardia   Failed NST     Event Date/Time   First Provider Initiated Contact with Patient 05/22/23 1944      S: Briana Lynch  is a 27 y.o. y.o. year old G41P0101 female at [redacted]w[redacted]d weeks gestation who was sent to MAU from maternal-fetal medicine for tachycardia on BPP followed by nonreactive NST.  Contractions: Denies Vaginal bleeding: Denies Leaking of fluid: Denies  Patient Active Problem List   Diagnosis Date Noted   Abnormal GTT (glucose tolerance test) 04/17/2023   Alpha thalassemia silent carrier 01/23/2023   Cervical cerclage suture present in second trimester 01/14/2023   Obesity affecting pregnancy 12/20/2022   Supervision of high risk pregnancy in first trimester 12/18/2022   History of preterm delivery, currently pregnant 08/18/2021   Cervical insufficiency in pregnancy, antepartum, first trimester 08/17/2021   Anemia of pregnancy in third trimester 06/01/2021   Chronic post-traumatic stress disorder (PTSD) 01/04/2019   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    O: Patient Vitals for the past 24 hrs:  BP Temp Temp src Pulse Resp SpO2 Height Weight  05/22/23 1824 126/67 (!) 97.4 F (36.3 C) Axillary (!) 102 16 99 % -- --  05/22/23 1821 -- -- -- -- -- -- 5\' 8"  (1.727 m) 128.4 kg   General: NAD Heart: Regular rate Lungs: Normal rate and effort Abd: Soft, NT, Gravid, S=D Pelvic: NEFG, no blood.     NST performed EFM: 135, mod variability, 15x15 accels, no decels Toco: Irregular, painless  BPP 8/8.  MDM -Patient at 35.0 weeks with tachycardia noted on office BPP followed by nonreactive NST now has reactive NST and repeat BPP is 8 out of 8 for total of 10/10.  Fetal status is reassuring.  A: [redacted]w[redacted]d week IUP Fetal tachycardia not found.  Fetal heart rate tracing category 1.  P:Discharge home in stable condition. Return labor precautions and fetal kick counts. Follow-up as scheduled for prenatal  visit or sooner as needed if symptoms worsen. Return to maternity admissions as needed if symptoms worsen.  Katrinka Blazing, [place], CNM 05/22/2023 7:59 PM  2

## 2023-05-22 NOTE — Progress Notes (Signed)
MFM Note  Briana Lynch is currently at 34 weeks and 6 days.  She was seen for a BPP today due to maternal obesity with a BMI of 41.  She currently has a cerclage in place.  The patient reports that she feels fetal movements daily.  A biophysical profile performed today was 8/10 with a nonreactive NST.  The AFI was 19.49 cm (within normal limits).   During the performance of her biophysical profile, fetal tachycardia was noted with heart rate in the 170s to 180s range.  Fetal tachycardia was not noted during the 40 minutes while she was on the NST monitor.  The fetal tachycardia noted during her BPP may have been the result of vigorous fetal movements that were noted during her ultrasound exam.  Due to possible fetal tachycardia and a nonreactive NST, she was sent to the MAU for prolonged monitoring and a repeat BPP.    Should her NST be reactive and the BPP be 8 out of 8, she may be discharged home.    She is already scheduled for another BPP in our office in 1 week.  A total of 20 minutes was spent counseling and coordinating the care for this patient.  Greater than 50% of the time was spent in direct face-to-face contact.

## 2023-05-22 NOTE — MAU Note (Signed)
.  Briana Lynch is a 27 y.o. at [redacted]w[redacted]d here in MAU reporting: Sent over from the office for fetal tachycardia noted during NST. Fetal tachycardia resolved during NST but NST remained nonreactive. Denies VB or LOF. +FM.   Onset of complaint: Today Pain score: Denies pain.  FHT: 146 initial external Lab orders placed from triage: none

## 2023-05-22 NOTE — Procedures (Signed)
Briana Lynch [birth year] [redacted]w[redacted]d  Fetus A Non-Stress Test Interpretation for 05/22/23  Indication:  Obesity  Fetal Heart Rate A Mode: External Baseline Rate (A): 155 bpm Variability: Moderate Accelerations: 15 x 15, 10 x 10 Decelerations: None Multiple birth?: No  Uterine Activity Mode: Toco Contraction Frequency (min): Irregular Contraction Duration (sec): 40-90 Contraction Quality: Mild Resting Tone Palpated: Relaxed Resting Time: Adequate  Interpretation (Fetal Testing) Nonstress Test Interpretation: Non-reactive Comments: Tracing reviewed by Dr. Parke Poisson

## 2023-05-23 ENCOUNTER — Other Ambulatory Visit: Payer: MEDICAID

## 2023-05-27 ENCOUNTER — Other Ambulatory Visit: Payer: Self-pay

## 2023-05-27 DIAGNOSIS — O24419 Gestational diabetes mellitus in pregnancy, unspecified control: Secondary | ICD-10-CM

## 2023-05-27 HISTORY — DX: Gestational diabetes mellitus in pregnancy, unspecified control: O24.419

## 2023-05-28 ENCOUNTER — Other Ambulatory Visit: Payer: Self-pay

## 2023-05-28 ENCOUNTER — Encounter: Payer: MEDICAID | Admitting: Obstetrics and Gynecology

## 2023-05-28 ENCOUNTER — Other Ambulatory Visit (HOSPITAL_COMMUNITY)
Admission: RE | Admit: 2023-05-28 | Discharge: 2023-05-28 | Disposition: A | Discharge status: Home / Self Care | Payer: MEDICAID | Source: Ambulatory Visit | Attending: Family Medicine | Admitting: Family Medicine

## 2023-05-28 ENCOUNTER — Ambulatory Visit: Payer: MEDICAID | Admitting: Dietician

## 2023-05-28 ENCOUNTER — Encounter: Payer: MEDICAID | Attending: Family Medicine | Admitting: Dietician

## 2023-05-28 ENCOUNTER — Ambulatory Visit (INDEPENDENT_AMBULATORY_CARE_PROVIDER_SITE_OTHER): Payer: MEDICAID | Admitting: Obstetrics and Gynecology

## 2023-05-28 ENCOUNTER — Other Ambulatory Visit: Payer: Self-pay | Admitting: *Deleted

## 2023-05-28 VITALS — BP 96/65 | HR 71 | Wt 281.6 lb

## 2023-05-28 DIAGNOSIS — O24410 Gestational diabetes mellitus in pregnancy, diet controlled: Secondary | ICD-10-CM

## 2023-05-28 DIAGNOSIS — Z3A35 35 weeks gestation of pregnancy: Secondary | ICD-10-CM | POA: Insufficient documentation

## 2023-05-28 DIAGNOSIS — Z1331 Encounter for screening for depression: Secondary | ICD-10-CM | POA: Diagnosis not present

## 2023-05-28 DIAGNOSIS — O09899 Supervision of other high risk pregnancies, unspecified trimester: Secondary | ICD-10-CM

## 2023-05-28 DIAGNOSIS — O24419 Gestational diabetes mellitus in pregnancy, unspecified control: Secondary | ICD-10-CM | POA: Diagnosis present

## 2023-05-28 DIAGNOSIS — O0991 Supervision of high risk pregnancy, unspecified, first trimester: Secondary | ICD-10-CM

## 2023-05-28 DIAGNOSIS — O3432 Maternal care for cervical incompetence, second trimester: Secondary | ICD-10-CM

## 2023-05-28 DIAGNOSIS — O0993 Supervision of high risk pregnancy, unspecified, third trimester: Secondary | ICD-10-CM

## 2023-05-28 DIAGNOSIS — R7303 Prediabetes: Secondary | ICD-10-CM

## 2023-05-28 DIAGNOSIS — O99013 Anemia complicating pregnancy, third trimester: Secondary | ICD-10-CM

## 2023-05-28 DIAGNOSIS — B009 Herpesviral infection, unspecified: Secondary | ICD-10-CM

## 2023-05-28 NOTE — Addendum Note (Signed)
Addended byVidal Schwalbe on: 05/28/2023 03:02 PM   Modules accepted: Orders

## 2023-05-28 NOTE — Progress Notes (Signed)
Patient was seen for Gestational Diabetes self-management on 05/28/2023  Start time 1115 and End time 1200   Estimated due date: 06/26/2023; [redacted]w[redacted]d  Clinical: Medications: prenatal vitamin, iron infusions Medical History: GDM, prediabetes, urinary incontinence, PCOS Labs: OGTT 1 hour glucose 198, 2 hour glucose 178, 3 hour 74, fasting 112 05/14/2023, A1c 6.1% 12/18/2022  Dietary and Lifestyle History: Patient lives with her 26 yo child with her parents and 3 younger sisters.  He was a preemie. Sister's are on a pescetarian diet and gets nauseous with the smell of fish. Ate more plant based with small amounts of meat in college and lost 80 lbs.  Prediabetes went away at that time but has regained her weight and A1C is now in the prediabetes range.  She is a Lawyer.  Physical Activity: walks at work Stress: high - she wants to move Sleep: good  24 hr Recall:    First Meal: toaster strudel  Snack:  none Second meal:  chipolte  Snack:  pound cake "a lot" Third meal:  nauseous from the smell of fish Snack:  pound cake Beverages:  water, occasional regular soda, occasional juice  NUTRITION INTERVENTION  Nutrition education (E-1) on the following topics:   Initial Follow-up  [x]  []  Definition of Gestational Diabetes [x]  []  Why dietary management is important in controlling blood glucose [x]  []  Effects each nutrient has on blood glucose levels [x]  []  Simple carbohydrates vs complex carbohydrates [x]  []  Fluid intake [x]  []  Creating a balanced meal plan []  []  Carbohydrate counting  [x]  []  When to check blood glucose levels [x]  []  Proper blood glucose monitoring techniques [x]  []  Effect of stress and stress reduction techniques  [x]  []  Exercise effect on blood glucose levels, appropriate exercise during pregnancy [x]  []  Importance of limiting caffeine and abstaining from alcohol and smoking [x]  []  Medications used for blood sugar control during  pregnancy [x]  []  Hypoglycemia and rule of 15 [x]  []  Postpartum self care  Blood glucose monitor given: Accu Chek Guide me Lot # [id number] Exp: 03/20/2024 CBG: 75 mg/dL 3 hours after breakfast  Patient instructed to monitor glucose levels: FBS: 60 - <= 95 mg/dL; 2 hour: <= 433 mg/dL  Patient received handouts: Nutrition Diabetes and Pregnancy Carbohydrate Counting List Blood glucose log Snack ideas for diabetes during pregnancy  Patient will be seen for follow-up as needed.

## 2023-05-28 NOTE — Progress Notes (Signed)
Subjective:  Briana Lynch is a 27 y.o. G2P0101 at [redacted]w[redacted]d being seen today for ongoing prenatal care.  She is currently monitored for the following issues for this high-risk pregnancy and has HSV-2 (herpes simplex virus 2) infection; Chronic post-traumatic stress disorder (PTSD); Anemia of pregnancy in third trimester; Cervical insufficiency in pregnancy, antepartum, first trimester; History of preterm delivery, currently pregnant; Supervision of high risk pregnancy in first trimester; Obesity affecting pregnancy; Cervical cerclage suture present in second trimester; Alpha thalassemia silent carrier; Abnormal GTT (glucose tolerance test); and GDM (gestational diabetes mellitus) on their problem list.  Patient reports no complaints.  Contractions: Irritability. Vag. Bleeding: None.  Movement: Present. Denies leaking of fluid.   The following portions of the patient's history were reviewed and updated as appropriate: allergies, current medications, past family history, past medical history, past social history, past surgical history and problem list. Problem list updated.  Objective:   Vitals:   05/28/23 1332  BP: 96/65  Pulse: 71  Weight: 281 lb 9.6 oz (127.7 kg)    Fetal Status: Fetal Heart Rate (bpm): 135   Movement: Present     General:  Alert, oriented and cooperative. Patient is in no acute distress.  Skin: Skin is warm and dry. No rash noted.   Cardiovascular: Normal heart rate noted  Respiratory: Normal respiratory effort, no problems with respiration noted  Abdomen: Soft, gravid, appropriate for gestational age. Pain/Pressure: Absent     Pelvic: Vag. Bleeding: None     Cervical exam deferred        Extremities: Normal range of motion.     Mental Status: Normal mood and affect. Normal behavior. Normal judgment and thought content.   Urinalysis:      Assessment and Plan:  Pregnancy: G2P0101 at 105w6d  1. Supervision of high risk pregnancy in first trimester BP and FHR  normal Doing well, feeling regular movement    2. Cervical cerclage suture present in second trimester 3. History of preterm delivery, currently pregnant Discuss clipping cerclage next visit  Labor precautions discussed  BPP tomorrow   4. Anemia of pregnancy in third trimester Iv infusion second dose tomorrow   5. HSV-2 (herpes simplex virus 2) infection Start valtrex, rx sent previously   6. [redacted] weeks gestation of pregnancy Swabs collected today, patient does not have allergy to penicillin, her sister had a bad reaction when she was younger and reports her mom never gave it to her siblings. Unsure if she has had it.  Talked about RSV today, declined today  - GC/Chlamydia probe amp ([place])not at [facility] - Culture, Grp B Strep w/Rflx Suscept  7. Diet controlled gestational diabetes mellitus (GDM) in third trimester Met with educator today and picked up supplies today. Instructed to start checking  Sugars 4 x day and bring log to next appt.  Discussed dietary changes, meals with snacks in between, encourage protein   Preterm labor symptoms and general obstetric precautions including but not limited to vaginal bleeding, contractions, leaking of fluid and fetal movement were reviewed in detail with the patient. Please refer to After Visit Summary for other counseling recommendations.  Future Appointments  Date Time Provider Department Center  05/29/2023  9:15 AM [facility] US2 [facility]  05/29/2023  1:00 PM [facility]-CHAIR 6 [facility] None  06/04/2023  2:35 PM Sue Lush, FNP [facility]  06/05/2023  2:15 PM [facility] NURSE [facility]  06/05/2023  2:30 PM [facility] US5 [facility]  06/10/2023 10:15 AM CHINF-CHAIR 6 [facility]IN[facility] None  06/11/2023  2:35 PM Venora Maples, MD [facility]  06/18/2023  1:15 PM Venora Maples, MD [facility]  06/25/2023  1:15 PM Sue Lush, FNP [facility]     Sue Lush, FNP

## 2023-05-29 ENCOUNTER — Ambulatory Visit: Payer: MEDICAID

## 2023-05-29 ENCOUNTER — Other Ambulatory Visit: Payer: MEDICAID

## 2023-05-29 ENCOUNTER — Ambulatory Visit (INDEPENDENT_AMBULATORY_CARE_PROVIDER_SITE_OTHER): Payer: MEDICAID

## 2023-05-29 VITALS — BP 99/68 | HR 89 | Temp 98.1°F | Resp 18 | Ht 68.0 in | Wt 278.0 lb

## 2023-05-29 DIAGNOSIS — Z3A36 36 weeks gestation of pregnancy: Secondary | ICD-10-CM

## 2023-05-29 DIAGNOSIS — D508 Other iron deficiency anemias: Secondary | ICD-10-CM

## 2023-05-29 DIAGNOSIS — O0993 Supervision of high risk pregnancy, unspecified, third trimester: Secondary | ICD-10-CM

## 2023-05-29 DIAGNOSIS — O99013 Anemia complicating pregnancy, third trimester: Secondary | ICD-10-CM

## 2023-05-29 DIAGNOSIS — O24419 Gestational diabetes mellitus in pregnancy, unspecified control: Secondary | ICD-10-CM | POA: Diagnosis not present

## 2023-05-29 DIAGNOSIS — O0991 Supervision of high risk pregnancy, unspecified, first trimester: Secondary | ICD-10-CM

## 2023-05-29 LAB — GC/CHLAMYDIA PROBE AMP (~~LOC~~) NOT AT ARMC
Chlamydia: NEGATIVE
Comment: NEGATIVE
Comment: NORMAL
Neisseria Gonorrhea: NEGATIVE

## 2023-05-29 MED ORDER — IRON SUCROSE 300 MG IVPB - SIMPLE MED
300.0000 mg | Status: DC
  Administered 2023-05-29: 300 mg via INTRAVENOUS
  Filled 2023-05-29: qty 265

## 2023-05-29 MED ORDER — ACETAMINOPHEN 325 MG PO TABS
650.0000 mg | ORAL_TABLET | Freq: Once | ORAL | Status: AC
  Administered 2023-05-29: 650 mg via ORAL
  Filled 2023-05-29: qty 2

## 2023-05-29 MED ORDER — DIPHENHYDRAMINE HCL 25 MG PO CAPS
25.0000 mg | ORAL_CAPSULE | Freq: Once | ORAL | Status: AC
  Administered 2023-05-29: 25 mg via ORAL
  Filled 2023-05-29: qty 1

## 2023-05-29 NOTE — Progress Notes (Signed)
Diagnosis: Iron Deficiency Anemia  Provider:  Chilton Greathouse MD  Procedure: IV Infusion  IV Type: Peripheral, IV Location: L Antecubital  Venofer (Iron Sucrose), Dose: 300 mg  Infusion Start Time: 1351  Infusion Stop Time: 1543  Post Infusion IV Care: Patient declined observation and Peripheral IV Discontinued  Discharge: Condition: Good, Destination: Home . AVS Declined  Performed by:  Adriana Mccallum, RN

## 2023-05-31 ENCOUNTER — Inpatient Hospital Stay (HOSPITAL_COMMUNITY)
Admission: AD | Admit: 2023-05-31 | Discharge: 2023-05-31 | Disposition: A | Discharge status: Home / Self Care | Payer: MEDICAID | Attending: Family Medicine | Admitting: Family Medicine

## 2023-05-31 ENCOUNTER — Encounter (HOSPITAL_COMMUNITY): Payer: Self-pay | Admitting: Obstetrics and Gynecology

## 2023-05-31 ENCOUNTER — Other Ambulatory Visit: Payer: Self-pay

## 2023-05-31 DIAGNOSIS — Z3A36 36 weeks gestation of pregnancy: Secondary | ICD-10-CM | POA: Insufficient documentation

## 2023-05-31 DIAGNOSIS — O3433 Maternal care for cervical incompetence, third trimester: Secondary | ICD-10-CM | POA: Insufficient documentation

## 2023-05-31 DIAGNOSIS — Z9889 Other specified postprocedural states: Secondary | ICD-10-CM

## 2023-05-31 DIAGNOSIS — O479 False labor, unspecified: Secondary | ICD-10-CM

## 2023-05-31 DIAGNOSIS — D649 Anemia, unspecified: Secondary | ICD-10-CM | POA: Insufficient documentation

## 2023-05-31 DIAGNOSIS — O99333 Smoking (tobacco) complicating pregnancy, third trimester: Secondary | ICD-10-CM | POA: Insufficient documentation

## 2023-05-31 DIAGNOSIS — O24419 Gestational diabetes mellitus in pregnancy, unspecified control: Secondary | ICD-10-CM | POA: Insufficient documentation

## 2023-05-31 DIAGNOSIS — O99013 Anemia complicating pregnancy, third trimester: Secondary | ICD-10-CM | POA: Insufficient documentation

## 2023-05-31 DIAGNOSIS — F17210 Nicotine dependence, cigarettes, uncomplicated: Secondary | ICD-10-CM | POA: Diagnosis not present

## 2023-05-31 DIAGNOSIS — O09299 Supervision of pregnancy with other poor reproductive or obstetric history, unspecified trimester: Secondary | ICD-10-CM

## 2023-05-31 DIAGNOSIS — O4703 False labor before 37 completed weeks of gestation, third trimester: Secondary | ICD-10-CM | POA: Insufficient documentation

## 2023-05-31 DIAGNOSIS — O09293 Supervision of pregnancy with other poor reproductive or obstetric history, third trimester: Secondary | ICD-10-CM | POA: Diagnosis not present

## 2023-05-31 NOTE — MAU Note (Signed)
Briana Lynch is a 27 y.o. at 104w2d here in MAU reporting: she's been having ctxs since last night that have continued today.  Reports ctxs have gotten stronger and hurt.  Also reports increased pelvic pressure.  Denies VB or LOF.  Endorses +FM, less than usual. LMP: NA Onset of complaint: last night Pain score: 5 Vitals:   05/31/23 1835  BP: 131/76  Pulse: (!) 107  Resp: 19  Temp: 98 F (36.7 C)  SpO2: 99%     FHT:154 bpm Lab orders placed from triage:   None

## 2023-05-31 NOTE — MAU Provider Note (Cosign Needed Addendum)
MAU Provider Note  Chief Complaint: Contractions   Event Date/Time   First Provider Initiated Contact with Patient 05/31/23 1859      SUBJECTIVE HPI: Briana Lynch is a 27 y.o. G2P0101 at [redacted]w[redacted]d by early ultrasound who presents to maternity admissions reporting regular, painful ctx. Pregnancy c/b cerclage in place, GDM, anemia, hx HSV. Receives [facility] with [facility].  Patient started to have uncomfortable contractions earlier today. Started to have more regular contractions this evening ~6 minutes apart. Feels babe has descended down into the pelvis. Denies VB, LOF, urinary symptoms. +FM.   HPI  Past Medical History:  Diagnosis Date   Anemia in pregnancy, second trimester 06/01/2021   Mild anemia -- baseline HGB 10.6     Ankle sprain and strain 11/15/2010   Anxiety state 12/09/2012   Caustic injury gastritis    Depression    Gestational diabetes    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   Diagnosed at an outside hospital with swabs in September 2016, second lesion May 24th 2017   Iron deficiency anemia 10/19/2014   PCOS (polycystic ovarian syndrome)    Prediabetes 08/20/2014   HA1C 6.0% at beginning of 2022 pregnancy     Suicide attempt (HCC) 10/23/2020   Urinary incontinence 12/11/2012   UTI (urinary tract infection)    Vitamin D deficiency 08/20/2014   Past Surgical History:  Procedure Laterality Date   CERVICAL CERCLAGE N/A 12/24/2022   Procedure: CERCLAGE CERVICAL;  Surgeon: Catalina Antigua, MD;  Location: [facility] LD ORS;  Service: Gynecology;  Laterality: N/A;   ESOPHAGOGASTRODUODENOSCOPY (EGD) WITH PROPOFOL N/A 10/24/2020   Procedure: ESOPHAGOGASTRODUODENOSCOPY (EGD) WITH PROPOFOL;  Surgeon: Beverley Fiedler, MD;  Location: [facility] ENDOSCOPY;  Service: Gastroenterology;  Laterality: N/A;   Social History   Socioeconomic History   Marital status: Single    Spouse name: Not on file   Number of children: Not on file   Years of education: Not on file   Highest education  level: 12th grade  Occupational History   Occupation: Administrator, 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    Comment: Works from home  Tobacco Use   Smoking status: Every Day    Current packs/day: 0.50    Average packs/day: 0.5 packs/day for 8.9 years (4.4 ttl pk-yrs)    Types: Cigarettes    Start date: 2016   Smokeless tobacco: Never   Tobacco comments:    Dad smokes  Vaping Use   Vaping status: Never Used  Substance and Sexual Activity   Alcohol use: Not Currently    Alcohol/week: 0.0 standard drinks of alcohol   Drug use: Not Currently    Types: Marijuana    Comment: ?sometime this summer 2024   Sexual activity: Not Currently    Birth control/protection: None  Other Topics Concern   Not on file  Social History Narrative   2014:Sophomore in high school, taking AP classes, enjoys school manages basketball, football and track teams   Social Determinants of Health   Financial Resource Strain: Not on file  Food Insecurity: Unknown (01/04/2019)   Hunger Vital Sign    Worried About Running Out of Food in the Last Year: Patient declined    Ran Out of Food in the Last Year: Patient declined  Transportation Needs: Unknown (01/04/2019)   PRAPARE - Transportation    Lack of Transportation (Medical): Patient declined    Lack of Transportation (Non-Medical): Patient declined  Physical Activity: Unknown (01/04/2019)   Exercise Vital Sign    Days of Exercise per Week: Patient  declined    Minutes of Exercise per Session: Patient declined  Stress: Not on file  Social Connections: Unknown (01/04/2019)   Social Connection and Isolation Panel [NHANES]    Frequency of Communication with Friends and Family: Patient declined    Frequency of Social Gatherings with Friends and Family: Patient declined    Attends Religious Services: Patient declined    Database administrator or Organizations: Patient declined    Attends Banker Meetings: Patient declined    Marital Status: Patient declined  Intimate Partner  Violence: Unknown (01/04/2019)   Humiliation, Afraid, Rape, and Kick questionnaire    Fear of Current or Ex-Partner: Patient declined    Emotionally Abused: Patient declined    Physically Abused: Patient declined    Sexually Abused: Patient declined   No current facility-administered medications on file prior to encounter.   Current Outpatient Medications on File Prior to Encounter  Medication Sig Dispense Refill   ferrous sulfate 325 (65 FE) MG EC tablet Take 1 tablet (325 mg total) by mouth every other day. 30 tablet 2   Prenatal Vit-Fe Fumarate-FA (PRENATAL MULTIVITAMIN) TABS tablet Take 1 tablet by mouth daily.     valACYclovir (VALTREX) 500 MG tablet Take 1 tablet (500 mg total) by mouth 2 (two) times daily. 60 tablet 2   Accu-Chek Softclix Lancets lancets Use four times daily as instructed. 100 each 12   Blood Glucose Monitoring Suppl (ACCU-CHEK GUIDE) w/Device KIT 1 Device by Does not apply route in the morning, at noon, in the evening, and at bedtime. 1 kit 0   glucose blood test strip Use as instructed 100 each 12   Allergies  Allergen Reactions   Penicillins Other (See Comments)    Did it involve swelling of the face/tongue/throat, SOB, or low BP? No Did it involve sudden or severe rash/hives, skin peeling, or any reaction on the inside of your mouth or nose? No Did you need to seek medical attention at a hospital or doctor's office? No When did it last happen? Never      If all above answers are "NO", may proceed with cephalosporin use.  Unknown-pt's sister is allergic, pt NEVER had it    ROS:  Pertinent positives/negatives listed above.  I have reviewed patient's Past Medical Hx, Surgical Hx, Family Hx, Social Hx, medications and allergies.   Physical Exam  Patient Vitals for the past 24 hrs:  BP Temp Temp src Pulse Resp SpO2 Height Weight  05/31/23 2210 128/83 -- -- 90 -- 100 % -- --  05/31/23 1835 131/76 98 F (36.7 C) Oral (!) 107 19 99 % -- --  05/31/23 1831 --  -- -- -- -- -- 5\' 8"  (1.727 m) 130.9 kg   Constitutional: Well-developed, well-nourished female in no acute distress  Cardiovascular: normal rate Respiratory: normal effort GI: Abd soft, non-tender MS: Extremities nontender, no edema, normal ROM Neurologic: Alert and oriented x 4  GU: Neg CVAT.  PELVIC EXAM: Cervix pink, visually open, without lesion, white creamy discharge, vaginal walls and external genitalia normal. Cerclage in place  FHT:  Baseline 135, moderate variability, accelerations present, no decelerations Contractions: q 6 mins  LAB RESULTS No results found for this or any previous visit (from the past 24 hour(s)).  --/--/O POS (06/10 0948)  IMAGING   MAU Management/MDM: Orders Placed This Encounter  Procedures   Discharge patient    No orders of the defined types were placed in this encounter.    Available prenatal records  reviewed.  Patient presents with contractions that are regular and painful. Has cerclage in place. Discussed case with Dr. Shawnie Pons who removed cerclage at the bedside. Was checked ~2 weeks ago in the office with cerclage in place and was 2/thick. Will now treat as a labor check. Will need cervical exam in ~1 hour to assess if progressing in dilation.  Reassuring fetal status at this time with reactive tracing.  Patient signed out to Judeth Horn, NP for further care at end of my shift.  ASSESSMENT 1. False labor   2. Hx of cerclage, currently pregnant   3. [redacted] weeks gestation of pregnancy     PLAN Pending.  Wylene Simmer, MD OB Fellow 05/31/2023  7:51 PM    Cervix 2.5/60/ballotable. Cervix unchanged after 1 hour of monitoring. Infrequent contractions on monitor.    1. False labor   2. Hx of cerclage, currently pregnant   3. [redacted] weeks gestation of pregnancy    -Reviewed labor precautions with patient & reasons to return to MAU  Judeth Horn, NP  05/31/2023 10:17 PM

## 2023-06-02 ENCOUNTER — Encounter (HOSPITAL_COMMUNITY): Payer: Self-pay | Admitting: Family Medicine

## 2023-06-02 ENCOUNTER — Encounter: Payer: Self-pay | Admitting: Family Medicine

## 2023-06-02 ENCOUNTER — Inpatient Hospital Stay (HOSPITAL_COMMUNITY)
Admission: AD | Admit: 2023-06-02 | Discharge: 2023-06-04 | DRG: 776 | Disposition: A | Discharge status: Home / Self Care | Payer: MEDICAID | Attending: Family Medicine | Admitting: Family Medicine

## 2023-06-02 DIAGNOSIS — Z818 Family history of other mental and behavioral disorders: Secondary | ICD-10-CM | POA: Diagnosis not present

## 2023-06-02 DIAGNOSIS — Z148 Genetic carrier of other disease: Secondary | ICD-10-CM

## 2023-06-02 DIAGNOSIS — F17210 Nicotine dependence, cigarettes, uncomplicated: Secondary | ICD-10-CM | POA: Diagnosis present

## 2023-06-02 DIAGNOSIS — O9081 Anemia of the puerperium: Secondary | ICD-10-CM | POA: Diagnosis present

## 2023-06-02 DIAGNOSIS — O24430 Gestational diabetes mellitus in the puerperium, diet controlled: Secondary | ICD-10-CM | POA: Diagnosis present

## 2023-06-02 DIAGNOSIS — O99345 Other mental disorders complicating the puerperium: Secondary | ICD-10-CM | POA: Diagnosis present

## 2023-06-02 DIAGNOSIS — O9833 Other infections with a predominantly sexual mode of transmission complicating the puerperium: Secondary | ICD-10-CM | POA: Diagnosis present

## 2023-06-02 DIAGNOSIS — O99013 Anemia complicating pregnancy, third trimester: Secondary | ICD-10-CM | POA: Diagnosis present

## 2023-06-02 DIAGNOSIS — B009 Herpesviral infection, unspecified: Secondary | ICD-10-CM | POA: Diagnosis present

## 2023-06-02 DIAGNOSIS — Z880 Allergy status to penicillin: Secondary | ICD-10-CM | POA: Diagnosis not present

## 2023-06-02 DIAGNOSIS — Z79899 Other long term (current) drug therapy: Secondary | ICD-10-CM

## 2023-06-02 DIAGNOSIS — Z3A36 36 weeks gestation of pregnancy: Secondary | ICD-10-CM | POA: Diagnosis not present

## 2023-06-02 DIAGNOSIS — Z8249 Family history of ischemic heart disease and other diseases of the circulatory system: Secondary | ICD-10-CM

## 2023-06-02 DIAGNOSIS — F419 Anxiety disorder, unspecified: Secondary | ICD-10-CM | POA: Diagnosis present

## 2023-06-02 DIAGNOSIS — Z833 Family history of diabetes mellitus: Secondary | ICD-10-CM | POA: Diagnosis not present

## 2023-06-02 DIAGNOSIS — O24419 Gestational diabetes mellitus in pregnancy, unspecified control: Secondary | ICD-10-CM | POA: Diagnosis present

## 2023-06-02 DIAGNOSIS — D563 Thalassemia minor: Secondary | ICD-10-CM | POA: Diagnosis present

## 2023-06-02 DIAGNOSIS — O24420 Gestational diabetes mellitus in childbirth, diet controlled: Secondary | ICD-10-CM | POA: Diagnosis not present

## 2023-06-02 DIAGNOSIS — O99335 Smoking (tobacco) complicating the puerperium: Secondary | ICD-10-CM | POA: Diagnosis present

## 2023-06-02 DIAGNOSIS — A6000 Herpesviral infection of urogenital system, unspecified: Secondary | ICD-10-CM | POA: Diagnosis present

## 2023-06-02 DIAGNOSIS — O9832 Other infections with a predominantly sexual mode of transmission complicating childbirth: Secondary | ICD-10-CM | POA: Diagnosis not present

## 2023-06-02 LAB — CBC WITH DIFFERENTIAL/PLATELET
Abs Immature Granulocytes: 0.11 10*3/uL — ABNORMAL HIGH (ref 0.00–0.07)
Basophils Absolute: 0 10*3/uL (ref 0.0–0.1)
Basophils Relative: 0 %
Eosinophils Absolute: 0 10*3/uL (ref 0.0–0.5)
Eosinophils Relative: 0 %
HCT: 28.1 % — ABNORMAL LOW (ref 36.0–46.0)
Hemoglobin: 9 g/dL — ABNORMAL LOW (ref 12.0–15.0)
Immature Granulocytes: 1 %
Lymphocytes Relative: 13 %
Lymphs Abs: 2 10*3/uL (ref 0.7–4.0)
MCH: 27.3 pg (ref 26.0–34.0)
MCHC: 32 g/dL (ref 30.0–36.0)
MCV: 85.2 fL (ref 80.0–100.0)
Monocytes Absolute: 0.7 10*3/uL (ref 0.1–1.0)
Monocytes Relative: 5 %
Neutro Abs: 12.4 10*3/uL — ABNORMAL HIGH (ref 1.7–7.7)
Neutrophils Relative %: 81 %
Platelets: 384 10*3/uL (ref 150–400)
RBC: 3.3 MIL/uL — ABNORMAL LOW (ref 3.87–5.11)
RDW: 16.9 % — ABNORMAL HIGH (ref 11.5–15.5)
WBC: 15.2 10*3/uL — ABNORMAL HIGH (ref 4.0–10.5)
nRBC: 0 % (ref 0.0–0.2)

## 2023-06-02 LAB — STREP GP B CULTURE+RFLX: Strep Gp B Culture+Rflx: POSITIVE — AB

## 2023-06-02 LAB — RPR: RPR Ser Ql: NONREACTIVE

## 2023-06-02 LAB — STREP GP B SUSCEPTIBILITY

## 2023-06-02 MED ORDER — DIPHENHYDRAMINE HCL 25 MG PO CAPS: 25.0000 mg | ORAL_CAPSULE | Freq: Four times a day (QID) | ORAL | Status: DC | PRN

## 2023-06-02 MED ORDER — SIMETHICONE 80 MG PO CHEW: 80.0000 mg | CHEWABLE_TABLET | ORAL | Status: DC | PRN

## 2023-06-02 MED ORDER — SODIUM CHLORIDE 0.9% FLUSH
10.0000 mL | Freq: Two times a day (BID) | INTRAVENOUS | Status: DC
Start: 2023-06-02 — End: 2023-06-04

## 2023-06-02 MED ORDER — OXYTOCIN 10 UNIT/ML IJ SOLN
INTRAMUSCULAR | Status: AC
  Administered 2023-06-02: 10 [IU]
  Filled 2023-06-02: qty 1

## 2023-06-02 MED ORDER — SENNOSIDES-DOCUSATE SODIUM 8.6-50 MG PO TABS
2.0000 | ORAL_TABLET | ORAL | Status: DC
Start: 2023-06-02 — End: 2023-06-04
  Administered 2023-06-02 – 2023-06-04 (×3): 2 via ORAL
  Filled 2023-06-02 (×3): qty 2

## 2023-06-02 MED ORDER — SODIUM CHLORIDE 0.9% FLUSH
3.0000 mL | Freq: Two times a day (BID) | INTRAVENOUS | Status: DC
Start: 2023-06-02 — End: 2023-06-04

## 2023-06-02 MED ORDER — KETOROLAC TROMETHAMINE 30 MG/ML IJ SOLN
30.0000 mg | Freq: Four times a day (QID) | INTRAMUSCULAR | Status: AC
  Administered 2023-06-03 (×4): 30 mg via INTRAMUSCULAR
  Filled 2023-06-02 (×4): qty 1

## 2023-06-02 MED ORDER — ACETAMINOPHEN 325 MG PO TABS
650.0000 mg | ORAL_TABLET | ORAL | Status: DC | PRN
  Administered 2023-06-02 (×2): 650 mg via ORAL
  Filled 2023-06-02 (×2): qty 2

## 2023-06-02 MED ORDER — WITCH HAZEL-GLYCERIN EX PADS: 1.0000 | MEDICATED_PAD | CUTANEOUS | Status: DC | PRN

## 2023-06-02 MED ORDER — DIBUCAINE (PERIANAL) 1 % EX OINT: 1.0000 | TOPICAL_OINTMENT | CUTANEOUS | Status: DC | PRN

## 2023-06-02 MED ORDER — PRENATAL MULTIVITAMIN CH
1.0000 | ORAL_TABLET | Freq: Every day | ORAL | Status: DC
Start: 2023-06-02 — End: 2023-06-04
  Administered 2023-06-02 – 2023-06-04 (×3): 1 via ORAL
  Filled 2023-06-02 (×3): qty 1

## 2023-06-02 MED ORDER — SODIUM CHLORIDE 0.9% FLUSH: 3.0000 mL | INTRAVENOUS | Status: DC | PRN

## 2023-06-02 MED ORDER — COCONUT OIL OIL
1.0000 | TOPICAL_OIL | Status: DC | PRN
  Administered 2023-06-02: 1 via TOPICAL

## 2023-06-02 MED ORDER — ONDANSETRON HCL 4 MG/2ML IJ SOLN: 4.0000 mg | INTRAMUSCULAR | Status: DC | PRN

## 2023-06-02 MED ORDER — BENZOCAINE-MENTHOL 20-0.5 % EX AERO: 1.0000 | INHALATION_SPRAY | CUTANEOUS | Status: DC | PRN

## 2023-06-02 MED ORDER — ONDANSETRON HCL 4 MG PO TABS: 4.0000 mg | ORAL_TABLET | ORAL | Status: DC | PRN

## 2023-06-02 MED ORDER — OXYCODONE HCL 5 MG PO TABS
5.0000 mg | ORAL_TABLET | ORAL | Status: AC
  Administered 2023-06-02: 5 mg via ORAL
  Filled 2023-06-02: qty 1

## 2023-06-02 MED ORDER — IBUPROFEN 800 MG PO TABS
800.0000 mg | ORAL_TABLET | Freq: Three times a day (TID) | ORAL | Status: DC
  Administered 2023-06-02 (×2): 800 mg via ORAL
  Filled 2023-06-02 (×3): qty 1

## 2023-06-02 MED ORDER — OXYTOCIN 10 UNIT/ML IJ SOLN: 10.0000 [IU] | Freq: Once | INTRAMUSCULAR | Status: AC

## 2023-06-02 MED ORDER — IBUPROFEN 600 MG PO TABS
600.0000 mg | ORAL_TABLET | Freq: Four times a day (QID) | ORAL | Status: DC
  Administered 2023-06-04 (×3): 600 mg via ORAL
  Filled 2023-06-02 (×3): qty 1

## 2023-06-02 NOTE — Progress Notes (Signed)
Initial PPH screening score unknown d/t vaginal delivery at home and no prior CBC to determine HGB level.

## 2023-06-02 NOTE — Lactation Note (Signed)
This note was copied from a baby's chart. Lactation Consultation Note  Patient Name: Briana Lynch [id number] Date: 06/02/2023 Age:26 hours Reason for consult: Initial assessment;Late-preterm 34-36.6wks;Other (Comment);Maternal endocrine disorder (Former NICU, "Briana Lynch's mama")  Visited with family of 10 hours old LPI female; [name] is a P2 and reports she's been latching baby "Briana Lynch" to her breast since she was born, she had a precipitous delivery at home. Her plan is to primarily breastfeed but she's amenable to use formula in the meantime until her milk comes in. She has a Hx of preterm delivery with former NICU baby "Briana Lynch" born at 40 1/7 weeks. Baby Briana Lynch passed both of her glucose screening, couplet engaged in STS care after latch attempt (see LATCH score). This LC and LC student Destiny set up a DEBP, provided a pumping top in size XL, she started pumping during [facility] consult, praised her for her efforts. Reviewed pumping schedule, pump settings, feeding cues, size of baby's stomach, LPI policy, supplementation and anticipatory guidelines.  Maternal Data Has patient been taught Hand Expression?: Yes Does the patient have breastfeeding experience prior to this delivery?: Yes How long did the patient breastfeed?: 7 months  Feeding Mother's Current Feeding Choice: Breast Milk and Formula  LATCH Score Latch: Too sleepy or reluctant, no latch achieved, no sucking elicited.  Audible Swallowing: None  Type of Nipple: Everted at rest and after stimulation  Comfort (Breast/Nipple): Soft / non-tender  Hold (Positioning): Assistance needed to correctly position infant at breast and maintain latch.  LATCH Score: 5  Lactation Tools Discussed/Used Tools: Pump;Flanges;Coconut oil;Hands-free pumping top Flange Size: 24 Breast pump type: Double-Electric Breast Pump Pump Education: Setup, frequency, and cleaning;Milk Storage Reason for Pumping: LPI Pumping frequency: initiated pumping at  8 hours post-partum  Interventions Interventions: Breast feeding basics reviewed;DEBP;Education;LC Services brochure  Plan Encouraged to continue putting baby to breast on feeding cues +8 times/24 hours or sooner if feeding cues are present She'll pump every 3 hours after feedings/attempts at the breast, ideally 8 pumping sessions/24 hours Breast massage, hand expression and coconut oil were also encouraged prior pumping Family will start supplementing with Similac 22 calorie formula, starting at 14 ml on day 1 Verify Stork pump issuance  MGM, big brother and visitors present. All questions and concerns answered, family to contact [facility] services PRN.  Discharge Pump:  (Send a Stork pump referral per patient's request)  Consult Status Consult Status: Follow-up Date: 06/03/23 Follow-up type: In-patient   Abdulaziz Toman Venetia Constable 06/02/2023, 4:17 PM

## 2023-06-02 NOTE — Discharge Summary (Shared)
Postpartum Discharge Summary  Date of Service updated***     Patient Name: Briana Lynch DOB: 01-Jul-1996 MRN: [id number]  Date of admission: 06/02/2023 Delivery date:06/02/2023 Delivering provider:   Date of discharge: 06/02/2023  Admitting diagnosis: home birth Intrauterine pregnancy: [redacted]w[redacted]d     Secondary diagnosis:  Principal Problem:   SVD (spontaneous vaginal delivery)  Additional problems: ***    Discharge diagnosis: Preterm Pregnancy Delivered and GDM A1                                              Post partum procedures:{Postpartum procedures:23558} Augmentation: N/A Complications: {OB Labor/Delivery Complications:20784}  Hospital course: Patient presented to [facility] via EMS after delivery of infant at home. She reportedly noted BBOW around 0100 on 11/17 and pushed once with subsequent delivery of infant. Placenta delivered en route to hospital. No lacerations noted.  Magnesium Sulfate received: {Mag received:30440022} BMZ received: No Rhophylac:N/A MMR:No T-DaP:Given prenatally Flu: No RSV Vaccine received: No Transfusion:{Transfusion received:30440034}  Immunizations received: Immunization History  Administered Date(s) Administered   DTaP 02/22/1997, 04/28/1997, 07/20/1997, 06/29/1998   HIB (PRP-OMP) 02/22/1997, 04/28/1997, 07/20/1997, 06/29/1998   HPV 9-valent 05/18/2014, 11/19/2014   HPV Quadrivalent 10/01/2013   Hepatitis A, Ped/Adol-2 Dose 05/18/2014, 11/19/2014   Hepatitis B 06-Jan-1997, 01/20/1997, 07/20/1997   IPV 02/22/1997, 04/28/1997, 07/20/1997, 11/19/2014   MMR 06/29/1998, 11/19/2014   Meningococcal Conjugate 10/01/2013   Td 03/08/2008   Tdap 03/08/2008, 04/01/2023   Varicella 06/29/1998, 11/19/2014    Physical exam  Vitals:   06/02/23 0645  BP: (!) 140/84  Pulse: 98   General: {Exam; general:21111117} Lochia: {Desc; appropriate/inappropriate:30686::"appropriate"} Uterine Fundus: {Desc; firm/soft:30687} Incision: {Exam;  incision:21111123} DVT Evaluation: {Exam; DGU:4403474} Labs: Lab Results  Component Value Date   WBC 10.6 04/16/2023   HGB 8.4 (L) 04/16/2023   HCT 26.4 (L) 04/16/2023   MCV 86 04/16/2023   PLT 388 04/16/2023      Latest Ref Rng & Units 10/25/2020    3:31 AM  CMP  Glucose 70 - 99 mg/dL 90   BUN 6 - 20 mg/dL <5   Creatinine 2.59 - 1.00 mg/dL 5.63   Sodium 875 - 643 mmol/L 137   Potassium 3.5 - 5.1 mmol/L 3.8   Chloride 98 - 111 mmol/L 108   CO2 22 - 32 mmol/L 25   Calcium 8.9 - 10.3 mg/dL 8.5    Edinburgh Score:    09/19/2021    8:28 AM  Edinburgh Postnatal Depression Scale Screening Tool  I have been able to laugh and see the funny side of things. 0  I have looked forward with enjoyment to things. 0  I have blamed myself unnecessarily when things went wrong. 0  I have been anxious or worried for no good reason. 0  I have felt scared or panicky for no good reason. 0  Things have been getting on top of me. 0  I have been so unhappy that I have had difficulty sleeping. 0  I have felt sad or miserable. 0  I have been so unhappy that I have been crying. 0  The thought of harming myself has occurred to me. 0  Edinburgh Postnatal Depression Scale Total 0   No data recorded  After visit meds:  Allergies as of 06/02/2023       Reactions   Penicillins Other (See Comments)   Did it  involve swelling of the face/tongue/throat, SOB, or low BP? No Did it involve sudden or severe rash/hives, skin peeling, or any reaction on the inside of your mouth or nose? No Did you need to seek medical attention at a hospital or doctor's office? No When did it last happen? Never      If all above answers are "NO", may proceed with cephalosporin use. Unknown-pt's sister is allergic, pt NEVER had it     Med Rec must be completed prior to using this [facility]***        Discharge home in stable condition Infant Feeding: {Baby feeding:23562} Infant Disposition:{CHL IP OB HOME WITH  ZOXWRU:04540} Discharge instruction: per After Visit Summary and Postpartum booklet. Activity: Advance as tolerated. Pelvic rest for 6 weeks.  Diet: {OB JWJX:91478295} Future Appointments: Future Appointments  Date Time Provider Department Center  06/04/2023  2:35 PM Sue Lush, FNP [facility]  06/06/2023  8:15 AM [facility] NURSE [facility]  06/06/2023  8:30 AM [facility] US5 [facility]  06/10/2023 10:15 AM [facility]-CHAIR 6 [facility]-IN[facility] None  06/11/2023  2:35 PM Venora Maples, MD [facility]  06/18/2023  1:15 PM Venora Maples, MD [facility]  06/25/2023  1:15 PM Sue Lush, FNP [facility]   Follow up Visit: Message sent to [facility] 11/17  Please schedule this patient for a In person postpartum visit in 6 weeks with the following provider: Any provider. Additional Postpartum F/U:Postpartum Depression checkup and 2 hour GTT  High risk pregnancy complicated by: GDM Delivery mode:  Vaginal, Spontaneous Anticipated Birth Control:   Considering POPs   06/02/2023 Sundra Aland, MD

## 2023-06-02 NOTE — H&P (Signed)
OBSTETRIC ADMISSION HISTORY AND PHYSICAL  Briana Lynch is a 27 y.o. female G79P0101 with IUP at [redacted]w[redacted]d (dated by 6 wk [place], Estimated Date of Delivery: 06/26/23) presenting following preterm vaginal delivery of infant at home.   Had contractions all of yesterday, was at home with her son, then around 0100, contraction severity increased, went to the bathroom and noticed a BBOW, so pushed once and delivered infant. She called EMS immediately then delivered placenta en route to hospital -- 0610.   She plans on breast feeding. She request POPs for birth control.  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   Diagnosed at an outside hospital with swabs in September 2016, second lesion May 24th 2017   Iron deficiency anemia 10/19/2014   PCOS (polycystic ovarian syndrome)    Prediabetes 08/20/2014   HA1C 6.0% at beginning of 2022 pregnancy     Suicide attempt (HCC) 10/23/2020   Urinary incontinence 12/11/2012   UTI (urinary tract infection)    Vitamin D deficiency 08/20/2014    Past Surgical History: Past Surgical History:  Procedure Laterality Date   CERVICAL CERCLAGE N/A 12/24/2022   Procedure: CERCLAGE CERVICAL;  Surgeon: Catalina Antigua, MD;  Location: [facility] LD ORS;  Service: Gynecology;  Laterality: N/A;   ESOPHAGOGASTRODUODENOSCOPY (EGD) WITH PROPOFOL N/A 10/24/2020   Procedure: ESOPHAGOGASTRODUODENOSCOPY (EGD) WITH PROPOFOL;  Surgeon: Beverley Fiedler, MD;  Location: [facility] ENDOSCOPY;  Service:  Gastroenterology;  Laterality: N/A;    Obstetrical History: OB History     Gravida  2   Para  1   Term      Preterm  1   AB      Living  1      SAB      IAB      Ectopic      Multiple  0   Live Births  1           Social History Social History   Socioeconomic History   Marital status: Single    Spouse name: Not on file   Number of children: Not on file   Years of education: Not on file   Highest education level: 12th grade  Occupational History   Occupation: Administrator, 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    Comment: Works from home  Tobacco Use   Smoking status: Every Day    Current packs/day: 0.50    Average packs/day: 0.5 packs/day for 8.9 years (4.4 ttl pk-yrs)    Types: Cigarettes    Start date: 2016   Smokeless tobacco: Never   Tobacco comments:    Dad smokes  Vaping Use   Vaping status: Never Used  Substance and Sexual Activity   Alcohol use: Not Currently    Alcohol/week: 0.0 standard drinks of alcohol   Drug use: Not Currently    Types: Marijuana    Comment: ?sometime this summer 2024   Sexual activity: Not Currently    Birth control/protection: None  Other Topics Concern  Not on file  Social History Narrative   2014:Sophomore in high school, taking AP classes, enjoys school manages basketball, football and track teams   Social Determinants of Health   Financial Resource Strain: Not on file  Food Insecurity: Unknown (01/04/2019)   Hunger Vital Sign    Worried About Running Out of Food in the Last Year: Patient declined    Ran Out of Food in the Last Year: Patient declined  Transportation Needs: Unknown (01/04/2019)   PRAPARE - Administrator, Civil Service (Medical): Patient declined    Lack of Transportation (Non-Medical): Patient declined  Physical Activity: Unknown (01/04/2019)   Exercise Vital Sign    Days of Exercise per Week: Patient declined    Minutes of Exercise per Session: Patient declined  Stress: Not on file  Social  Connections: Unknown (01/04/2019)   Social Connection and Isolation Panel [NHANES]    Frequency of Communication with Friends and Family: Patient declined    Frequency of Social Gatherings with Friends and Family: Patient declined    Attends Religious Services: Patient declined    Database administrator or Organizations: Patient declined    Attends Banker Meetings: Patient declined    Marital Status: Patient declined    Family History: Family History  Problem Relation Age of Onset   Diabetes Mother    Hypertension Mother    Healthy Father    Mental illness Sister     Allergies: Allergies  Allergen Reactions   Penicillins Other (See Comments)    Did it involve swelling of the face/tongue/throat, SOB, or low BP? No Did it involve sudden or severe rash/hives, skin peeling, or any reaction on the inside of your mouth or nose? No Did you need to seek medical attention at a hospital or doctor's office? No When did it last happen? Never      If all above answers are "NO", may proceed with cephalosporin use.  Unknown-pt's sister is allergic, pt NEVER had it    Medications Prior to Admission  Medication Sig Dispense Refill Last Dose   Accu-Chek Softclix Lancets lancets Use four times daily as instructed. 100 each 12    Blood Glucose Monitoring Suppl (ACCU-CHEK GUIDE) w/Device KIT 1 Device by Does not apply route in the morning, at noon, in the evening, and at bedtime. 1 kit 0    ferrous sulfate 325 (65 FE) MG EC tablet Take 1 tablet (325 mg total) by mouth every other day. 30 tablet 2    glucose blood test strip Use as instructed 100 each 12    Prenatal Vit-Fe Fumarate-FA (PRENATAL MULTIVITAMIN) TABS tablet Take 1 tablet by mouth daily.      valACYclovir (VALTREX) 500 MG tablet Take 1 tablet (500 mg total) by mouth 2 (two) times daily. 60 tablet 2      Review of Systems  All systems reviewed and negative except as stated in HPI.  Last menstrual period 09/09/2022, not  currently breastfeeding. General appearance: alert and cooperative Lungs: breathing comfortably on room air Heart: regular rate Abdomen: soft, non-tender; gravid Extremities: no edema of bilateral lower extremities Pelvic: no lacerations noted on exam, fundus firm  Prenatal labs: ABO, Rh: --/--/O POS (06/10 4098) Antibody: NEG (06/10 0948) Rubella: 1.26 (06/04 0942) RPR: Non Reactive (10/01 1509)  HBsAg: Negative (06/04 0942)  HIV: Non Reactive (10/01 1509)  GBS:    2 hr Glucola failed 1'  Genetic screening  low risk Anatomy US normal anatomy Last [place]: At  [redacted]w[redacted]d - cephalic presentation, EFW 2064 (67 %tile), AC 94%  Prenatal Transfer Tool  Maternal Diabetes: Yes:  Diabetes Type:  Diet controlled Genetic Screening: Normal Maternal Ultrasounds/Referrals: Normal Fetal Ultrasounds or other Referrals:  Referred to Materal Fetal Medicine  Maternal Substance Abuse:  Yes:  Type: Marijuana Significant Maternal Medications:  Meds include: Other: Acyclovir, Iron Significant Maternal Lab Results:  Other: GBS pending Number of Prenatal Visits:greater than 3 verified prenatal visits Other Comments:  None  No results found for this or any previous visit (from the past 24 hour(s)).  Patient Active Problem List   Diagnosis Date Noted   GDM (gestational diabetes mellitus) 05/27/2023   Abnormal GTT (glucose tolerance test) 04/17/2023   Alpha thalassemia silent carrier 01/23/2023   Cervical cerclage suture present in second trimester 01/14/2023   Obesity affecting pregnancy 12/20/2022   Supervision of high risk pregnancy in first trimester 12/18/2022   History of preterm delivery, currently pregnant 08/18/2021   Cervical insufficiency in pregnancy, antepartum, first trimester 08/17/2021   Anemia of pregnancy in third trimester 06/01/2021   Chronic post-traumatic stress disorder (PTSD) 01/04/2019   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 G2P0101 at [redacted]w[redacted]d here following delivery of infant at home.  #Postpartum care:  - Routine postpartum care  - Plans to breast feed   #A1GDM:  - AM CBG today (if fasting) vs tomorrow  - Will need 2h GTT  #Anemia of pregnancy:  - Will f/up HgB   #HSV2: on Valtrex, no lesions on brief exam (pt unable to tolerate full exam)   Sundra Aland, MD OB Fellow, Faculty Practice [facility], Center for [facility] 06/02/23 7:04 AM

## 2023-06-03 LAB — GLUCOSE, CAPILLARY: Glucose-Capillary: 97 mg/dL (ref 70–99)

## 2023-06-03 MED ORDER — ARIPIPRAZOLE 5 MG PO TABS
5.0000 mg | ORAL_TABLET | Freq: Every evening | ORAL | Status: DC
  Administered 2023-06-03: 5 mg via ORAL
  Filled 2023-06-03: qty 1

## 2023-06-03 MED ORDER — HYDROXYZINE HCL 25 MG PO TABS
25.0000 mg | ORAL_TABLET | Freq: Three times a day (TID) | ORAL | Status: DC | PRN
  Administered 2023-06-03: 25 mg via ORAL
  Filled 2023-06-03: qty 1

## 2023-06-03 NOTE — Progress Notes (Signed)
In to meet pt at 1929 and pt c/o abdominal cramping 8/10 despite tylenol at 1810. Pt able to have ibuprofen now, offered heat pack. Pt declined hot pack stating she is "too hot already." Pt requesting stronger pain medication. Called Lamont Snowball CNM. No new orders at this time, encouraged pt to try Kpad and ibuprofen now and take tylenol when able.

## 2023-06-03 NOTE — Progress Notes (Signed)
Pt updated on pain medication plan but still requesting something stronger for pain. Dr. Lucianne Muss called and updated on need to wait to give toradol until 0200. Dr. Lucianne Muss to place one time oxycodone order.

## 2023-06-03 NOTE — Clinical Social Work Maternal (Signed)
CLINICAL SOCIAL WORK MATERNAL/CHILD NOTE  Patient Details  Name: Briana Lynch MRN: [id number] Date of Birth: January 09, 1996  Date:  06/03/2023  Clinical Social Worker Initiating Note:  Enos Fling Date/Time: Initiated:  06/03/23/1503     Child's Name:  Briana Lynch   Biological Parents:  Mother Briana Lynch 15-Jun-1996)   Need for Interpreter:  None   Reason for Referral:  Behavioral Health Concerns, Current Substance Use/Substance Use During Pregnancy     Address:  [address] [place] [address]    Phone number:  [phone] (home)     Additional phone number:   Household Members/Support Persons (HM/SP):   Household Member/Support Person 1, Household Member/Support Person 2, Household Member/Support Person 3   HM/SP Name Relationship DOB or Age  HM/SP -1 Shayne Alken MOB's son 08-18-2021  HM/SP -2   MOB's parents    HM/SP -3   MOB's siblings    HM/SP -4        HM/SP -5        HM/SP -6        HM/SP -7        HM/SP -8          Natural Supports (not living in the home):  Immediate Family   Professional Supports: None   Employment: Environmental education officer   Type of Work: Lawyer for [employer]   Education:  [employer] graduate   Homebound arranged:    Surveyor, quantity Resources:  Medicaid   Other Resources:  Sales executive  , [employer]   Cultural/Religious Considerations Which May Impact Care:    Strengths:  Home prepared for child  , Merchandiser, retail, Ability to meet basic needs  , Understanding of illness   Psychotropic Medications:         Pediatrician:    Armed forces operational officer area  Pediatrician List:   Proofreader)  [place]    [place]      Pediatrician Fax Number:    Risk Factors/Current Problems:  Substance Use  , Mental Health Concerns     Cognitive State:  Able to Concentrate  , Alert  , Linear Thinking  , Insightful  , Goal Oriented     Mood/Affect:  Comfortable  ,  Interested  , Calm  , Relaxed     CSW Assessment: CSW received a consult for Suicide Attempt, anxiety and substance abuse during pregnancy. CSW met MOB at bedside to complete a full psychosocial assessment. CSW entered the room, introduced herself and explained the reason for the visit. MOB presented bonding/holding the infant and was polite, easy to engage, receptive to meeting with CSW, and appeared forthcoming.   CSW collected MOB's demographic information and she reported CPS hx with Ishmael Holter (08-18-2021) due to a missed eye appointment. MOB reported the missed eye appointment was assigned during the same time as his medical appointment. MOB reported the CPS social worker completed a few home visits and the case was closed shortly after.  CSW inquired about MOB's mental health history. MOB reported being diagnosed with anxiety and depression and was hospitalized twice (2020 and 2022 Suicide Attempt.) MOB reported being diagnosed with Bipolar while in [facility]; however her current doctors has not condone that diagnosis and she denies experiencing any symptoms related to Bipolar. MOB reported currently using the suicide hotline for free counselor support and plans to begin Abilify prior to discharge. MOB reported she has been feeling anxious  so she wanted to begin medication for support. MOB denied any PPD with her first pregnancy. CSW provided education regarding the baby blues period vs. perinatal mood disorders, discussed treatment and gave resources for mental health follow up if concerns arise.  CSW recommends self-evaluation during the postpartum time period using the New Mom Checklist from Postpartum Progress and encouraged MOB to contact a medical professional if symptoms are noted at any time.  CSW assessed for safety with MOB SI/HI/DV;MOB denied all.  CSW asked MOB does she receive support resources; MOB said yes(WIC and food stamps). MOB reported having all essential items for the infant  including a carseat, bassinet and crib for safe sleeping. CSW provided review of Sudden Infant Death Syndrome (SIDS) precautions.   CSW informed MOB due to [facility] during her pregnancy; the hospital will perform a UDS and CDS on the infant. If the screenings return with positive results a report to CPS will be made; MOB was understanding. MOB reported her last use was 2-3 months ago and prior to pregnancy she used [facility] recreationally.    CSW Plan/Description:    No Further Intervention Required/No Barriers to Discharge, Sudden Infant Death Syndrome (SIDS) Education, Perinatal Mood and Anxiety Disorder (PMADs) Education, Hospital Drug Screen Policy Information, Other Information/Referral to [facility], CSW Will Continue to Monitor Umbilical Cord Tissue Drug Screen Results and Make Report if Joanie Coddington, LCSW 06/03/2023, 3:16 PM

## 2023-06-03 NOTE — Progress Notes (Signed)
PROGRESS NOTE  Patient Name: Briana Lynch, female   DOB: Dec 03, 1995, 27 y.o.  MRN: [id number]  L2G4010 at [redacted]w[redacted]d admitted after precipitous delivery at home  Patient notes history of anxiety/depression. Per chart review and patient's history, previously had diagnosis of bipolar as a teen. 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 At this time, she would like to start a medication for her anxiety/depression. Discussed what she's taken in the past and what worked. Requests to restart Abilify. Discussed typically we start with SSRIs for anxiety/depression treatment, however since she has been on Abilify before and it's been effective, would be reasonable to restart this. Discussed less studies on Abilify and breastfeeding vs SSRI, however available data suggests it is compatible. Additionally talked about potential side effects of Abilify. Will add hydroxyzine on as a PRN. Patient met with SW earlier this afternoon to discuss resources.  A&P: - Start Abilify 5 mg nightly - Hydroxyzine 25 mg TID PRN anxiety  Joanne Gavel, MD FMOB Fellow, Faculty practice [facility], Center for [facility] 06/03/23  4:29 PM

## 2023-06-03 NOTE — Progress Notes (Signed)
Pt still c/o severe abdominal cramping 7-10, requesting stronger pain medication. Tylenol was given at 2218, 800 mg ibuprofen at 1947. Pt agreeable to try heat pack and Kpad but still states she is "too hot" for them. Called Dr. Lucianne Muss and return call received after MD finished with delivery. IM toradol ordered since pt does not have IV access.   Spoke with pharmacy after MD call. Since pt had ibuprofen at 1947, toradol cannot be scheduled and given until 0200.

## 2023-06-03 NOTE — Progress Notes (Signed)
POSTPARTUM PROGRESS NOTE  Post Partum Day 1  Subjective:  Briana Lynch is a 27 y.o. [id number] s/p SVD at home at [redacted]w[redacted]d.  She reports she is doing well. No acute events overnight. She denies any problems with ambulating, voiding or po intake. Denies nausea or vomiting.  Pain is moderately controlled -- better after Toradol and one time dose Oxicodone.  Lochia is equal to a period.  Objective: Blood pressure 109/60, pulse 87, temperature 98.1 F (36.7 C), temperature source Oral, resp. rate 20, last menstrual period 09/09/2022, SpO2 100%, unknown if currently breastfeeding.  Physical Exam:  General: alert, cooperative and no distress Chest: no respiratory distress Heart:regular rate, distal pulses intact Uterine Fundus: firm, appropriately tender DVT Evaluation: No calf swelling or tenderness Extremities: No edema Skin: warm, dry  Recent Labs    06/02/23 0958  HGB 9.0*  HCT 28.1*    Assessment/Plan: Briana Lynch is a 27 y.o. A5W0981 s/p SVD at [redacted]w[redacted]d   PPD#1 - Doing well  Routine postpartum care  A1GDM - CBG in AM 97, will recheck tomorrow AM  will need 2h GTT 6wk postpartum  Contraception: POPs Feeding: breast Dispo: Plan for discharge tomorrow.   LOS: 1 day   Sundra Aland, MD OB Fellow  06/03/2023, 8:43 AM

## 2023-06-04 ENCOUNTER — Encounter: Payer: MEDICAID | Admitting: Obstetrics and Gynecology

## 2023-06-04 ENCOUNTER — Ambulatory Visit (HOSPITAL_COMMUNITY): Payer: Self-pay

## 2023-06-04 LAB — GLUCOSE, CAPILLARY: Glucose-Capillary: 101 mg/dL — ABNORMAL HIGH (ref 70–99)

## 2023-06-04 MED ORDER — ARIPIPRAZOLE 5 MG PO TABS: 5.0000 mg | ORAL_TABLET | Freq: Every evening | ORAL | 3 refills | Status: DC

## 2023-06-04 MED ORDER — IBUPROFEN 600 MG PO TABS: 600.0000 mg | ORAL_TABLET | Freq: Four times a day (QID) | ORAL | 0 refills | Status: DC | PRN

## 2023-06-04 MED ORDER — HYDROXYZINE HCL 25 MG PO TABS: 25.0000 mg | ORAL_TABLET | Freq: Three times a day (TID) | ORAL | 0 refills | Status: DC | PRN

## 2023-06-04 NOTE — Lactation Note (Signed)
This note was copied from a baby's chart. Lactation Consultation Note  Patient Name: Girl Anouk Bilberry ZOXWR'U Date: 06/04/2023 Age:26 hours Reason for consult: Follow-up assessment;Late-preterm 34-36.6wks;Infant weight loss (7 % weight loss. LC updated the doc flow sheets per mom. Per mom baby has been latching , then supplementing, and post pumping with the DEBP.) LC spoke with Ian Malkin the rep for [employer] and moms medicaid is not accepted by them. LC sent a WIC referral today to [place]  Per mom  has one hands - free that works and not the other.  Maternal Data    Feeding Mother's Current Feeding Choice: Breast Milk and Formula Nipple Type: Nfant Standard Flow (white)  LATCH Score - last score 8     Lactation Tools Discussed/Used Tools: Pump;Flanges Flange Size: 24 Breast pump type: Double-Electric Breast Pump Pump Education: Milk Storage Pumped volume:  (milk is coming in)  Interventions Interventions: Breast feeding basics reviewed;DEBP;Education;LC Services brochure  Discharge Pump: Manual (LC spoke with  Ian Malkin the rep for [employer] and moms medicaid is not accepted by them. LC sent a WIC referral today to [place]) WIC Program: Yes  Consult Status Consult Status: Follow-up Date: 06/05/23 Follow-up type: In-patient    Matilde Sprang Greidy Sherard 06/04/2023, 4:12 PM

## 2023-06-04 NOTE — Lactation Note (Signed)
This note was copied from a baby's chart. Lactation Consultation Note  Patient Name: Briana Lynch [id number] Date: 06/04/2023 Age:26 hours Reason for consult: Follow-up assessment;Late-preterm 34-36.6wks;Infant weight loss (weight loss -6.76%) LC entered the room, MOB requested another hands free bra. LC fitted MOB and MOB decided to pump, while pumping infant became fussy, LC changed void and yellow seedy diaper. MOB is latching infant at the breast for 20 minutes and afterwards is offering 20-30 mls of 22 kcal formula. She has not been pumping today maybe once but plans to pump more. Infant was given the 7 mls of colostrum  that MOB pumped and afterwards mom will give infant 22 kcal formula. MOB knows to offer 28 mls or more per feeding  at 48-72 hours of life.   MOB knows that EBM is safe for 4 hours at room temperature whereas formula must be used within 1 hour once open.   MOB current plan: 1- Continue to BF infant 1st every 3 hours, and limit chest ( breast and bottle feeding ) 30 minutes or less. MOB will continue to follow LPTI feeding guidelines.  2- After latching infant at the breast supplement infant with EBM first and then formula on Day 2 (21 mls or more) per feeding. 3- MOB will continue to use DEBP every 3 hours for 15 minutes to help with establishing her milk supply.   Maternal Data    Feeding Mother's Current Feeding Choice: Breast Milk and Formula  LATCH Score                    Lactation Tools Discussed/Used Breast pump type: Double-Electric Breast Pump Pumping frequency: MOB will try pump every 3 hours for 15 minutes on inital setting. Pumped volume: 7 mL (Infant given 7 mls but MOB still expressing colostrum, with hands free bra)  Interventions    Discharge    Consult Status Consult Status: Follow-up Date: 06/04/23 Follow-up type: In-patient    Frederico Hamman 06/04/2023, 12:54 AM

## 2023-06-05 ENCOUNTER — Ambulatory Visit: Payer: MEDICAID

## 2023-06-06 ENCOUNTER — Ambulatory Visit: Payer: MEDICAID

## 2023-06-10 ENCOUNTER — Encounter: Payer: Self-pay | Admitting: Family Medicine

## 2023-06-10 ENCOUNTER — Ambulatory Visit: Payer: MEDICAID

## 2023-06-11 ENCOUNTER — Telehealth (HOSPITAL_COMMUNITY): Payer: Self-pay

## 2023-06-11 ENCOUNTER — Encounter: Payer: MEDICAID | Admitting: Family Medicine

## 2023-06-11 NOTE — Telephone Encounter (Signed)
06/11/2023 1852  Name: Briana Lynch MRN: [id number] DOB: [birth year]  Reason for Call:  Transition of Care Hospital Discharge Call  Contact Status: Patient Contact Status: Unable to contact No answer, No voicemail option Language assistant needed:          Follow-Up Questions:    Inocente Salles Postnatal Depression Scale:  In the Past 7 Days:    PHQ2-9 Depression Scale:     Discharge Follow-up:    Post-discharge interventions: NA  Signature  Signe Colt

## 2023-06-18 ENCOUNTER — Encounter: Payer: MEDICAID | Admitting: Family Medicine

## 2023-06-25 ENCOUNTER — Encounter: Payer: MEDICAID | Admitting: Obstetrics and Gynecology

## 2023-07-03 ENCOUNTER — Ambulatory Visit: Payer: MEDICAID | Admitting: Family Medicine

## 2023-07-03 ENCOUNTER — Encounter: Payer: Self-pay | Admitting: Family Medicine

## 2023-07-03 ENCOUNTER — Other Ambulatory Visit: Payer: Self-pay

## 2023-07-03 VITALS — BP 131/82 | HR 91 | Wt 240.0 lb

## 2023-07-03 DIAGNOSIS — Z1331 Encounter for screening for depression: Secondary | ICD-10-CM

## 2023-07-03 DIAGNOSIS — N939 Abnormal uterine and vaginal bleeding, unspecified: Secondary | ICD-10-CM

## 2023-07-03 DIAGNOSIS — F4312 Post-traumatic stress disorder, chronic: Secondary | ICD-10-CM

## 2023-07-03 MED ORDER — ARIPIPRAZOLE 5 MG PO TABS: 5.0000 mg | ORAL_TABLET | Freq: Every evening | ORAL | 12 refills | Status: AC

## 2023-07-03 MED ORDER — HYDROXYZINE HCL 25 MG PO TABS: 25.0000 mg | ORAL_TABLET | Freq: Three times a day (TID) | ORAL | 2 refills | Status: AC | PRN

## 2023-07-03 NOTE — Progress Notes (Signed)
Post Partum Visit Note  Briana Lynch is a 27 y.o. [id number] female who presents for a postpartum visit. She is 4 weeks postpartum following a normal spontaneous vaginal delivery.  I have fully reviewed the prenatal and intrapartum course. The delivery was at 36 gestational weeks.  Anesthesia: none. Postpartum course has been gone well. Baby is doing well. Baby is feeding by both breast and bottle - Similac Neosure. Bleeding thick, heavy lochia. Bowel function is normal. Bladder function is normal. Patient is not sexually active. Contraception method is none. Postpartum depression screening: negative.   Upstream - 07/03/23 1717       Pregnancy Intention Screening   Does the patient want to become pregnant in the next year? No    Does the patient's partner want to become pregnant in the next year? No    Would the patient like to discuss contraceptive options today? No      Contraception Wrap Up   Current Method Abstinence    End Method Abstinence    Contraception Counseling Provided Yes    How was the end contraceptive method provided? N/A           Heavy vaginal bleeding that started 2d ago. Reports soaking large pad. She had bleeding after delivery for ~3 weeks and then had no bleeding for 1 week and then started bleeding with clots. Denies fever, chills, foul odor. Denies abdominal pain.   The pregnancy intention screening data noted above was reviewed. Potential methods of contraception were discussed. The patient elected to proceed with Abstinence.    There are no preventive care reminders to display for this patient.  The following portions of the patient's history were reviewed and updated as appropriate: allergies, current medications, past family history, past medical history, past social history, past surgical history, and problem list.  Review of Systems Pertinent items are noted in HPI.  Objective:  BP 131/82   Pulse 91   Wt 240 lb (108.9 kg)   BMI 36.49 kg/m     General:  alert and cooperative   Breasts:  not indicated  Lungs: clear to auscultation bilaterally  Heart:  regular rate and rhythm, S1, S2 normal, no murmur, click, rub or gallop  Abdomen: soft, non-tender; bowel sounds normal; no masses,  no organomegaly   Wound NA  GU exam:  not indicated       Assessment:   1. Chronic post-traumatic stress disorder (PTSD) (Primary) - ARIPiprazole (ABILIFY) 5 MG tablet; Take 1 tablet (5 mg total) by mouth Nightly.  Dispense: 30 tablet; Refill: 12 - hydrOXYzine (ATARAX) 25 MG tablet; Take 1 tablet (25 mg total) by mouth 3 (three) times daily as needed for anxiety.  Dispense: 30 tablet; Refill: 2  2. Vaginal bleeding - US PELVIC COMPLETE WITH TRANSVAGINAL; Future  Normal postpartum exam.   Plan:   Essential components of care per ACOG recommendations:  1.  Mood and well being: Patient with negative depression screening today. Reviewed local resources for support.  - Patient tobacco use? No.   - hx of drug use? No.    2. Infant care and feeding:  -Patient currently breastmilk feeding? Yes. Reviewed importance of draining breast regularly to support lactation.  -Social determinants of health (SDOH) reviewed in EPIC. No concerns  3. Sexuality, contraception and birth spacing - Patient does not want a pregnancy in the next year.  Desired family size is 2 children.  - Reviewed reproductive life planning. Reviewed contraceptive methods based on pt  preferences and effectiveness.  Patient desired Abstinence today.   - Discussed birth spacing of 18 months  4. Sleep and fatigue -Encouraged family/partner/community support of 4 hrs of uninterrupted sleep to help with mood and fatigue  5. Physical Recovery  - Discussed patients delivery and complications. She describes her labor as good. - Patient had a Vaginal, no problems at delivery. Delivered at home.Patient had a  no  laceration. Perineal healing reviewed. Patient expressed understanding -  Patient has urinary incontinence? No. - Patient is safe to resume physical and sexual activity  6.  Health Maintenance - HM due items addressed Yes - Last pap smear  Diagnosis  Date Value Ref Range Status  05/25/2021   Final   - Negative for intraepithelial lesion or malignancy (NILM)   Pap smear not done at today's visit.  -Breast Cancer screening indicated? No.   7. Chronic Disease/Pregnancy Condition follow up: None  #Vaginal bleeding: likely period resuming. But delivered at home and placenta with EMS. Will get [place] to r/o retained POCs. Given ER precautions. [place] scheduled for tomorrow.  - PCP follow up  Future Appointments  Date Time Provider Department Center  07/05/2023  4:30 PM [facility] 1 [facility]     Federico Flake, MD [facility], [facility]

## 2023-07-05 ENCOUNTER — Ambulatory Visit (HOSPITAL_BASED_OUTPATIENT_CLINIC_OR_DEPARTMENT_OTHER): Payer: MEDICAID

## 2024-02-18 ENCOUNTER — Inpatient Hospital Stay (HOSPITAL_COMMUNITY)
Admission: AD | Admit: 2024-02-18 | Discharge: 2024-02-18 | Disposition: A | Discharge status: Home / Self Care | Payer: MEDICAID | Attending: Obstetrics and Gynecology | Admitting: Obstetrics and Gynecology

## 2024-02-18 DIAGNOSIS — E282 Polycystic ovarian syndrome: Secondary | ICD-10-CM | POA: Insufficient documentation

## 2024-02-18 DIAGNOSIS — N926 Irregular menstruation, unspecified: Secondary | ICD-10-CM

## 2024-02-18 DIAGNOSIS — N912 Amenorrhea, unspecified: Secondary | ICD-10-CM | POA: Diagnosis present

## 2024-02-18 DIAGNOSIS — Z3202 Encounter for pregnancy test, result negative: Secondary | ICD-10-CM | POA: Diagnosis not present

## 2024-02-18 LAB — POCT PREGNANCY, URINE: Preg Test, Ur: NEGATIVE

## 2024-02-18 NOTE — MAU Note (Signed)
 Briana Lynch is a 28 y.o. at Unknown here in MAU reporting: delivered 8 months ago has been breast feeding  has gotten periods until 2 months ago. Took preg  test last month(July) and it was negative . C/o abd pain off and on. None today. Wanted to make sure everything was ok. She had a cerclage with her last pregnancy .  LMP: 6/08/25/2023 Onset of complaint: 2 months Pain score: 0  Vitals:   02/18/24 1046  BP: 127/88  Pulse: 89  Resp: 18  Temp: 97.7 F (36.5 C)     FHT: n/a   Lab orders placed from triage: UPT(NEgative)

## 2024-02-18 NOTE — MAU Provider Note (Addendum)
 Event Date/Time   First Provider Initiated Contact with Patient 02/18/24 1056      S Ms. SARAHLYNN CISNERO is a 28 y.o. [phone] patient with PMH of PCOS who presents to MAU today with complaint of missing period.   She delivered her daughter in Nov and had regular periods until May. May period came late in June and she has not had a period since then. She does not desire another pregnancy. She is worried that the cerclage during her first pregnancy is causing symptoms. She missed a recently scheduled ultrasound to evaluate cerclage due to not having childcare. She notes occasional intermittent abdominal pain, but her primary concern is the missed period.  Pregnancy test in July was negative.  O BP 127/88   Pulse 89   Temp 97.7 F (36.5 C)   Resp 18   Ht 5' 8 (1.727 m)   Wt 131.1 kg   LMP 12/23/2023   BMI 43.94 kg/m  Physical Exam Constitutional:      General: She is not in acute distress.    Appearance: She is well-developed. She is obese.  Pulmonary:     Effort: Pulmonary effort is normal. No respiratory distress.  Abdominal:     General: Bowel sounds are normal.     Palpations: Abdomen is soft.     Tenderness: There is no abdominal tenderness. There is no guarding.  Skin:    General: Skin is warm and dry.  Neurological:     Mental Status: She is alert.    Urine pregnancy hcg test: negative  A Medical screening exam complete Missed Period  P Discharge from MAU in stable condition Patient given the option of transfer to [facility] for further evaluation or seek care in outpatient facility of choice. She declined evaluation in the [facility], and will follow up with her OB-GYN.  List of options for follow-up given.  Patient may return to MAU as needed   Trudy Leeroy NOVAK, MD 02/18/2024 11:28 AM

## 2024-02-18 NOTE — Discharge Instructions (Signed)

## 2024-03-20 ENCOUNTER — Ambulatory Visit (HOSPITAL_COMMUNITY)
Admission: EM | Admit: 2024-03-20 | Discharge: 2024-03-20 | Disposition: A | Discharge status: Home / Self Care | Payer: MEDICAID | Attending: Emergency Medicine | Admitting: Emergency Medicine

## 2024-03-20 DIAGNOSIS — Z3202 Encounter for pregnancy test, result negative: Secondary | ICD-10-CM | POA: Diagnosis not present

## 2024-03-20 DIAGNOSIS — M545 Low back pain, unspecified: Secondary | ICD-10-CM

## 2024-03-20 LAB — POCT URINE PREGNANCY: Preg Test, Ur: NEGATIVE

## 2024-03-20 MED ORDER — IBUPROFEN 800 MG PO TABS: 800.0000 mg | ORAL_TABLET | Freq: Three times a day (TID) | ORAL | 0 refills | Status: DC

## 2024-03-20 NOTE — Discharge Instructions (Addendum)
 Ibuprofen  800 mg alternated with tylenol  650 mg every 4 hours Apply lidocaine  patch or icyhot/biofreeze to the low back  Please follow up with orthopedics if your pain is not improving Please go to the emergency department if symptoms worsen.

## 2024-03-20 NOTE — ED Provider Notes (Signed)
 [facility]    CSN: [id number] Arrival date & time: 03/20/24  1523      History   Chief Complaint Chief Complaint  Patient presents with   Back Pain    HPI Briana Lynch is a 28 y.o. female.  Here with 1 week history of low back pain Worsening in the last 2 days Currently rating 9/10 Denies direct injury or trauma. No falls.  Denies weakness or paresthesias, bladder or bowel dysfunction  She has not attempted any interventions  LMP is > 9 months ago, since before her pregnancy. Had her baby in November 2024.  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   Diagnosed at an outside hospital with swabs in September 2016, second lesion May 24th 2017   Iron  deficiency anemia 10/19/2014   PCOS (polycystic ovarian syndrome)    Prediabetes 08/20/2014   HA1C 6.0% at beginning of 2022 pregnancy     Suicide attempt (HCC) 10/23/2020   Urinary incontinence 12/11/2012   UTI (urinary tract infection)    Vitamin D  deficiency 08/20/2014    Patient Active Problem List   Diagnosis Date Noted   Alpha thalassemia silent carrier 01/23/2023   Anemia of pregnancy in third trimester 06/01/2021   Chronic post-traumatic stress disorder (PTSD) 01/04/2019   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    Past Surgical History:  Procedure Laterality Date   CERVICAL CERCLAGE N/A 12/24/2022   Procedure: CERCLAGE CERVICAL;  Surgeon: Alger Gong, MD;  Location: [facility];  Service: Gynecology;  Laterality: N/A;    ESOPHAGOGASTRODUODENOSCOPY (EGD) WITH PROPOFOL  N/A 10/24/2020   Procedure: ESOPHAGOGASTRODUODENOSCOPY (EGD) WITH PROPOFOL ;  Surgeon: Albertus Gordy HERO, MD;  Location: [facility] ENDOSCOPY;  Service: Gastroenterology;  Laterality: N/A;    OB History     Gravida  2   Para  2   Term      Preterm  2   AB      Living  2      SAB      IAB      Ectopic      Multiple  0   Live Births  2            Home Medications    Prior to Admission medications   Medication Sig Start Date End Date Taking? Authorizing Provider  ibuprofen  (ADVIL ) 800 MG tablet Take 1 tablet (800 mg total) by mouth 3 (three) times daily. 03/20/24  Yes Navarre Diana, Asberry, PA-C  ARIPiprazole  (ABILIFY ) 5 MG tablet Take 1 tablet (5 mg total) by mouth Nightly. 07/03/23   Eldonna Suzen Octave, MD  ferrous sulfate  325 (65 FE) MG EC tablet Take 1 tablet (325 mg total) by mouth every other day. 12/19/22 06/17/23  Lola Donnice HERO, MD  hydrOXYzine  (ATARAX ) 25 MG tablet Take 1 tablet (25 mg total) by mouth 3 (three) times daily as needed for anxiety. 07/03/23   Eldonna Suzen Octave, MD  Prenatal Vit-Fe Fumarate-FA (PRENATAL MULTIVITAMIN) TABS tablet Take 1 tablet by mouth daily. Patient not  taking: Reported on 07/03/2023    [provider]    Family History Family History  Problem Relation Age of Onset   Diabetes Mother    Hypertension Mother    Healthy Father    Mental illness Sister     Social History Social History   Tobacco Use   Smoking status: Every Day    Current packs/day: 0.50    Average packs/day: 0.5 packs/day for 9.7 years (4.8 ttl pk-yrs)    Types: Cigarettes    Start date: 2016   Smokeless tobacco: Never   Tobacco comments:    Dad smokes  Vaping Use   Vaping status: Never Used  Substance Use Topics   Alcohol use: Not Currently    Alcohol/week: 0.0 standard drinks of alcohol   Drug use: Not Currently    Types: Marijuana    Comment: ?sometime this summer 2024     Allergies    Penicillins   Review of Systems Review of Systems  Musculoskeletal:  Positive for back pain.   As per HPI  Physical Exam Triage Vital Signs ED Triage Vitals  Encounter Vitals Group     BP 03/20/24 1651 117/62     Girls Systolic BP Percentile --      Girls Diastolic BP Percentile --      Boys Systolic BP Percentile --      Boys Diastolic BP Percentile --      Pulse Rate 03/20/24 1651 76     Resp 03/20/24 1651 16     Temp 03/20/24 1651 98.3 F (36.8 C)     Temp Source 03/20/24 1651 Oral     SpO2 03/20/24 1651 95 %     Weight --      Height --      Head Circumference --      Peak Flow --      Pain Score 03/20/24 1652 10     Pain Loc --      Pain Education --      Exclude from Growth Chart --    No data found.  Updated Vital Signs BP 117/62 (BP Location: Right Arm)   Pulse 76   Temp 98.3 F (36.8 C) (Oral)   Resp 16   SpO2 95%   Breastfeeding Yes    Physical Exam Vitals and nursing note reviewed.  Constitutional:      General: She is not in acute distress. HENT:     Mouth/Throat:     Mouth: Mucous membranes are moist.     Pharynx: Oropharynx is clear.  Eyes:     Extraocular Movements: Extraocular movements intact.     Conjunctiva/sclera: Conjunctivae normal.     Pupils: Pupils are equal, round, and reactive to light.  Cardiovascular:     Rate and Rhythm: Normal rate and regular rhythm.     Pulses: Normal pulses.     Heart sounds: Normal heart sounds.  Pulmonary:     Effort: Pulmonary effort is normal.     Breath sounds: Normal breath sounds.  Musculoskeletal:        General: Normal range of motion.     Cervical back: Normal range of motion. No rigidity or tenderness.     Comments: No bony tenderness C-L spine. Full ROM of extremities.  Skin:    General: Skin is warm and dry.  Neurological:     General: No focal deficit present.     Mental Status: She is alert and oriented to person, place, and time.  Cranial Nerves: Cranial nerves 2-12 are  intact. No cranial nerve deficit.     Sensory: Sensation is intact.     Motor: Motor function is intact. No weakness.     Coordination: Coordination is intact.     Gait: Gait is intact.     Comments: Strength 5/5. Sensation intact throughout      UC Treatments / Results  Labs (all labs ordered are listed, but only abnormal results are displayed) Labs Reviewed  POCT URINE PREGNANCY    EKG  Radiology No results found.  Procedures Procedures (including critical care time)  Medications Ordered in UC Medications - No data to display  Initial Impression / Assessment and Plan / UC Course  I have reviewed the triage vital signs and the nursing notes.  Pertinent labs & imaging results that were available during my care of the patient were reviewed by me and considered in my medical decision making (see chart for details).  Stable vitals, neurologically intact. No red flags.  Negative UPT today IM toradol  offered and patient declines. She also declines oral ibuprofen  dose at this time.  Not recommending muscle relaxer at this time given lack of evidence for safety with breastfeeding.  I have discussed with patient the safe medications that will be helpful to reduce her pain, especially as she as not attempted any intervention yet. She then asks if I can fix her pain. We discussed again the medications and supportive treatments I have recommended for her symptoms. I have recommended orthopedic follow up if pain is persisting.  Patient asked for a back brace; discussed this is not something the urgent care uses.  Supportive care is detailed again. Orthopedic information is given. Strict ED precautions for severe pain or worsening of symptoms.  Final Clinical Impressions(s) / UC Diagnoses   Final diagnoses:  Acute midline low back pain without sciatica     Discharge Instructions      Ibuprofen  800 mg alternated with tylenol  650 mg every 4 hours Apply lidocaine  patch or  icyhot/biofreeze to the low back  Please follow up with orthopedics if your pain is not improving Please go to the emergency department if symptoms worsen.     ED Prescriptions     Medication Sig Dispense Auth. Provider   ibuprofen  (ADVIL ) 800 MG tablet Take 1 tablet (800 mg total) by mouth 3 (three) times daily. 21 tablet Sammy Douthitt, Asberry, PA-C      PDMP not reviewed this encounter.   Jeryl Asberry RIGGERS 03/20/24 8094

## 2024-03-20 NOTE — ED Triage Notes (Signed)
 Pt c/o lower back pain x1wk and became worse in past 2 days. States thinks she bent over wrong. Denies injury or taken meds.

## 2024-03-21 ENCOUNTER — Encounter (HOSPITAL_COMMUNITY): Payer: Self-pay | Admitting: *Deleted

## 2024-03-21 ENCOUNTER — Emergency Department (HOSPITAL_COMMUNITY)
Admission: EM | Admit: 2024-03-21 | Discharge: 2024-03-21 | Disposition: A | Discharge status: Home / Self Care | Payer: MEDICAID | Attending: Emergency Medicine | Admitting: Emergency Medicine

## 2024-03-21 ENCOUNTER — Emergency Department (HOSPITAL_COMMUNITY): Payer: MEDICAID

## 2024-03-21 ENCOUNTER — Other Ambulatory Visit: Payer: Self-pay

## 2024-03-21 DIAGNOSIS — M62830 Muscle spasm of back: Secondary | ICD-10-CM | POA: Diagnosis not present

## 2024-03-21 DIAGNOSIS — M545 Low back pain, unspecified: Secondary | ICD-10-CM | POA: Diagnosis present

## 2024-03-21 LAB — PREGNANCY, URINE: Preg Test, Ur: NEGATIVE

## 2024-03-21 MED ORDER — CYCLOBENZAPRINE HCL 10 MG PO TABS
5.0000 mg | ORAL_TABLET | Freq: Once | ORAL | Status: AC
  Administered 2024-03-21: 5 mg via ORAL
  Filled 2024-03-21: qty 1

## 2024-03-21 MED ORDER — NAPROXEN 375 MG PO TABS: 375.0000 mg | ORAL_TABLET | Freq: Two times a day (BID) | ORAL | 0 refills | Status: AC

## 2024-03-21 MED ORDER — CYCLOBENZAPRINE HCL 10 MG PO TABS: 5.0000 mg | ORAL_TABLET | Freq: Two times a day (BID) | ORAL | 0 refills | Status: AC | PRN

## 2024-03-21 NOTE — ED Notes (Signed)
 Patient transported to X-ray

## 2024-03-21 NOTE — Discharge Instructions (Signed)
 You Xray is normal  You are having back spasm. ### Acute Back Spasm Care     **What is an acute back spasm?**      An acute back spasm is a sudden, painful tightening of the muscles in your back. This is a common problem and usually gets better over time. Most people start to feel much better within a few weeks.[1][2]      **What treatments are being used?**      - **Naproxen :** This is a nonsteroidal anti-inflammatory drug (NSAID) that helps reduce pain and swelling. It is usually taken as 500 mg twice a day, but always follow the instructions given by your doctor.[3]      - **Cyclobenzaprine :** This is a muscle relaxant that can help with muscle tightness and discomfort. It is often taken as 5 mg every 8 hours as needed, but only for a short period. Use the lowest dose that helps your symptoms, and do not take more than prescribed.[3]      **What else can help?**      - **Stay active:** Try to keep moving and do your normal activities as much as you can, even if you have some pain. Bed rest is not recommended.[1][2]      - **Heat:** Using a heating pad or warm pack on your back may help relieve pain.[1][2]      - **Physical therapy:** Your doctor recommends follow-up with a physical therapist. Physical therapy can teach you exercises and stretches to help your back heal and prevent future problems.[1]      - **Massage, acupuncture, or spinal manipulation:** Some people find these helpful, but the evidence is not as strong as for other treatments.[1]      **What should you watch out for?**      - **Side effects of naproxen :** These can include stomach upset, heartburn, or, rarely, kidney problems. Take naproxen  with food and let your doctor know if you have a history of stomach ulcers, kidney disease, or are taking blood thinners.[1]      - **Side effects of cyclobenzaprine :** This medicine can cause drowsiness, dry mouth, or dizziness. Do not drive or operate heavy machinery until you  know how it affects you.[1]      - **Serious symptoms:** If you have numbness, tingling, weakness in your legs, loss of bladder or bowel control, or severe pain that does not improve, contact your doctor right away.      **What is the outlook?**      Most people with acute back spasm get much better within a few weeks, even without medicine. Medicines like naproxen  and cyclobenzaprine  can help with pain, but they do not speed up recovery. Physical therapy and staying active are important for long-term improvement and prevention.[1][3][2]      **Follow-up**      - Schedule a visit with your primary care doctor to check your progress.      - Attend physical therapy as recommended.      **Questions?**      If you have any questions about your medicines or your recovery, contact your healthcare provider.      ### References  1. Noninvasive Treatments for Acute, Subacute, and Chronic Low Back Pain: A Clinical Practice Guideline From the [employer]. Qaseem A, Wilt TJ, McLean RM, et al. Annals of Internal Medicine. 2017;166(7):514-530. doi:10.7326/M16-2367. 2. Nonspecific Low Back Pain. Chiarotto A, Koes BW. The [place] Journal of Medicine. 2022;386(18):1732-1740. doi:10.1056/NEJMcp2032396. 3. Naproxen  With Cyclobenzaprine , Oxycodone /Acetaminophen ,  or Placebo for Treating Acute Low Back Pain: A Randomized Clinical Trial. Friedman BW, Dym AA, Davitt M, et al. JAMA. 2015;314(15):1572-80. doi:10.1001/jama.7984.86956.

## 2024-03-21 NOTE — ED Triage Notes (Signed)
 Pt c/o back pain, denies injury. Woke up this morning with lower back pain, nonradiating.

## 2024-03-21 NOTE — ED Triage Notes (Signed)
 Patient walked into the ER stating she broke her back. Rates pain 10/10.

## 2024-03-21 NOTE — ED Provider Notes (Signed)
 [place] EM[facility] DEPARTMENT AT [facility] Provider Note   CSN: [id number] Arrival date & time: 03/21/24  1910     Patient presents with: Back Pain   Briana Lynch is a 28 y.o. female who presents emergency department with a chief complaint of lumbar pain.  Patient reports that she has had pain in her back for the past several days.  She thinks she might of broken my back.  She denies any injury.  She does have a 13-year-old and a 93-month-old that she is carrying and lifting a lot.  She states the pain is worse when she tries to bend over.  She notices that her stance is abnormal which is why she thinks her back might be broken.  She denies saddle anesthesia loss of bowel or bladder control weakness in the lower extremities.    Back Pain      Prior to Admission medications   Medication Sig Start Date End Date Taking? Authorizing Provider  ARIPiprazole  (ABILIFY ) 5 MG tablet Take 1 tablet (5 mg total) by mouth Nightly. 07/03/23   Eldonna Suzen Octave, MD  ferrous sulfate  325 (65 FE) MG EC tablet Take 1 tablet (325 mg total) by mouth every other day. 12/19/22 06/17/23  Lola Donnice HERO, MD  hydrOXYzine  (ATARAX ) 25 MG tablet Take 1 tablet (25 mg total) by mouth 3 (three) times daily as needed for anxiety. 07/03/23   Eldonna Suzen Octave, MD  ibuprofen  (ADVIL ) 800 MG tablet Take 1 tablet (800 mg total) by mouth 3 (three) times daily. 03/20/24   Rising, Asberry, PA-C  Prenatal Vit-Fe Fumarate-FA (PRENATAL MULTIVITAMIN) TABS tablet Take 1 tablet by mouth daily. Patient not taking: Reported on 07/03/2023    [provider]    Allergies: Penicillins    Review of Systems  Musculoskeletal:  Positive for back pain.    Updated Vital Signs BP 121/77 (BP Location: Left Arm)   Pulse 78   Temp 98.2 F (36.8 C)   Resp 17   LMP 12/31/2023 (Approximate)   SpO2 98%   Physical Exam Vitals and nursing note reviewed.  Constitutional:      General: She is not in acute  distress.    Appearance: She is well-developed. She is not diaphoretic.  HENT:     Head: Normocephalic and atraumatic.     Right Ear: External ear normal.     Left Ear: External ear normal.     Nose: Nose normal.     Mouth/Throat:     Mouth: Mucous membranes are moist.  Eyes:     General: No scleral icterus.    Conjunctiva/sclera: Conjunctivae normal.  Cardiovascular:     Rate and Rhythm: Normal rate and regular rhythm.     Heart sounds: Normal heart sounds. No murmur heard.    No friction rub. No gallop.  Pulmonary:     Effort: Pulmonary effort is normal. No respiratory distress.     Breath sounds: Normal breath sounds.  Abdominal:     General: Bowel sounds are normal. There is no distension.     Palpations: Abdomen is soft. There is no mass.     Tenderness: There is no abdominal tenderness. There is no guarding.  Musculoskeletal:     Cervical back: Normal range of motion.     Comments: No midline spinal tenderness.  Obvious spasm of the right lumbar paraspinals.  No tenderness over the SI joints, full range of motion of the back worse with right lateral flexion and forward  flexion.  Skin:    General: Skin is warm and dry.  Neurological:     Mental Status: She is alert and oriented to person, place, and time.  Psychiatric:        Behavior: Behavior normal.     (all labs ordered are listed, but only abnormal results are displayed) Labs Reviewed - No data to display  EKG: None  Radiology: No results found.   Procedures   Medications Ordered in the ED - No data to display                                  Medical Decision Making Amount and/or Complexity of Data Reviewed Labs: ordered. Radiology: ordered.  Risk Prescription drug management.   I personally visualized and interpreted the images using our PACS system. Acute findings include:  Lumbar film negative   Patient with back pain.  No neurological deficits and normal neuro exam.  Patient can walk but  states is painful.  No loss of bowel or bladder control.  No concern for cauda equina.  No fever, night sweats, weight loss, h/o cancer, IVDU.  RICE protocol and pain medicine indicated and discussed with patient.       Final diagnoses:  None    ED Discharge Orders     None          Arloa Chroman, DEVONNA 03/21/24 2139    Randol Simmonds, MD 03/22/24 [phone]
